# Patient Record
Sex: Female | Born: 1970 | Race: White | Hispanic: No | Smoking: Never smoker
Health system: Southern US, Community
[De-identification: ages and names within clinical notes are randomized; demographics above are authoritative.]

## PROBLEM LIST (undated history)

## (undated) DIAGNOSIS — K219 Gastro-esophageal reflux disease without esophagitis: Secondary | ICD-10-CM

## (undated) DIAGNOSIS — G4733 Obstructive sleep apnea (adult) (pediatric): Secondary | ICD-10-CM

## (undated) DIAGNOSIS — F32A Depression, unspecified: Secondary | ICD-10-CM

## (undated) DIAGNOSIS — F329 Major depressive disorder, single episode, unspecified: Secondary | ICD-10-CM

## (undated) DIAGNOSIS — K7460 Unspecified cirrhosis of liver: Secondary | ICD-10-CM

## (undated) DIAGNOSIS — M1990 Unspecified osteoarthritis, unspecified site: Secondary | ICD-10-CM

## (undated) DIAGNOSIS — R51 Headache: Secondary | ICD-10-CM

## (undated) DIAGNOSIS — I872 Venous insufficiency (chronic) (peripheral): Secondary | ICD-10-CM

## (undated) DIAGNOSIS — D649 Anemia, unspecified: Secondary | ICD-10-CM

## (undated) DIAGNOSIS — Z5320 Procedure and treatment not carried out because of patient's decision for unspecified reasons: Secondary | ICD-10-CM

## (undated) HISTORY — DX: Depression, unspecified: F32.A

## (undated) HISTORY — PX: HERNIA REPAIR: SHX51

## (undated) HISTORY — DX: Major depressive disorder, single episode, unspecified: F32.9

## (undated) HISTORY — DX: Obstructive sleep apnea (adult) (pediatric): G47.33

## (undated) HISTORY — DX: Morbid (severe) obesity due to excess calories: E66.01

## (undated) HISTORY — DX: Venous insufficiency (chronic) (peripheral): I87.2

## (undated) HISTORY — DX: Unspecified cirrhosis of liver: K74.60

## (undated) HISTORY — DX: Unspecified osteoarthritis, unspecified site: M19.90

## (undated) HISTORY — DX: Procedure and treatment not carried out because of patient's decision for unspecified reasons: Z53.20

---
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
## 1991-05-05 HISTORY — PX: UMBILICAL HERNIA REPAIR: SHX196

## 1991-05-05 HISTORY — PX: CHOLECYSTECTOMY: SHX55

## 1996-05-04 HISTORY — PX: TUBAL LIGATION: SHX77

## 1997-05-04 HISTORY — PX: GASTRIC BYPASS: SHX52

## 2012-03-12 ENCOUNTER — Encounter (HOSPITAL_BASED_OUTPATIENT_CLINIC_OR_DEPARTMENT_OTHER): Payer: Self-pay | Admitting: Emergency Medicine

## 2012-03-12 ENCOUNTER — Emergency Department (HOSPITAL_BASED_OUTPATIENT_CLINIC_OR_DEPARTMENT_OTHER)
Admission: EM | Admit: 2012-03-12 | Discharge: 2012-03-12 | Disposition: A | Discharge status: Home / Self Care | Payer: BC Managed Care – PPO | Attending: Emergency Medicine | Admitting: Emergency Medicine

## 2012-03-12 DIAGNOSIS — S025XXA Fracture of tooth (traumatic), initial encounter for closed fracture: Secondary | ICD-10-CM | POA: Insufficient documentation

## 2012-03-12 DIAGNOSIS — Y929 Unspecified place or not applicable: Secondary | ICD-10-CM | POA: Insufficient documentation

## 2012-03-12 DIAGNOSIS — K029 Dental caries, unspecified: Secondary | ICD-10-CM | POA: Insufficient documentation

## 2012-03-12 DIAGNOSIS — Y939 Activity, unspecified: Secondary | ICD-10-CM | POA: Insufficient documentation

## 2012-03-12 DIAGNOSIS — X58XXXA Exposure to other specified factors, initial encounter: Secondary | ICD-10-CM | POA: Insufficient documentation

## 2012-03-12 MED ORDER — HYDROCODONE-ACETAMINOPHEN 5-325 MG PO TABS: 1.0000 | ORAL_TABLET | Freq: Three times a day (TID) | ORAL | Status: DC | PRN

## 2012-03-12 NOTE — ED Notes (Signed)
Pt presents to ED today with left lower dental pain for the last 5 days.  Pt states pain radiates to ear and jaw.  Pt has Primary dentist and did not call or request appt.

## 2012-03-12 NOTE — ED Provider Notes (Signed)
History     CSN: [id number]  Arrival date & time 03/12/12  1028   First MD Initiated Contact with Patient 03/12/12 1121      Chief Complaint  Patient presents with  . Dental Pain    (Consider location/radiation/quality/duration/timing/severity/associated sxs/prior treatment) HPI The patient presents with concerns of ongoing facial pain.  The pain began approximately one week ago.  Since onset there has been pain in her left lower face.  The pain is dull, severe, worse with chewing.  No relief with OTC medication.  Patient denies any concurrent fevers, chills, nausea, vomiting. She notes that she has a fractured tooth in the area, has not been able to obtain dental followup yet. No past medical history on file.  Past Surgical History  Procedure Date  . Cholecystectomy   . Cyst excision   . Cesarean section   . Hernia repair   . Tonsillectomy     No family history on file.  History  Substance Use Topics  . Smoking status: Not on file  . Smokeless tobacco: Not on file  . Alcohol Use:     OB History    Grav Para Term Preterm Abortions TAB SAB Ect Mult Living                  Review of Systems  All other systems reviewed and are negative.    Allergies  Review of patient's allergies indicates no known allergies.  Home Medications  No current outpatient prescriptions on file.  BP 127/75  Pulse 78  Temp 98.5 F (36.9 C) (Oral)  Resp 18  Ht 5\' 5"  (1.651 m)  Wt 239 lb (108.41 kg)  BMI 39.77 kg/m2  SpO2 99%  LMP 03/06/2012  Physical Exam  Nursing note and vitals reviewed. Constitutional: She is oriented to person, place, and time. She appears well-developed and well-nourished. No distress.  HENT:  Head: Normocephalic and atraumatic. No trismus in the jaw.  Mouth/Throat: Uvula is midline. No oral lesions. Dental caries present. No dental abscesses or uvula swelling.    Eyes: Conjunctivae normal and EOM are normal.  Cardiovascular: Normal rate and regular  rhythm.   Pulmonary/Chest: Effort normal and breath sounds normal. No stridor. No respiratory distress.  Abdominal: She exhibits no distension.  Musculoskeletal: She exhibits no edema.  Neurological: She is alert and oriented to person, place, and time. No cranial nerve deficit.  Skin: Skin is warm and dry.  Psychiatric: She has a normal mood and affect.    ED Course  Procedures (including critical care time)  Labs Reviewed - No data to display No results found.   No diagnosis found.    MDM  This generally well-appearing female presents with concerns of ongoing dental pain.  Absent fever, evidence of early infection she was not started on antibiotics, but was provided analgesics, references for dental followup expeditiously definitively treat her lesion.    Gerhard Munch, MD 03/12/12 1143

## 2012-03-12 NOTE — ED Notes (Signed)
Left lower dental pain for several days.  Pain is worsening.

## 2013-06-27 ENCOUNTER — Ambulatory Visit (INDEPENDENT_AMBULATORY_CARE_PROVIDER_SITE_OTHER): Payer: BC Managed Care – PPO | Admitting: General Surgery

## 2013-06-27 ENCOUNTER — Encounter (INDEPENDENT_AMBULATORY_CARE_PROVIDER_SITE_OTHER): Payer: Self-pay | Admitting: General Surgery

## 2013-06-27 VITALS — BP 120/80 | HR 80 | Temp 98.4°F | Resp 14 | Ht 64.0 in | Wt 279.4 lb

## 2013-06-27 DIAGNOSIS — K432 Incisional hernia without obstruction or gangrene: Secondary | ICD-10-CM

## 2013-06-27 NOTE — Patient Instructions (Signed)
Your physical exam confirms that you have an upper abdominal central incisional hernia.  Most likely you should eventually have an operation to repair this with mesh.  Because of your obesity, You are at increased risk, and so we want to make sure we plan this very safely.  You'll be scheduled for a CT scan of the abdomen and pelvis  We are going to obtain your operating room records from [facility] on your gastric bypass and gallbladder surgery  You are strongly advised to establish yourself with a primary care physician to review your medical problems and be sure that they are stabilized  For your constipation I recommended that you drink 6 glasses of water a day and take 4 Metamucil capsules twice a day. If that does not work then [employer] Financialyou'll need to take MiraLAX twice a day.  You will be given an appointment to return to see Dr. Derrell LollingIngram in 3 or 4 weeks after all this is done.

## 2013-06-27 NOTE — Progress Notes (Addendum)
Patient ID: Christina Castro, female   DOB: 1970-12-26, 43 y.o.   MRN: [id number]  Chief Complaint  Patient presents with  . New Evaluation    eval abd hernia    HPI Christina Castro is a 43 y.o. female.  She is referred to me by Dr. Sherian Rein, her gynecologist, for evaluation of an upper abdominal incisional hernia.     The patient has a history of morbid obesity, and is still actually morbidly obese. She states that she used to weigh 400 pounds, now 279 lbs.    She had some type of gastric bypass in [facility] in 1999, details unknown. She had a laparoscopic cholecystectomy and repair of umbilical hernia at [facility] in 1993. She has had 3 cesarean sections. Her weight is stable. She is constipated. She occasionally has some reflux and low-volume vomiting. She notices a progressive bulge in her upper abdomen but it has never been incarcerated. There has been no GI evaluation. There's been no imaging studies.  Comorbidities include morbid obesity, multiple bowel surgeries as listed above. She has never been a smoker. She drinks alcohol rarely. Her daughter is with her today. 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  Family history is notable for breast cancer in mother and breast cancer in maternal grandmother. Uncle had primary malignant neoplasm of prostate and also along neoplasm. Sister may have had ovarian neoplasm but details are unknown. HPI  History reviewed. No pertinent past medical history.  Past Surgical History  Procedure Laterality Date  . Cholecystectomy    . Cyst excision    . Cesarean section    . Hernia repair    . Tonsillectomy    . Gastric bypass      Family History  Problem Relation Age of Onset  . Cancer Mother     breast  . Cancer Father     prostate  . Cancer Sister     ovarian  . Cancer Paternal Uncle     prostate  . Cancer Maternal Grandmother     breast  . Cancer Paternal Grandfather     prostate  . Cancer Sister    ovarian    Social History History  Substance Use Topics  . Smoking status: Never Smoker   . Smokeless tobacco: Never Used  . Alcohol Use: 0.6 oz/week    1 Glasses of wine per week     Comment: monthly    No Known Allergies  No current outpatient prescriptions on file.   No current facility-administered medications for this visit.    Review of Systems Review of Systems  Constitutional: Negative for fever, chills and unexpected weight change.  HENT: Negative for congestion, hearing loss, sore throat, trouble swallowing and voice change.   Eyes: Negative for visual disturbance.  Respiratory: Negative for cough and wheezing.   Cardiovascular: Negative for chest pain, palpitations and leg swelling.  Gastrointestinal: Positive for vomiting and abdominal pain. Negative for nausea, diarrhea, constipation, blood in stool, abdominal distention and anal bleeding.       GERD  Genitourinary: Negative for hematuria, vaginal bleeding and difficulty urinating.  Musculoskeletal: Negative for arthralgias.  Skin: Negative for rash and wound.  Neurological: Negative for seizures, syncope and headaches.  Hematological: Negative for adenopathy. Does not bruise/bleed easily.  Psychiatric/Behavioral: Positive for sleep disturbance. Negative for confusion.    Blood pressure 120/80, pulse 80, temperature 98.4 F (36.9 C), temperature source Oral, resp. rate 14, height 5\' 4"  (1.626 m), weight 279  lb 6.4 oz (126.735 kg).  Physical Exam Physical Exam  Constitutional: She is oriented to person, place, and time. She appears well-developed and well-nourished. No distress.  Present. Cooperative. Daughter with her throughout the encounter. Morbid obese.  HENT:  Head: Normocephalic and atraumatic.  Nose: Nose normal.  Mouth/Throat: No oropharyngeal exudate.  Eyes: Conjunctivae and EOM are normal. Pupils are equal, round, and reactive to light. Left eye exhibits no discharge. No scleral icterus.  Neck:  Neck supple. No JVD present. No tracheal deviation present. No thyromegaly present.  Cardiovascular: Normal rate, regular rhythm, normal heart sounds and intact distal pulses.   No murmur heard. Pulmonary/Chest: Effort normal and breath sounds normal. No respiratory distress. She has no wheezes. She has no rales. She exhibits no tenderness.  Breasts not examined, recently examined by her gynecologist.  Abdominal: Soft. Bowel sounds are normal. She exhibits no distension and no mass. There is no tenderness. There is no rebound and no guarding.  Upper midline incision. Reducible bulge in the upper midline consistent with hernia. Laparoscopic scars, presumably from cholecystectomy. Pfannenstiel incision well healed.  Musculoskeletal: She exhibits no edema and no tenderness.  Lymphadenopathy:    She has no cervical adenopathy.  Neurological: She is alert and oriented to person, place, and time. She exhibits normal muscle tone. Coordination normal.  Skin: Skin is warm. No rash noted. She is not diaphoretic. No erythema. No pallor.  Psychiatric: She has a normal mood and affect. Her behavior is normal. Judgment and thought content normal.    Data Reviewed Dr. Reggie PileStuckert's office notes, which are complete. [facility] and [facility] abdomen.  Assessment    Ventral incisional hernia. I suspect that it would be in her best interest to have this repaired before he gets too much larger. I told her that she was high risk for complications and high risk for recurrence, and she accepts this.  History gastric bypass. Details unknown. This may be contributing to her reflux symptoms  History of cholecystectomy and umbilical hernia repair, details unknown  Morbid obesity  History cesarean section x3  Chronic constipation  Family history of breast cancer     Plan    I told her that we would pursue a preop evaluation and consider a repair of her ventral hernia,  either laparoscopic or open, depending on its size  She'll be scheduled for a CT scan of the abdomen and pelvis with contrast  We will obtain records from [facility] for her cholecystectomy and for a gastric bypass ADDENDUM: Operative [facility] revealed that she had a vertical banded gastroplasty by Dr. Mikey BussingGary Biesecker on 07/10/1997. It also reveals that she had a laparoscopic cholecystectomy with cholangiogram and repair of umbilical hernia with a primary tissue repair on 09/27/1991.  I have strongly urged her to establish a self with her primary care physician to make sure that all her medical problems are stabilized  Return to see me in one month to discuss next steps       [facility] M. Derrell LollingIngram, M.D., [facility] General and Minimally invasive Surgery Breast and Colorectal Surgery Office:   [phone] Pager:   [phone]  06/27/2013, 10:00 AM

## 2013-06-30 ENCOUNTER — Telehealth (INDEPENDENT_AMBULATORY_CARE_PROVIDER_SITE_OTHER): Payer: Self-pay | Admitting: *Deleted

## 2013-06-30 NOTE — Telephone Encounter (Signed)
I spoke with pt and informed her of the appt for her CT scan at [facility] on 07/06/13 with an arrival time of 8:15am.  I instructed her to go by GI to pick up the contrast prior to her appt.  I instructed her to drink the 1st bottle at 6:30am and 2nd bottle at 7:30am.  I informed her to have NO solid foods 4 hours prior to the scan.  She is agreeable with this appt.  Patient is also requesting to transfer her care to another physician due to a personality conflict.  I informed her that I would send this message to Dr. Derrell LollingIngram and his assistant.  She is agreeable at this time.

## 2013-07-06 ENCOUNTER — Ambulatory Visit
Admission: RE | Admit: 2013-07-06 | Discharge: 2013-07-06 | Disposition: A | Discharge status: Home / Self Care | Payer: BC Managed Care – PPO | Source: Ambulatory Visit | Attending: General Surgery | Admitting: General Surgery

## 2013-07-06 DIAGNOSIS — K432 Incisional hernia without obstruction or gangrene: Secondary | ICD-10-CM

## 2013-07-06 MED ORDER — IOHEXOL 300 MG/ML  SOLN
125.0000 mL | Freq: Once | INTRAMUSCULAR | Status: AC | PRN
  Administered 2013-07-06: 125 mL via INTRAVENOUS

## 2013-07-18 ENCOUNTER — Encounter (INDEPENDENT_AMBULATORY_CARE_PROVIDER_SITE_OTHER): Payer: Self-pay | Admitting: General Surgery

## 2013-07-18 ENCOUNTER — Ambulatory Visit (INDEPENDENT_AMBULATORY_CARE_PROVIDER_SITE_OTHER): Payer: BC Managed Care – PPO | Admitting: General Surgery

## 2013-07-18 VITALS — BP 124/88 | HR 72 | Temp 97.6°F | Resp 16 | Ht 64.0 in | Wt 283.4 lb

## 2013-07-18 DIAGNOSIS — K432 Incisional hernia without obstruction or gangrene: Secondary | ICD-10-CM

## 2013-07-18 NOTE — Patient Instructions (Signed)
Your CT scan shows a medium-sized incisional hernia containing only fat. There is also some thickening of the esophagus. The thickening of the esophagus is probably due to reflux, and the Nexium should help.  We spent some time today talking about the indications, techniques, and details of surgery to repair this.  You will be scheduled for a laparoscopic repair of your ventral hernia. This possibly will need to be converted to an open repair for the reasons we discussed.  I recommend that you walk for 1 hour a day to increase your fitness prior to this surgery.     Laparoscopic Ventral Hernia Repair Laparoscopic ventral hernia repairis a surgery to fix a ventral hernia. Aventral hernia, also called an incisional hernia, is a bulge of body tissue or intestines that pushes through the front part of the abdomen. This can happen if the connective tissue covering the muscles over the abdomen has a weak spot or is torn because of a surgical cut (incision) from a previous surgery. Laparoscopic ventral hernia repair is often done soon after diagnosis to stop the hernia from getting bigger, becoming uncomfortable, or becoming an emergency. This surgery usually takes about 2 hours, but the time can vary greatly. LET YOUR CAREGIVER KNOW ABOUT:  Any allergies you have.  All medicines you are taking, including steroids, vitamins, herbs, eyedrops, and over-the-counter medicines and creams.  Previous problems you or members of your family have had with the use of anesthetics.  Any blood disorders or bleeding problems you have had.  Past surgeries you have had.  Other health problems you have. RISKS AND COMPLICATIONS  Generally, laparoscopic ventral hernia repair is a safe procedure. However, as with any surgical procedure, complications can occur. Possible complications include:  Bleeding.  Trouble passing urine or having a bowel movement after the operation.  Infection.  Pneumonia.  Blood  clots.  Pain in the area of the hernia.  A bulge in the area of the hernia that may be caused by a collection of fluid.  Injury to intestines or other structures in the abdomen.  Return of the hernia after surgery. In some cases, the caregiver may need to stop the laparoscopic procedure and do regular, open surgery. This may be necessary for very difficult hernias, when organs are hard to see, or when bleeding problems occur during surgery. BEFORE THE PROCEDURE   You may need to have blood tests, urine tests, a chest X-ray, or electrocardiography done before the day of the surgery.  Ask your caregiver about changing or stopping your regular medicines.  You may need to wash with a special type of germ-killing soap.  Do not eat or drink anything for at least 6 hours before the surgery.  Make plans to have someone drive you home after the procedure. PROCEDURE   Small monitors will be put on your body. They are used to check your heart, blood pressure, and oxygen level.  An intravenous (IV) access tube will be put into a vein in your hand or arm. Fluids and medicine will flow directly into your body through the IV tube.  You will be given medicine to make you sleep through the procedure (general anesthetic).  Your abdomen will be cleaned with a special soap to kill any germs on your skin.  Once you are asleep, several small incisions will be made in your abdomen.  The large space in your abdomen will be filled with air so that it expands. This gives the caregiver more room and  a better view.  A thin, lighted tube with a tiny camera on the end (laparoscope) is put through a small incision in your abdomen. The camera on the laparoscope sends a picture to a TV screen in the operating room. This gives the caregiver a good view inside the abdomen.  Hollow tubes are put through the other small incisions in your abdomen. The tools needed for the procedure are put through these tubes.  The  caregiver puts the tissue or intestines that formed the hernia back in place.  A screen-like patch (mesh) is used to close the hernia. This helps make the area stronger. Stitches, tacks, or staples are used to keep the mesh in place.  Medicine and a bandage (dressing) or skin glue will be put over the incisions. AFTER THE PROCEDURE   You will stay in a recovery area until the anesthetic wears off. Your blood pressure and pulse will be checked often.  You may be able to go home the same day or may need to stay in the hospital for 1 or 2 days after this surgery. Your caregiver will decide when you can go home.  You may feel some pain. You will likely be given medicine for pain.  You will be urged to do breathing exercises that involve taking deep breaths. This helps prevent a lung infection after a surgery.  You may have to wear compression stockings while you are in the hospital. These stockings help keep blood clots from forming in your legs. Document Released: 04/06/2012 Document Reviewed: 04/06/2012 [facility] Patient Information 2014 [place], [place].

## 2013-07-18 NOTE — Progress Notes (Signed)
Patient ID: Christina Castro, female   DOB: 05/15/1970, 43 y.o.   MRN: [id number]  Chief Complaint  Patient presents with  . Routine Post Op    discuss ct/sx? Abd hernia    HPI Christina Castro is a 43 y.o. female.  She returns for further discussion regarding her incisional hernia. She states that she still has pain and would like to have something done.  Her CT scan shows a midline ventral hernia. This contains only fat.The defect is medium-sized and possibly could be closed laparoscopically, but I'm not sure.   She has established herself with Dr. Zollie Beckers 4 for primary care, which is excellent. He has started her on Nexium for her reflux symptoms. He started her on sertraline for depression. Lab work revealed hemoglobin 12.4, creatinine 1.0, albumin 3.5, normal liver function tests. Lipid panel did not look bad.   Glucose 94.  Review of operative summaries from [place] confirmed that she had a laparoscopic cholecystectomy with cholangiogram and repair of umbilical hernia with primary tissue repair on Sep 27, 1991. It also confirmed that she underwent a vertical banded gastroplasty by Dr. Mikey Bussing on 07/10/1997. Discussed this with her.  Initial presentation is as follows. She was referred to me by Dr. Sherian Rein, her gynecologist, for evaluation of an upper abdominal incisional hernia.  The patient has a history of morbid obesity, and is still actually morbidly obese. She states that she used to weigh 400 pounds, now 279 lbs. . She has had 3 cesarean sections. Her weight is stable. She is constipated. Complains of refluxShe occasionally has some reflux and low-volume vomiting. She notices a progressive bulge in her upper abdomen but it has never been incarcerated. There has been no GI evaluation. There's been no imaging studies.  Comorbidities include morbid obesity, multiple bowel surgeries as listed above. She has never been a smoker. She drinks alcohol rarely. Her daughter is with  her today. 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   Family history is notable for breast cancer in mother and breast cancer in maternal grandmother. Uncle had primary malignant neoplasm of prostate and also along neoplasm. Sister may have had ovarian neoplasm but details are unknown.  HPI  Past Medical History  Diagnosis Date  . Depression     Past Surgical History  Procedure Laterality Date  . Cholecystectomy    . Cyst excision    . Cesarean section    . Hernia repair    . Tonsillectomy    . Gastric bypass      Family History  Problem Relation Age of Onset  . Cancer Mother     breast  . Cancer Father     prostate  . Cancer Sister     ovarian  . Cancer Paternal Uncle     prostate  . Cancer Maternal Grandmother     breast  . Cancer Paternal Grandfather     prostate  . Cancer Sister     ovarian    Social History History  Substance Use Topics  . Smoking status: Never Smoker   . Smokeless tobacco: Never Used  . Alcohol Use: 0.6 oz/week    1 Glasses of wine per week     Comment: monthly    No Known Allergies  Current Outpatient Prescriptions  Medication Sig Dispense Refill  . NEXIUM 40 MG capsule       . sertraline (ZOLOFT) 50 MG tablet        No current facility-administered  medications for this visit.    Review of Systems Review of Systems  Constitutional: Negative for fever, chills and unexpected weight change.  HENT: Negative for congestion, hearing loss, sore throat, trouble swallowing and voice change.   Eyes: Negative for visual disturbance.  Respiratory: Negative for cough and wheezing.   Cardiovascular: Negative for chest pain, palpitations and leg swelling.  Gastrointestinal: Positive for abdominal pain and constipation. Negative for nausea, vomiting, diarrhea, blood in stool, abdominal distention and anal bleeding.       GERD  Genitourinary: Negative for hematuria, vaginal bleeding and difficulty urinating.  Musculoskeletal: Negative  for arthralgias.  Skin: Negative for rash and wound.  Neurological: Negative for seizures, syncope and headaches.  Hematological: Negative for adenopathy. Does not bruise/bleed easily.  Psychiatric/Behavioral: Negative for confusion.    Blood pressure 124/88, pulse 72, temperature 97.6 F (36.4 C), temperature source Oral, resp. rate 16, height 5\' 4"  (1.626 m), weight 283 lb 6.4 oz (128.549 kg), last menstrual period 06/16/2013.   Physical Exam   Constitutional: She is oriented to person, place, and time. She appears well-developed and well-nourished. No distress.  Present. Cooperative. Daughter with her throughout the encounter. Morbid obese.  HENT:  Head: Normocephalic and atraumatic.  Nose: Nose normal.  Mouth/Throat: No oropharyngeal exudate.  Eyes: Conjunctivae and EOM are normal. Pupils are equal, round, and reactive to light. Left eye exhibits no discharge. No scleral icterus.  Neck: Neck supple. No JVD present. No tracheal deviation present. No thyromegaly present.  Cardiovascular: Normal rate, regular rhythm, normal heart sounds and intact distal pulses.  No murmur heard.  Pulmonary/Chest: Effort normal and breath sounds normal. No respiratory distress. She has no wheezes. She has no rales. She exhibits no tenderness.  Breasts not examined, recently examined by her gynecologist.  Abdominal: Soft. Bowel sounds are normal. She exhibits no distension and no mass. There is no tenderness. There is no rebound and no guarding.  Upper midline incision. Reducible bulge in the upper midline consistent with hernia. Laparoscopic scars, presumably from cholecystectomy. Pfannenstiel incision well healed.  Musculoskeletal: She exhibits no edema and no tenderness.  Lymphadenopathy:  She has no cervical adenopathy.  Neurological: She is alert and oriented to person, place, and time. She exhibits normal muscle tone. Coordination normal.  Skin: Skin is warm. No rash noted. She is not  diaphoretic. No erythema. No pallor.  Psychiatric: She has a normal mood and affect. Her behavior is normal. Judgment and thought content normal.   Data Reviewed Dr. Carolee RotaPharr's notes and lab work. CT scan. Operative notes from [place].  Assessment    Ventral incisional hernia. I suspect that it would be in her best interest to have this repaired before he gets too much larger.  I told her that she was high risk for complications and high risk for recurrence, and she accepts this.   History vertical banded gastroplasty. . This may be contributing to her reflux symptoms   History of cholecystectomy and umbilical hernia repair  Morbid obesity   History cesarean section x3  Chronic constipation  Family history of breast cancer      Plan    Scheduled for a laparoscopic lyses of adhesions, possible laparoscopic closure of muscular defect and laparoscopic repair with mesh. Possible open  I spent some time talking with her about the decision making that would occur in the operating room and how part of the operation might be done laparoscopically and have part of it might be done open depending on operative  findings. She seems to understand and accept this.  I discussed the indications, details, techniques, and numerous risk of the surgery with the patient and  the patient's daughter. She is aware of the risk of bleeding, infection, recurrence of the hernia(20% or more), injury to the intestine with a major septic complications, cardiac, pulmonary, and thromboembolic problems. She understands these issues well. At this time all her questions were answered. She agrees with this plan.       Angelia Mould. Derrell Lolling, M.D., [facility] General and Minimally invasive Surgery Breast and Colorectal Surgery Office:   [phone] Pager:   [phone]  07/18/2013, 11:01 AM

## 2013-08-01 ENCOUNTER — Telehealth: Payer: Self-pay | Admitting: *Deleted

## 2013-08-01 NOTE — Telephone Encounter (Signed)
Called and left a message for pt to return my call so I can reschedule her genetic appt.

## 2013-08-02 ENCOUNTER — Ambulatory Visit: Payer: BC Managed Care – PPO | Admitting: Dietician

## 2013-08-03 ENCOUNTER — Telehealth: Payer: Self-pay | Admitting: *Deleted

## 2013-08-03 NOTE — Telephone Encounter (Signed)
Pt returned my call & I informed her about Clydie BraunKaren leaving and that we needed to reschedule her genetic appt.  Confirmed 09/20/13 genetic appt w/ pt.

## 2013-08-14 ENCOUNTER — Encounter: Payer: BC Managed Care – PPO | Admitting: Genetic Counselor

## 2013-08-14 ENCOUNTER — Other Ambulatory Visit: Payer: BC Managed Care – PPO

## 2013-08-21 ENCOUNTER — Encounter (INDEPENDENT_AMBULATORY_CARE_PROVIDER_SITE_OTHER): Payer: Self-pay

## 2013-08-29 ENCOUNTER — Encounter (HOSPITAL_COMMUNITY): Payer: Self-pay | Admitting: Pharmacy Technician

## 2013-09-02 NOTE — Pre-Procedure Instructions (Signed)
Christina LefevreMary Castro  09/02/2013   Your procedure is scheduled on:  May 11  [facility] at 05:30 AM.  Call this number if you have problems the morning of surgery: [phone]   Remember:   Do not eat food or drink liquids after midnight.   Take these medicines the morning of surgery with A SIP OF WATER: Nexium   STOP/ Do not take Aspirin, Aleve, Naproxen, Advil, Ibuprofen, Vitamin, Herbs, or Supplements starting today   Do not wear jewelry, make-up or nail polish.  Do not wear lotions, powders, or perfumes. You may wear deodorant.  Do not shave 48 hours prior to surgery. Men may shave face and neck.  Do not bring valuables to the hospital.  [facility] is not responsible for any belongings or valuables.               Contacts, dentures or bridgework may not be worn into surgery.  Leave suitcase in the car. After surgery it may be brought to your room.  For patients admitted to the hospital, discharge time is determined by your treatment team.               Patients discharged the day of surgery will not be allowed to drive home.  Name and phone number of your driver: Family/ Friend  Special Instructions: See [facility] Preparing For Surgery   Please read over the following fact sheets that you were given: Pain Booklet, Coughing and Deep Breathing and Surgical Site Infection Prevention

## 2013-09-02 NOTE — Pre-Procedure Instructions (Signed)
[place] - Preparing for Surgery  Before surgery, you can play an important role.  Because skin is not sterile, your skin needs to be as free of germs as possible.  You can reduce the number of germs on you skin by washing with CHG (chlorahexidine gluconate) soap before surgery.  CHG is an antiseptic cleaner which kills germs and bonds with the skin to continue killing germs even after washing.  Please DO NOT use if you have an allergy to CHG or antibacterial soaps.  If your skin becomes reddened/irritated stop using the CHG and inform your nurse when you arrive at Short Stay.  Do not shave (including legs and underarms) for at least 48 hours prior to the first CHG shower.  You may shave your face.  Please follow these instructions carefully:   1.  Shower with CHG Soap the night before surgery and the morning of Surgery.  2.  If you choose to wash your hair, wash your hair first as usual with your normal shampoo.  3.  After you shampoo, rinse your hair and body thoroughly to remove the shampoo.  4.  Use CHG as you would any other liquid soap.  You can apply CHG directly to the skin and wash gently with scrungie or a clean washcloth.  5.  Apply the CHG Soap to your body ONLY FROM THE NECK DOWN.  Do not use on open wounds or open sores.  Avoid contact with your eyes, ears, mouth and genitals (private parts).  Wash genitals (private parts) with your normal soap.  6.  Wash thoroughly, paying special attention to the area where your surgery will be performed.  7.  Thoroughly rinse your body with warm water from the neck down.  8.  DO NOT shower/wash with your normal soap after using and rinsing off the CHG Soap.  9.  Pat yourself dry with a clean towel.            10.  Wear clean pajamas.            11.  Place clean sheets on your bed the night of your first shower and do not sleep with pets.  Day of Surgery  Do not apply any lotions the morning of surgery.  Please wear clean clothes to the  hospital/surgery center.   

## 2013-09-04 ENCOUNTER — Encounter (HOSPITAL_COMMUNITY): Payer: Self-pay

## 2013-09-04 ENCOUNTER — Other Ambulatory Visit (HOSPITAL_COMMUNITY): Payer: Self-pay | Admitting: *Deleted

## 2013-09-04 ENCOUNTER — Encounter (HOSPITAL_COMMUNITY)
Admission: RE | Admit: 2013-09-04 | Discharge: 2013-09-04 | Disposition: A | Discharge status: Home / Self Care | Payer: BC Managed Care – PPO | Source: Ambulatory Visit | Attending: General Surgery | Admitting: General Surgery

## 2013-09-04 DIAGNOSIS — Z01812 Encounter for preprocedural laboratory examination: Secondary | ICD-10-CM | POA: Insufficient documentation

## 2013-09-04 HISTORY — DX: Anemia, unspecified: D64.9

## 2013-09-04 HISTORY — DX: Headache: R51

## 2013-09-04 HISTORY — DX: Gastro-esophageal reflux disease without esophagitis: K21.9

## 2013-09-04 LAB — CBC
HEMATOCRIT: 36.6 % (ref 36.0–46.0)
Hemoglobin: 12.3 g/dL (ref 12.0–15.0)
MCH: 28 pg (ref 26.0–34.0)
MCHC: 33.6 g/dL (ref 30.0–36.0)
MCV: 83.4 fL (ref 78.0–100.0)
PLATELETS: 388 10*3/uL (ref 150–400)
RBC: 4.39 MIL/uL (ref 3.87–5.11)
RDW: 14.7 % (ref 11.5–15.5)
WBC: 10.7 10*3/uL — ABNORMAL HIGH (ref 4.0–10.5)

## 2013-09-04 LAB — HCG, SERUM, QUALITATIVE: Preg, Serum: NEGATIVE

## 2013-09-04 MED ORDER — CHLORHEXIDINE GLUCONATE 4 % EX LIQD: 1.0000 "application " | Freq: Once | CUTANEOUS | Status: DC

## 2013-09-04 NOTE — Progress Notes (Signed)
Pt's PCP is Dr. Renne CriglerPharr.  Pt denies any history cardiac issues, denies chest pain or sob.

## 2013-09-04 NOTE — Pre-Procedure Instructions (Signed)
Christina LefevreMary Castro  09/04/2013   Your procedure is scheduled on:  Monday, Sep 11, 2013 at 7:30 AM.   [facility] Admitting Office at 5:30 AM.   Call this number if you have problems the morning of surgery: [phone]   Remember:   Do not eat food or drink liquids after midnight Sunday, 09/10/13.   Take these medicines the morning of surgery with A SIP OF WATER: NEXIUM, sertraline (ZOLOFT)    Do not wear jewelry, make-up or nail polish.  Do not wear lotions, powders, or perfumes. You may wear deodorant.  Do not shave 48 hours prior to surgery.  Do not bring valuables to the hospital.  [facility] is not responsible                  for any belongings or valuables.               Contacts, dentures or bridgework may not be worn into surgery.  Leave suitcase in the car. After surgery it may be brought to your room.  For patients admitted to the hospital, discharge time is determined by your                treatment team.               Special Instructions: [place] - Preparing for Surgery  Before surgery, you can play an important role.  Because skin is not sterile, your skin needs to be as free of germs as possible.  You can reduce the number of germs on you skin by washing with CHG (chlorahexidine gluconate) soap before surgery.  CHG is an antiseptic cleaner which kills germs and bonds with the skin to continue killing germs even after washing.  Please DO NOT use if you have an allergy to CHG or antibacterial soaps.  If your skin becomes reddened/irritated stop using the CHG and inform your nurse when you arrive at Short Stay.  Do not shave (including legs and underarms) for at least 48 hours prior to the first CHG shower.  You may shave your face.  Please follow these instructions carefully:   1.  Shower with CHG Soap the night before surgery and the                                morning of Surgery.  2.  If you choose to wash your hair, wash your hair  first as usual with your       normal shampoo.  3.  After you shampoo, rinse your hair and body thoroughly to remove the                      Shampoo.  4.  Use CHG as you would any other liquid soap.  You can apply chg directly       to the skin and wash gently with scrungie or a clean washcloth.  5.  Apply the CHG Soap to your body ONLY FROM THE NECK DOWN.        Do not use on open wounds or open sores.  Avoid contact with your eyes, ears, mouth and genitals (private parts).  Wash genitals (private parts) with your normal soap.  6.  Wash thoroughly, paying special attention to the area where your surgery        will be performed.  7.  Thoroughly  rinse your body with warm water from the neck down.  8.  DO NOT shower/wash with your normal soap after using and rinsing off       the CHG Soap.  9.  Pat yourself dry with a clean towel.            10.  Wear clean pajamas.            11.  Place clean sheets on your bed the night of your first shower and do not        sleep with pets.  Day of Surgery  Do not apply any lotions the morning of surgery.  Please wear clean clothes to the hospital/surgery center.     Please read over the following fact sheets that you were given: Pain Booklet, Coughing and Deep Breathing and Surgical Site Infection Prevention

## 2013-09-08 NOTE — H&P (Signed)
Christina LefevreMary Castro   MRN:  [id number]   Description: 43 year old female  Provider: Ernestene MentionHaywood M Wanda Cellucci, MD  Department: [facility]              Diagnoses      Incisional hernia, without obstruction or gangrene    -  Primary      553.21      Morbid obesity          278.01                 Current Vitals Most recent update: 07/18/2013 10:31 AM by Elliot CousinJennifer Witty, CMA      BP Pulse Temp(Src) Resp Ht Wt      124/88 72 97.6 F (36.4 C) (Oral) 16 5\' 4"  (1.626 m) 283 lb 6.4 oz (128.549 kg)      BMI - 48.62 kg/m2 06/16/2013                Vitals History Recorded             History and Physical    Ernestene MentionHaywood M Ambert Virrueta, M    Status: Signed            Patient ID: Christina LefevreMary Castro, female   DOB: 12/30/1970, 43 y.o.   MRN: [id number]                HPI Christina LefevreMary Haskin is a 43 y.o. female.  She returns for further discussion regarding her incisional hernia. She states that she still has pain and would like to have something done.   Her CT scan shows a midline ventral hernia. This contains only fat.The defect is medium-sized and possibly could be closed laparoscopically, but I'm not sure.    She has established herself with Dr. Merri BrunetteWalter Pharr for primary care, which is excellent. He has started her on Nexium for her reflux symptoms. He started her on sertraline for depression. Lab work revealed hemoglobin 12.4, creatinine 1.0, albumin 3.5, normal liver function tests. Lipid panel did not look bad.   Glucose 94.   Review of operative summaries from [place] confirmed that she had a laparoscopic cholecystectomy with cholangiogram and repair of umbilical hernia with primary tissue repair on Sep 27, 1991. It also confirmed that she underwent a vertical banded gastroplasty by Dr. Mikey BussingGary Biesecker on 07/10/1997. Discussed this with her.   Initial presentation is as follows. She was referred to me by Dr. Sherian ReinJody Bovard Stuckert, her gynecologist, for evaluation of an upper abdominal incisional  hernia.   The patient has a history of morbid obesity, and is still actually morbidly obese. She states that she used to weigh 400 pounds, now 279 lbs. . She has had 3 cesarean sections. Her weight is stable. She is constipated. Complains of reflux. She occasionally has some reflux and low-volume vomiting. She notices a progressive bulge in her upper abdomen but it has never been incarcerated. There has been no GI evaluation. There's been no imaging studies.   Comorbidities include morbid obesity, multiple bowel surgeries as listed above. She has never been a smoker. She drinks alcohol rarely. Her daughter is with her today. She is divorced. 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    Family history is notable for breast cancer in mother and breast cancer in maternal grandmother. Uncle had primary malignant neoplasm of prostate and also along neoplasm. Sister may have had ovarian neoplasm but details are unknown.         Past Medical History  Diagnosis  Date   .  Depression           Past Surgical History   Procedure  Laterality  Date   .  Cholecystectomy       .  Cyst excision       .  Cesarean section       .  Hernia repair       .  Tonsillectomy       .  Gastric bypass             Family History   Problem  Relation  Age of Onset   .  Cancer  Mother         breast   .  Cancer  Father         prostate   .  Cancer  Sister         ovarian   .  Cancer  Paternal Uncle         prostate   .  Cancer  Maternal Grandmother         breast   .  Cancer  Paternal Grandfather         prostate   .  Cancer  Sister         ovarian        Social History History   Substance Use Topics   .  Smoking status:  Never Smoker    .  Smokeless tobacco:  Never Used   .  Alcohol Use:  0.6 oz/week       1 Glasses of wine per week         Comment: monthly        No Known Allergies    Current Outpatient Prescriptions   Medication  Sig  Dispense  Refill   .  NEXIUM 40 MG capsule            .  sertraline (ZOLOFT) 50 MG tablet                       Review of Systems Review of Systems  Constitutional: Negative for fever, chills and unexpected weight change.  HENT: Negative for congestion, hearing loss, sore throat, trouble swallowing and voice change.   Eyes: Negative for visual disturbance.  Respiratory: Negative for cough and wheezing.   Cardiovascular: Negative for chest pain, palpitations and leg swelling.  Gastrointestinal: Positive for abdominal pain and constipation. Negative for nausea, vomiting, diarrhea, blood in stool, abdominal distention and anal bleeding.        GERD  Genitourinary: Negative for hematuria, vaginal bleeding and difficulty urinating.  Musculoskeletal: Negative for arthralgias.  Skin: Negative for rash and wound.  Neurological: Negative for seizures, syncope and headaches.  Hematological: Negative for adenopathy. Does not bruise/bleed easily.  Psychiatric/Behavioral: Negative for confusion.      Blood pressure 124/88, pulse 72, temperature 97.6 F (36.4 C), temperature source Oral, resp. rate 16, height 5\' 4"  (1.626 m), weight 283 lb 6.4 oz (128.549 kg), last menstrual period 06/16/2013.     Physical Exam   Constitutional: She is oriented to person, place, and time. She appears well-developed and well-nourished. No distress.  Present. Cooperative. Daughter with her throughout the encounter. Morbid obese.   HENT:   Head: Normocephalic and atraumatic.   Nose: Nose normal.   Mouth/Throat: No oropharyngeal exudate.   Eyes: Conjunctivae and EOM are normal. Pupils are equal, round, and reactive to light. Left eye exhibits no discharge. No  scleral icterus.   Neck: Neck supple. No JVD present. No tracheal deviation present. No thyromegaly present.   Cardiovascular: Normal rate, regular rhythm, normal heart sounds and intact distal pulses.   No murmur heard.   Pulmonary/Chest: Effort normal and breath sounds normal. No respiratory  distress. She has no wheezes. She has no rales. She exhibits no tenderness.  Breasts not examined, recently examined by her gynecologist.   Abdominal: Soft. Bowel sounds are normal. She exhibits no distension and no mass. There is no tenderness. There is no rebound and no guarding.  Upper midline incision. Reducible bulge in the upper midline consistent with hernia. Laparoscopic scars, presumably from cholecystectomy. Pfannenstiel incision well healed.  Musculoskeletal: She exhibits no edema and no tenderness.  Lymphadenopathy:  She has no cervical adenopathy.  Neurological: She is alert and oriented to person, place, and time. She exhibits normal muscle tone. Coordination normal.   Skin: Skin is warm. No rash noted. She is not diaphoretic. No erythema. No pallor.  Psychiatric: She has a normal mood and affect. Her behavior is normal. Judgment and thought content normal.    Data Reviewed Dr. Carolee Rota notes and lab work. CT scan. Operative notes from [place].   Assessment     Ventral incisional hernia. I suspect that it would be in her best interest to have this repaired before he gets too much larger.   I told her that she was high risk for complications and high risk for recurrence, and she accepts this.    History vertical banded gastroplasty. . This may be contributing to her reflux symptoms    History of cholecystectomy and umbilical hernia repair   Morbid obesity    History cesarean section x3   Chronic constipation   Family history of breast cancer        Plan    Scheduled for a laparoscopic lyses of adhesions, possible laparoscopic closure of muscular defect and laparoscopic repair with mesh. Possible open   I spent some time talking with her about the decision making that would occur in the operating room and how part of the operation might be done laparoscopically and have part of it might be done open depending on operative findings. She seems to understand and  accept this.   I discussed the indications, details, techniques, and numerous risk of the surgery with the patient and  the patient's daughter. She is aware of the risk of bleeding, infection, recurrence of the hernia(20% or more), injury to the intestine with a major septic complications, cardiac, pulmonary, and thromboembolic problems. She understands these issues well. At this time all her questions were answered. She agrees with this plan.          Angelia Mould. Derrell Lolling, M.D., [facility] General and Minimally invasive Surgery Breast and Colorectal Surgery Office:   [phone] Pager:   [phone]

## 2013-09-10 MED ORDER — CEFAZOLIN SODIUM 10 G IJ SOLR
3.0000 g | INTRAMUSCULAR | Status: AC
  Administered 2013-09-11: 3 g via INTRAVENOUS
  Filled 2013-09-10: qty 3000

## 2013-09-11 ENCOUNTER — Encounter (HOSPITAL_COMMUNITY): Payer: BC Managed Care – PPO | Admitting: Anesthesiology

## 2013-09-11 ENCOUNTER — Encounter (HOSPITAL_COMMUNITY): Payer: Self-pay | Admitting: *Deleted

## 2013-09-11 ENCOUNTER — Ambulatory Visit (HOSPITAL_COMMUNITY)
Admission: RE | Admit: 2013-09-11 | Discharge: 2013-09-13 | Disposition: A | Discharge status: Home / Self Care | Payer: BC Managed Care – PPO | Source: Ambulatory Visit | Attending: General Surgery | Admitting: General Surgery

## 2013-09-11 ENCOUNTER — Encounter (HOSPITAL_COMMUNITY)
Admission: RE | Disposition: A | Discharge status: Home / Self Care | Payer: Self-pay | Source: Ambulatory Visit | Attending: General Surgery

## 2013-09-11 ENCOUNTER — Ambulatory Visit (HOSPITAL_COMMUNITY): Payer: BC Managed Care – PPO | Admitting: Anesthesiology

## 2013-09-11 DIAGNOSIS — K660 Peritoneal adhesions (postprocedural) (postinfection): Secondary | ICD-10-CM

## 2013-09-11 DIAGNOSIS — K430 Incisional hernia with obstruction, without gangrene: Secondary | ICD-10-CM | POA: Diagnosis present

## 2013-09-11 DIAGNOSIS — K5900 Constipation, unspecified: Secondary | ICD-10-CM | POA: Insufficient documentation

## 2013-09-11 DIAGNOSIS — K219 Gastro-esophageal reflux disease without esophagitis: Secondary | ICD-10-CM | POA: Insufficient documentation

## 2013-09-11 DIAGNOSIS — Z9884 Bariatric surgery status: Secondary | ICD-10-CM | POA: Insufficient documentation

## 2013-09-11 DIAGNOSIS — Z6841 Body Mass Index (BMI) 40.0 and over, adult: Secondary | ICD-10-CM | POA: Insufficient documentation

## 2013-09-11 HISTORY — PX: LAPAROSCOPIC INCISIONAL / UMBILICAL / VENTRAL HERNIA REPAIR: SUR789

## 2013-09-11 HISTORY — PX: VENTRAL HERNIA REPAIR: SHX424

## 2013-09-11 HISTORY — PX: LYSIS OF ADHESION: SHX5961

## 2013-09-11 HISTORY — PX: INSERTION OF MESH: SHX5868

## 2013-09-11 LAB — CBC
HEMATOCRIT: 35.7 % — AB (ref 36.0–46.0)
Hemoglobin: 11.4 g/dL — ABNORMAL LOW (ref 12.0–15.0)
MCH: 27 pg (ref 26.0–34.0)
MCHC: 31.9 g/dL (ref 30.0–36.0)
MCV: 84.6 fL (ref 78.0–100.0)
Platelets: 336 10*3/uL (ref 150–400)
RBC: 4.22 MIL/uL (ref 3.87–5.11)
RDW: 14.9 % (ref 11.5–15.5)
WBC: 15.6 10*3/uL — AB (ref 4.0–10.5)

## 2013-09-11 LAB — CREATININE, SERUM
Creatinine, Ser: 0.79 mg/dL (ref 0.50–1.10)
GFR calc non Af Amer: >90 mL/min (ref >90)

## 2013-09-11 SURGERY — REPAIR, HERNIA, VENTRAL, LAPAROSCOPIC
Anesthesia: General

## 2013-09-11 MED ORDER — LACTATED RINGERS IV SOLN
INTRAVENOUS | Status: DC | PRN
  Administered 2013-09-11 (×3): via INTRAVENOUS

## 2013-09-11 MED ORDER — EPHEDRINE SULFATE 50 MG/ML IJ SOLN
INTRAMUSCULAR | Status: AC
  Filled 2013-09-11: qty 1

## 2013-09-11 MED ORDER — DEXAMETHASONE SODIUM PHOSPHATE 4 MG/ML IJ SOLN
INTRAMUSCULAR | Status: AC
  Filled 2013-09-11: qty 2

## 2013-09-11 MED ORDER — FENTANYL CITRATE 0.05 MG/ML IJ SOLN
INTRAMUSCULAR | Status: AC
  Filled 2013-09-11: qty 5

## 2013-09-11 MED ORDER — OXYCODONE-ACETAMINOPHEN 5-325 MG PO TABS
ORAL_TABLET | ORAL | Status: AC
  Administered 2013-09-12: 2 via ORAL
  Filled 2013-09-11: qty 2

## 2013-09-11 MED ORDER — CEFAZOLIN SODIUM 10 G IJ SOLR
3.0000 g | Freq: Three times a day (TID) | INTRAMUSCULAR | Status: AC
  Administered 2013-09-11 – 2013-09-12 (×2): 3 g via INTRAVENOUS
  Filled 2013-09-11 (×4): qty 3000

## 2013-09-11 MED ORDER — OXYCODONE HCL 5 MG PO TABS: 5.0000 mg | ORAL_TABLET | Freq: Once | ORAL | Status: DC | PRN

## 2013-09-11 MED ORDER — LIDOCAINE HCL (CARDIAC) 20 MG/ML IV SOLN
INTRAVENOUS | Status: AC
  Filled 2013-09-11: qty 5

## 2013-09-11 MED ORDER — MEPERIDINE HCL 25 MG/ML IJ SOLN: 6.2500 mg | INTRAMUSCULAR | Status: DC | PRN

## 2013-09-11 MED ORDER — SUCCINYLCHOLINE CHLORIDE 20 MG/ML IJ SOLN
INTRAMUSCULAR | Status: AC
  Filled 2013-09-11: qty 1

## 2013-09-11 MED ORDER — HYDROMORPHONE HCL PF 1 MG/ML IJ SOLN
INTRAMUSCULAR | Status: AC
  Administered 2013-09-12: 1 mg via INTRAVENOUS
  Filled 2013-09-11: qty 1

## 2013-09-11 MED ORDER — VECURONIUM BROMIDE 10 MG IV SOLR
INTRAVENOUS | Status: AC
  Filled 2013-09-11: qty 10

## 2013-09-11 MED ORDER — 0.9 % SODIUM CHLORIDE (POUR BTL) OPTIME
TOPICAL | Status: DC | PRN
  Administered 2013-09-11: 1000 mL

## 2013-09-11 MED ORDER — OXYCODONE-ACETAMINOPHEN 5-325 MG PO TABS
1.0000 | ORAL_TABLET | ORAL | Status: DC | PRN
  Administered 2013-09-11 – 2013-09-13 (×8): 2 via ORAL
  Filled 2013-09-11 (×7): qty 2

## 2013-09-11 MED ORDER — ONDANSETRON HCL 4 MG/2ML IJ SOLN
INTRAMUSCULAR | Status: DC | PRN
  Administered 2013-09-11: 4 mg via INTRAVENOUS

## 2013-09-11 MED ORDER — LIDOCAINE HCL (CARDIAC) 20 MG/ML IV SOLN
INTRAVENOUS | Status: DC | PRN
  Administered 2013-09-11: 15 mg via INTRAVENOUS

## 2013-09-11 MED ORDER — MIDAZOLAM HCL 2 MG/2ML IJ SOLN: 0.5000 mg | Freq: Once | INTRAMUSCULAR | Status: DC | PRN

## 2013-09-11 MED ORDER — MIDAZOLAM HCL 5 MG/5ML IJ SOLN
INTRAMUSCULAR | Status: DC | PRN
  Administered 2013-09-11: 2 mg via INTRAVENOUS

## 2013-09-11 MED ORDER — NEOSTIGMINE METHYLSULFATE 10 MG/10ML IV SOLN
INTRAVENOUS | Status: DC | PRN
  Administered 2013-09-11: 3 mg via INTRAVENOUS

## 2013-09-11 MED ORDER — GLYCOPYRROLATE 0.2 MG/ML IJ SOLN
INTRAMUSCULAR | Status: AC
  Filled 2013-09-11: qty 1

## 2013-09-11 MED ORDER — ONDANSETRON HCL 4 MG/2ML IJ SOLN
4.0000 mg | Freq: Four times a day (QID) | INTRAMUSCULAR | Status: DC | PRN
  Administered 2013-09-12: 4 mg via INTRAVENOUS
  Filled 2013-09-11: qty 2

## 2013-09-11 MED ORDER — VECURONIUM BROMIDE 10 MG IV SOLR
INTRAVENOUS | Status: DC | PRN
  Administered 2013-09-11: 2 mg via INTRAVENOUS
  Administered 2013-09-11: 1 mg via INTRAVENOUS

## 2013-09-11 MED ORDER — MIDAZOLAM HCL 2 MG/2ML IJ SOLN
INTRAMUSCULAR | Status: AC
  Filled 2013-09-11: qty 2

## 2013-09-11 MED ORDER — NEOSTIGMINE METHYLSULFATE 10 MG/10ML IV SOLN
INTRAVENOUS | Status: AC
  Filled 2013-09-11: qty 1

## 2013-09-11 MED ORDER — SODIUM CHLORIDE 0.9 % IJ SOLN
INTRAMUSCULAR | Status: AC
  Filled 2013-09-11: qty 10

## 2013-09-11 MED ORDER — STERILE WATER FOR INJECTION IJ SOLN
INTRAMUSCULAR | Status: AC
  Filled 2013-09-11: qty 10

## 2013-09-11 MED ORDER — PHENYLEPHRINE 40 MCG/ML (10ML) SYRINGE FOR IV PUSH (FOR BLOOD PRESSURE SUPPORT)
PREFILLED_SYRINGE | INTRAVENOUS | Status: AC
  Filled 2013-09-11: qty 10

## 2013-09-11 MED ORDER — PROPOFOL 10 MG/ML IV BOLUS
INTRAVENOUS | Status: AC
  Filled 2013-09-11: qty 20

## 2013-09-11 MED ORDER — HYDROMORPHONE HCL PF 1 MG/ML IJ SOLN
0.2500 mg | INTRAMUSCULAR | Status: DC | PRN
  Administered 2013-09-11 (×2): 0.5 mg via INTRAVENOUS

## 2013-09-11 MED ORDER — ROCURONIUM BROMIDE 50 MG/5ML IV SOLN
INTRAVENOUS | Status: AC
  Filled 2013-09-11: qty 1

## 2013-09-11 MED ORDER — DEXAMETHASONE SODIUM PHOSPHATE 4 MG/ML IJ SOLN
INTRAMUSCULAR | Status: DC | PRN
Start: 2013-09-11 — End: 2013-09-11
  Administered 2013-09-11: 8 mg via INTRAVENOUS

## 2013-09-11 MED ORDER — ONDANSETRON HCL 4 MG/2ML IJ SOLN
INTRAMUSCULAR | Status: AC
  Filled 2013-09-11: qty 2

## 2013-09-11 MED ORDER — SERTRALINE HCL 50 MG PO TABS
50.0000 mg | ORAL_TABLET | Freq: Every day | ORAL | Status: DC
  Administered 2013-09-11 – 2013-09-13 (×3): 50 mg via ORAL
  Filled 2013-09-11 (×3): qty 1

## 2013-09-11 MED ORDER — FENTANYL CITRATE 0.05 MG/ML IJ SOLN
INTRAMUSCULAR | Status: DC | PRN
  Administered 2013-09-11 (×5): 50 ug via INTRAVENOUS
  Administered 2013-09-11: 100 ug via INTRAVENOUS
  Administered 2013-09-11 (×3): 50 ug via INTRAVENOUS

## 2013-09-11 MED ORDER — PROPOFOL 10 MG/ML IV BOLUS
INTRAVENOUS | Status: DC | PRN
  Administered 2013-09-11: 120 mg via INTRAVENOUS
  Administered 2013-09-11 (×2): 30 mg via INTRAVENOUS

## 2013-09-11 MED ORDER — PANTOPRAZOLE SODIUM 40 MG PO TBEC
40.0000 mg | DELAYED_RELEASE_TABLET | Freq: Every day | ORAL | Status: DC
  Administered 2013-09-11 – 2013-09-13 (×3): 40 mg via ORAL
  Filled 2013-09-11 (×3): qty 1

## 2013-09-11 MED ORDER — PROMETHAZINE HCL 25 MG/ML IJ SOLN: 6.2500 mg | INTRAMUSCULAR | Status: DC | PRN

## 2013-09-11 MED ORDER — HYDROMORPHONE HCL PF 1 MG/ML IJ SOLN
1.0000 mg | INTRAMUSCULAR | Status: DC | PRN
  Administered 2013-09-11 – 2013-09-12 (×8): 1 mg via INTRAVENOUS
  Filled 2013-09-11 (×10): qty 1

## 2013-09-11 MED ORDER — OXYCODONE HCL 5 MG/5ML PO SOLN: 5.0000 mg | Freq: Once | ORAL | Status: DC | PRN

## 2013-09-11 MED ORDER — ONDANSETRON HCL 4 MG PO TABS: 4.0000 mg | ORAL_TABLET | Freq: Four times a day (QID) | ORAL | Status: DC | PRN

## 2013-09-11 MED ORDER — BUPIVACAINE-EPINEPHRINE (PF) 0.25% -1:200000 IJ SOLN
INTRAMUSCULAR | Status: AC
  Filled 2013-09-11: qty 30

## 2013-09-11 MED ORDER — SUCCINYLCHOLINE CHLORIDE 20 MG/ML IJ SOLN
INTRAMUSCULAR | Status: DC | PRN
  Administered 2013-09-11: 140 mg via INTRAVENOUS

## 2013-09-11 MED ORDER — ENOXAPARIN SODIUM 40 MG/0.4ML ~~LOC~~ SOLN
40.0000 mg | SUBCUTANEOUS | Status: DC
  Administered 2013-09-11 – 2013-09-12 (×2): 40 mg via SUBCUTANEOUS
  Filled 2013-09-11 (×4): qty 0.4

## 2013-09-11 MED ORDER — GLYCOPYRROLATE 0.2 MG/ML IJ SOLN
INTRAMUSCULAR | Status: AC
  Filled 2013-09-11: qty 2

## 2013-09-11 MED ORDER — BUPIVACAINE-EPINEPHRINE 0.25% -1:200000 IJ SOLN
INTRAMUSCULAR | Status: DC | PRN
  Administered 2013-09-11: 22 mL

## 2013-09-11 MED ORDER — ROCURONIUM BROMIDE 100 MG/10ML IV SOLN
INTRAVENOUS | Status: DC | PRN
  Administered 2013-09-11: 30 mg via INTRAVENOUS
  Administered 2013-09-11: 20 mg via INTRAVENOUS

## 2013-09-11 MED ORDER — GLYCOPYRROLATE 0.2 MG/ML IJ SOLN
INTRAMUSCULAR | Status: DC | PRN
  Administered 2013-09-11: 0.4 mg via INTRAVENOUS

## 2013-09-11 SURGICAL SUPPLY — 41 items
APPLICATOR COTTON TIP 6IN STRL (MISCELLANEOUS) ×6 IMPLANT
APPLIER CLIP LOGIC TI 5 (MISCELLANEOUS) IMPLANT
BINDER ABD UNIV 10 28-50 (GAUZE/BANDAGES/DRESSINGS) ×1 IMPLANT
BINDER ABDOM UNIV 10 (GAUZE/BANDAGES/DRESSINGS) ×3
BLADE SURG ROTATE 9660 (MISCELLANEOUS) ×3 IMPLANT
CANISTER SUCTION 2500CC (MISCELLANEOUS) IMPLANT
CHLORAPREP W/TINT 26ML (MISCELLANEOUS) ×3 IMPLANT
COVER SURGICAL LIGHT HANDLE (MISCELLANEOUS) ×3 IMPLANT
DECANTER SPIKE VIAL GLASS SM (MISCELLANEOUS) ×3 IMPLANT
DERMABOND ADVANCED (GAUZE/BANDAGES/DRESSINGS) ×2
DERMABOND ADVANCED .7 DNX12 (GAUZE/BANDAGES/DRESSINGS) ×1 IMPLANT
DEVICE SECURE STRAP 25 ABSORB (INSTRUMENTS) ×3 IMPLANT
DEVICE TROCAR PUNCTURE CLOSURE (ENDOMECHANICALS) ×3 IMPLANT
DRAPE UTILITY 15X26 W/TAPE STR (DRAPE) ×6 IMPLANT
ELECT REM PT RETURN 9FT ADLT (ELECTROSURGICAL) ×3
ELECTRODE REM PT RTRN 9FT ADLT (ELECTROSURGICAL) ×1 IMPLANT
GLOVE EUDERMIC 7 POWDERFREE (GLOVE) ×3 IMPLANT
GOWN STRL REUS W/ TWL LRG LVL3 (GOWN DISPOSABLE) ×2 IMPLANT
GOWN STRL REUS W/ TWL XL LVL3 (GOWN DISPOSABLE) ×3 IMPLANT
GOWN STRL REUS W/TWL LRG LVL3 (GOWN DISPOSABLE) ×4
GOWN STRL REUS W/TWL XL LVL3 (GOWN DISPOSABLE) ×6
KIT BASIN OR (CUSTOM PROCEDURE TRAY) ×3 IMPLANT
KIT ROOM TURNOVER OR (KITS) ×3 IMPLANT
MARKER SKIN DUAL TIP RULER LAB (MISCELLANEOUS) ×3 IMPLANT
MESH VENTRALIGHT ST 8X10 (Mesh General) ×3 IMPLANT
NEEDLE SPNL 22GX3.5 QUINCKE BK (NEEDLE) ×3 IMPLANT
NS IRRIG 1000ML POUR BTL (IV SOLUTION) ×3 IMPLANT
PAD ARMBOARD 7.5X6 YLW CONV (MISCELLANEOUS) ×6 IMPLANT
SCALPEL HARMONIC ACE (MISCELLANEOUS) ×3 IMPLANT
SCISSORS LAP 5X35 DISP (ENDOMECHANICALS) ×3 IMPLANT
SET IRRIG TUBING LAPAROSCOPIC (IRRIGATION / IRRIGATOR) IMPLANT
SLEEVE ENDOPATH XCEL 5M (ENDOMECHANICALS) ×6 IMPLANT
SUT MNCRL AB 4-0 PS2 18 (SUTURE) ×6 IMPLANT
SUT NOVA NAB DX-16 0-1 5-0 T12 (SUTURE) ×3 IMPLANT
TOWEL OR 17X24 6PK STRL BLUE (TOWEL DISPOSABLE) ×3 IMPLANT
TOWEL OR 17X26 10 PK STRL BLUE (TOWEL DISPOSABLE) ×3 IMPLANT
TRAY FOLEY CATH 14FRSI W/METER (CATHETERS) ×3 IMPLANT
TRAY LAPAROSCOPIC (CUSTOM PROCEDURE TRAY) ×3 IMPLANT
TROCAR XCEL NON-BLD 11X100MML (ENDOMECHANICALS) IMPLANT
TROCAR XCEL NON-BLD 5MMX100MML (ENDOMECHANICALS) ×3 IMPLANT
WATER STERILE IRR 1000ML POUR (IV SOLUTION) IMPLANT

## 2013-09-11 NOTE — Anesthesia Procedure Notes (Signed)
Procedure Name: Intubation Date/Time: 09/11/2013 7:39 AM Performed by: Lovie CholOCK, Shylynn Bruning K Pre-anesthesia Checklist: Patient identified, Emergency Drugs available, Suction available, Patient being monitored and Timeout performed Patient Re-evaluated:Patient Re-evaluated prior to inductionOxygen Delivery Method: Circle system utilized Preoxygenation: Pre-oxygenation with 100% oxygen Intubation Type: IV induction, Cricoid Pressure applied and Rapid sequence Laryngoscope Size: Miller and 2 Grade View: Grade I Tube type: Oral Tube size: 7.5 mm Number of attempts: 1 Airway Equipment and Method: Stylet Placement Confirmation: ETT inserted through vocal cords under direct vision,  positive ETCO2,  CO2 detector and breath sounds checked- equal and bilateral Secured at: 22 cm Tube secured with: Tape Dental Injury: Teeth and Oropharynx as per pre-operative assessment

## 2013-09-11 NOTE — Anesthesia Preprocedure Evaluation (Addendum)
Anesthesia Evaluation  Patient identified by MRN, date of birth, ID band Patient awake    Reviewed: Allergy & Precautions, H&P , NPO status , Patient's Chart, lab work & pertinent test results  History of Anesthesia Complications Negative for: history of anesthetic complications  Airway Mallampati: II TM Distance: >3 FB Neck ROM: Full    Dental  (+) Teeth Intact, Dental Advisory Given   Pulmonary neg pulmonary ROS,  breath sounds clear to auscultation  Pulmonary exam normal       Cardiovascular negative cardio ROS  Rhythm:Regular Rate:Normal     Neuro/Psych negative neurological ROS     GI/Hepatic Neg liver ROS, GERD-  Medicated and Poorly Controlled,  Endo/Other  Morbid obesity (s/p lap band)  Renal/GU negative Renal ROS     Musculoskeletal   Abdominal (+) + obese,   Peds  Hematology negative hematology ROS (+)   Anesthesia Other Findings   Reproductive/Obstetrics 09/04/13 Preg test NEG                          Anesthesia Physical Anesthesia Plan  ASA: II  Anesthesia Plan: General   Post-op Pain Management:    Induction: Intravenous and Rapid sequence  Airway Management Planned: Oral ETT  Additional Equipment:   Intra-op Plan:   Post-operative Plan: Extubation in OR  Informed Consent: I have reviewed the patients History and Physical, chart, labs and discussed the procedure including the risks, benefits and alternatives for the proposed anesthesia with the patient or authorized representative who has indicated his/her understanding and acceptance.   Dental advisory given  Plan Discussed with: CRNA and Surgeon  Anesthesia Plan Comments: (Plan routine monitors, GETA)        Anesthesia Quick Evaluation

## 2013-09-11 NOTE — Transfer of Care (Signed)
Immediate Anesthesia Transfer of Care Note  Patient: Jacalyn LefevreMary Gildner  Procedure(s) Performed: Procedure(s): LAPAROSCOPIC VENTRAL HERNIA REPAIR  (N/A) INSERTION OF MESH (N/A) LAPAROSCOPIC LYSIS OF ADHESION (N/A)  Patient Location: PACU  Anesthesia Type:General  Level of Consciousness: awake, alert , oriented and patient cooperative  Airway & Oxygen Therapy: Patient Spontanous Breathing and Patient connected to face mask oxygen  Post-op Assessment: Report given to PACU RN and Post -op Vital signs reviewed and stable  Post vital signs: Reviewed  Complications: No apparent anesthesia complications

## 2013-09-11 NOTE — Anesthesia Postprocedure Evaluation (Signed)
  Anesthesia Post-op Note  Patient: Christina Castro  Procedure(s) Performed: Procedure(s): LAPAROSCOPIC VENTRAL HERNIA REPAIR  (N/A) INSERTION OF MESH (N/A) LAPAROSCOPIC LYSIS OF ADHESION (N/A)  Patient Location: PACU  Anesthesia Type:General  Level of Consciousness: awake, alert , oriented and patient cooperative  Airway and Oxygen Therapy: Patient Spontanous Breathing and Patient connected to nasal cannula oxygen  Post-op Pain: mild  Post-op Assessment: Post-op Vital signs reviewed, Patient's Cardiovascular Status Stable, Respiratory Function Stable, Patent Airway, No signs of Nausea or vomiting and Pain level controlled  Post-op Vital Signs: Reviewed and stable  Last Vitals:  Filed Vitals:   09/11/13 1117  BP: 150/87  Pulse: 73  Temp: 36.6 C  Resp: 16    Complications: No apparent anesthesia complications

## 2013-09-11 NOTE — Interval H&P Note (Signed)
History and Physical Interval Note:  09/11/2013 7:15 AM  Christina LefevreMary Armas  has presented today for surgery, with the diagnosis of ventral incisional hernia  The goals and the various methods of treatment have been discussed with the patient and family. After consideration of risks, benefits and other options for treatment, the patient has consented to  Procedure(s): LAPAROSCOPIC VENTRAL HERNIA REPAIR WITH MESH POSSIBLE OPEN (N/A) INSERTION OF MESH (N/A) as a surgical intervention .  The patient's history has been reviewed, patient examined today, no change in status, stable for surgery.  I have reviewed the patient's chart and labs.  Questions were answered to the patient's satisfaction.     Ernestene MentionHaywood M Jelisa [place]

## 2013-09-11 NOTE — Op Note (Signed)
Patient Name:           Christina Castro   Date of Surgery:        09/11/2013   Note: This dictation was prepared with Dragon/digital dictation along with [facility] technology. Any transcriptional errors that result from this process are unintentional.   Pre op Diagnosis:      Incarcerated ventral incisional hernia  Post op Diagnosis:    Same  Procedure:                 Laparoscopic lysis of adhesions (45 minutes), laparoscopic repair of incarcerated ventral incisional hernia with 25 cm x 20 cm ventralite ST mesh  Surgeon:                     Angelia MouldHaywood M. Derrell LollingIngram, M.D., FACS  Assistant:                      Axel FillerArmando Ramirez, M.D.  Operative Indications:   Christina Castro is a 43 y.o. female. Her PCP is Dr. Merri BrunetteWalter Pharr. She was referred to me by Dr. Sherian ReinJody Bovard Stuckert, her gynecologist, for evaluation of an upper abdominal incisional hernia.  The patient has a history of morbid obesity, and is still actually morbidly obese. She states that she used to weigh 400 pounds, now 279 lbs. . She has had 3 cesarean sections. Her weight is stable. She is constipated. Complains of reflux. She occasionally has some reflux and low-volume vomiting. She notices a progressive bulge in her upper abdomen but it has never been incarcerated.  Her CT scan shows a midline ventral hernia. This contains only fat..  Review of operative summaries from [place] confirmed that she had a laparoscopic cholecystectomy with cholangiogram and repair of umbilical hernia with primary tissue repair on Sep 27, 1991. It also confirmed that she underwent a vertical banded gastroplasty by Dr. Mikey BussingGary Biesecker on 07/10/1997.  Comorbidities include morbid obesity, multiple bowel surgeries as listed above. She has never been a smoker She is brought to the operating room electively  Operative Findings:       She had 3 hernias in the midline between the xiphoid and the upper rim of the umbilicus. The largest hernia was probably 3 or 4 cm  diameter and was very shallow and was in the midline above the umbilicus. The umbilicus looked and felt normal. There was incarcerated omental fat in all the hernias which had to be debrided but there was no involvement of the bowel. No other gross abnormalities were noted. After measuring out all the hernias we found that we could get a 5 cm overlap in all directions if we used a 25 cm vertically by 20 cm transversely mesh.  Procedure in Detail:          Following the induction of general endotracheal anesthesia a Foley catheter was placed. Intravenous antibiotics were given. The abdomen was prepped and draped in a sterile fashion. Surgical time out was performed. 0.5% Marcaine with epinephrine was used as local infiltration anesthetic.     A 5 mm optical trocar was placed in the left subcostal region. Entry was uneventful. Pneumoperitoneum was created. It was inserted. We can see extensive adhesions up and down the midline but ultimately we could see the falciform ligament merging with the adhesions eventually we could see that there was no adhesions in the lower midline although they did extend below the umbilicus somewhat. Careful evaluation was performed and it did not appear  to be any evidence of bleeding or intestinal injury. 12 mm trocar was placed in the left flank. 5 mm trocar in the left lower quadrant. Two 5 mm trocars placed in the right flank. Using cold scissors, cautery scissors, Harmonic Scalpel, traction and countertraction we took down all the adhesions. We had minimal bleeding. No evidence of intestinal involvement. Using a spinal needle and a marking pen we marked the extent of the hernias. We measured this on the abdominal wall and found that a 25 cm x 20 cm mesh would give us 5 cm or greater overlap in all directions. We brought a 25 x 20 cm elliptical Ventralite ST mesh to the operative field.     After marking a template on the abdominal wall I placed 6 suture fixations of #1 Novofil and  the mesh. Very careful to place a sutures so that the rough side of the mesh would be up toward the abdominal wall and  the smooth side down toward the viscera. After placing all the sutures the mesh was moistened, rolled up and inserted. The mesh was opened up and positioned. At each of the suture fixations I made a small puncture wound and using the Endo Close device passed the  sutures up through  the abdominal wall, being careful to take a 1 cm bites of tissue. After all the sutures were placed we reduced the pneumoperitoneum somewhat and lifted up the mesh and it appeared to be positioned very well with almost no redundancy. We tied all the sutures. We then secured the mesh with a Secure Strap device in a double crown technique. On the periphery of the mesh we were very careful to place numerous secure strap firings no more than 1 cm apart. I then made  an inner crown for fixation.      The hernia repair was inspected. It looked well positioned and  secure without any defects. There was no active bleeding. The intestine and omentum were inspected and they looked fine. We removed the trocars and released the pneumoperitoneum. Skin incisions were closed with subcuticular sutures of 4-0 Monocryl and Dermabond. Abdominal binder was placed. The patient was taken to the recovery room in stable condition. EBL 20 cc. Counts correct. Complications none.     Angelia MouldHaywood M. Derrell LollingIngram, M.D., FACS General and Minimally Invasive Surgery Breast and Colorectal Surgery  09/11/2013 9:51 AM

## 2013-09-12 MED ORDER — DOCUSATE SODIUM 100 MG PO CAPS
100.0000 mg | ORAL_CAPSULE | Freq: Two times a day (BID) | ORAL | Status: DC
  Administered 2013-09-12 – 2013-09-13 (×3): 100 mg via ORAL
  Filled 2013-09-12 (×3): qty 1

## 2013-09-12 NOTE — Progress Notes (Signed)
1 Day Post-Op  Subjective: Stable and alert. Quite sore, requiring IV pain medication. Ambulating to the bathroom, voiding without difficulty. No nausea or vomiting. Operative findings and procedure discussed with patient.  Objective: Vital signs in last 24 hours: Temp:  [97.5 F (36.4 C)-98.3 F (36.8 C)] 97.5 F (36.4 C) (05/12 40980608) Pulse Rate:  [73-102] 89 (05/12 0608) Resp:  [15-18] 16 (05/12 0608) BP: (110-150)/(55-87) 114/55 mmHg (05/12 0608) SpO2:  [92 %-96 %] 96 % (05/12 11910608) Weight:  [278 lb (126.1 kg)] 278 lb (126.1 kg) (05/11 1117) Last BM Date: 09/10/13  Intake/Output from previous day: 05/11 0701 - 05/12 0700 In: 2320 [P.O.:220; I.V.:2100] Out: 800 [Urine:800] Intake/Output this shift: Total I/O In: 220 [P.O.:220] Out: 575 [Urine:575]   EXAM: General appearance: alert. Cooperative. Mental status normal.  In no distress. Resp: clear to auscultation bilaterally GI: abdomen obese. Wounds looked good. Not distended. Soft. Mild, appropriate incisional tenderness.  Lab Results:  Results for orders placed during the hospital encounter of 09/11/13 (from the past 24 hour(s))  CBC     Status: Abnormal   Collection Time    09/11/13 11:42 AM      Result Value Ref Range   WBC 15.6 (*) 4.0 - 10.5 K/uL   RBC 4.22  3.87 - 5.11 MIL/uL   Hemoglobin 11.4 (*) 12.0 - 15.0 g/dL   HCT 47.835.7 (*) 29.536.0 - 62.146.0 %   MCV 84.6  78.0 - 100.0 fL   MCH 27.0  26.0 - 34.0 pg   MCHC 31.9  30.0 - 36.0 g/dL   RDW 30.814.9  65.711.5 - 84.615.5 %   Platelets 336  150 - 400 K/uL  CREATININE, SERUM     Status: None   Collection Time    09/11/13 11:42 AM      Result Value Ref Range   Creatinine, Ser 0.79  0.50 - 1.10 mg/dL   GFR calc non Af Amer >90  >90 mL/min   GFR calc Af Amer >90  >90 mL/min     Studies/Results: No results found.  Marland Kitchen.  ceFAZolin (ANCEF) IV  3 g Intravenous 3 times per day  . enoxaparin (LOVENOX) injection  40 mg Subcutaneous Q24H  . pantoprazole  40 mg Oral Daily  . sertraline   50 mg Oral Daily     Assessment/Plan: s/p Procedure(s): LAPAROSCOPIC VENTRAL HERNIA REPAIR  INSERTION OF MESH LAPAROSCOPIC LYSIS OF ADHESION   POD #1. Laparoscopic lysis of adhesions, laparoscopic repair incarcerated ventral hernia with mesh. Stable. Advanced diet and activities. Probable discharge tomorrow  DVT prophylaxis. On Lovenox.  Morbid obesity Status post vertical banded gastroplasty GERD History cholecystectomy and umbilical hernia repair   @PROBHOSP @  LOS: 1 day    Christropher Gintz M. Derrell LollingIngram, M.D., [facility] General and Minimally invasive Surgery Breast and Colorectal Surgery Office:   [phone]   09/12/2013  . .prob

## 2013-09-13 MED ORDER — HYDROCODONE-ACETAMINOPHEN 7.5-325 MG PO TABS: 1.0000 | ORAL_TABLET | Freq: Four times a day (QID) | ORAL | Status: DC | PRN

## 2013-09-13 NOTE — Discharge Planning (Signed)
Patient discharged home in stable condition. Verbalizes understanding of all discharge instructions, including home medications and follow up appointments. 

## 2013-09-13 NOTE — Progress Notes (Signed)
2 Days Post-Op  Subjective: Stable and alert. Afebrile. Vital signs stable.Using Percocet for pain. Says that she vomited yesterday evening approximately 6 clock p.m. Nausea has resolved but she's only been drinking liquids since that time. Says she took Percocet this morning and it did not bother her.Passing flatus but no stool. Ambulatory. Voiding without difficulty.  The emesis was not recorded at only intake and output record. The night nursing staff was unaware that she had vomited. IV fluids appear to have been discontinued.  Objective: Vital signs in last 24 hours: Temp:  [97.5 F (36.4 C)-98.2 F (36.8 C)] 98 F (36.7 C) (05/12 2158) Pulse Rate:  [89-94] 94 (05/12 2158) Resp:  [16-19] 16 (05/12 2158) BP: (108-123)/(55-70) 108/55 mmHg (05/12 2158) SpO2:  [91 %-96 %] 91 % (05/12 2158) Last BM Date: 09/10/13  Intake/Output from previous day: 05/12 0701 - 05/13 0700 In: 237.5  Out: 1700 [Urine:1700] Intake/Output this shift: Total I/O In: -  Out: 250 [Urine:250]  General appearance: alert. Cooperative. Slightly depressed affect. No distress. Resp: clear to auscultation bilaterally GI: obese. Nondistended. Soft. Minimally tender. Wounds looked good. Benign postop exam.  Lab Results:  No results found for this or any previous visit (from the past 24 hour(s)).   Studies/Results: No results found.  . docusate sodium  100 mg Oral BID  . enoxaparin (LOVENOX) injection  40 mg Subcutaneous Q24H  . pantoprazole  40 mg Oral Daily  . sertraline  50 mg Oral Daily     Assessment/Plan: s/p Procedure(s): LAPAROSCOPIC VENTRAL HERNIA REPAIR  INSERTION OF MESH LAPAROSCOPIC LYSIS OF ADHESION  POD #2. Laparoscopic lysis of adhesions, laparoscopic repair incarcerated ventral hernia with mesh.  Stable.  Episode of nausea and emesis last night. Subjectively nausea has resolved. Try hydrocodone. Will try to resume diet and see how she does. Possible discharge today or  tomorrow.  DVT prophylaxis. On Lovenox.   Morbid obesity  Status post vertical banded gastroplasty  GERD  History cholecystectomy and umbilical hernia repair   Angelia MouldHaywood M. Derrell LollingIngram, M.D., [facility] General and Minimally invasive Surgery Breast and Colorectal Surgery Office:   [phone]   @PROBHOSP @  LOS: 2 days    Ernestene MentionHaywood M Bessy Reaney 09/13/2013  . .prob

## 2013-09-13 NOTE — Discharge Instructions (Signed)
See above

## 2013-09-14 ENCOUNTER — Encounter (HOSPITAL_COMMUNITY): Payer: Self-pay | Admitting: General Surgery

## 2013-09-15 NOTE — Discharge Summary (Signed)
Patient ID: Christina Castro [id number] 43 y.o. 07/27/1970  Admit date: 09/11/2013  Discharge date and time: 09/13/2013  3:06 PM  Admitting Physician: Ernestene MentionHaywood M Eleora Sutherland  Discharge Physician: Ernestene MentionHaywood M Tara Rud  Admission Diagnoses: ventral incisional hernia  Discharge Diagnoses: Incarcerated ventral incisional hernia Morbid obesity History of vertical banded gastroplasty GERD History cholecystectomy on the local hernia repair  Operations: Procedure(s): LAPAROSCOPIC VENTRAL HERNIA REPAIR  INSERTION OF MESH LAPAROSCOPIC LYSIS OF ADHESION  Admission Condition: good  Discharged Condition: good  Indication for Admission: Christina LefevreMary Trimarco is a 43 y.o. female. Her PCP is Dr. Merri BrunetteWalter Pharr.  She was referred to me by Dr. Sherian ReinJody Bovard Stuckert, her gynecologist, for evaluation of an upper abdominal incisional hernia.  The patient has a history of morbid obesity, and is still actually morbidly obese. She states that she used to weigh 400 pounds, now 279 lbs. . She has had 3 cesarean sections. Her weight is stable. She is constipated. Complains of reflux. She occasionally has some reflux and low-volume vomiting. She notices a progressive bulge in her upper abdomen but it has never been incarcerated.  Her CT scan shows a midline ventral hernia. This contains only fat..  Review of operative summaries from [place] confirmed that she had a laparoscopic cholecystectomy with cholangiogram and repair of umbilical hernia with primary tissue repair on Sep 27, 1991. It also confirmed that she underwent a vertical banded gastroplasty by Dr. Mikey BussingGary Biesecker on 07/10/1997.  Comorbidities include morbid obesity, multiple bowel surgeries as listed above. She has never been a smoker She is admitted to the hospital electively for surgical repair of her hernia.   Hospital Course: On the day of admission the patient was taken to the operating room and underwent a laparoscopic lysis of extensive adhesions, moderately  extensive, and laparoscopic repair of incarcerated ventral incisional hernia with 25 cm x 20 cm ventralite ST mesh. The surgery was uneventful.     On the first postoperative day she was stable but still requiring IV pain medication. We elected to continue her hospitalization until her pain and came under better control and she became more active. That evening she had one episode of emesis but then her nausea and vomiting resolved and the following morning she resumed diet, Pain was under much better controland ambulation and was ready to go home by midday.On the day of discharge the patient was afebrile, did not appear to be any any significant distress. Her abdomen, although obese, was not distended it was soft and essentially benign and the wounds looked good.     Diet and activities and followup was discussed. Prescription for Percocet was given to patient to take home. She was asked to return to see me in the office in 2-3 weeks.            Consults: None  Significant Diagnostic Studies: None  Treatments: surgery: Laparoscopic lysis of adhesions, laparoscopic repair of ventral incisional hernia with mesh  Disposition: Home  Patient Instructions:    Medication List         NEXIUM 40 MG capsule  Generic drug:  esomeprazole  Take 40 mg by mouth daily.     sertraline 50 MG tablet  Commonly known as:  ZOLOFT  Take 50 mg by mouth daily.        Activity: No sports or heavy lifting for 6 weeks. Okay to drive in 2 weeks or less. Encouraged to walk daily. Diet: low fat, low cholesterol diet Wound Care: as directed  Follow-up:  With Dr. Derrell LollingIngram in 3 weeks.  Signed: Angelia MouldHaywood M. Derrell LollingIngram, M.D., FACS General and minimally invasive surgery Breast and Colorectal Surgery  09/15/2013, 6:51 AM

## 2013-09-18 ENCOUNTER — Other Ambulatory Visit (INDEPENDENT_AMBULATORY_CARE_PROVIDER_SITE_OTHER): Payer: Self-pay

## 2013-09-18 ENCOUNTER — Telehealth (INDEPENDENT_AMBULATORY_CARE_PROVIDER_SITE_OTHER): Payer: Self-pay

## 2013-09-18 DIAGNOSIS — G8918 Other acute postprocedural pain: Secondary | ICD-10-CM

## 2013-09-18 MED ORDER — OXYCODONE-ACETAMINOPHEN 7.5-325 MG PO TABS: 1.0000 | ORAL_TABLET | ORAL | Status: DC | PRN

## 2013-09-18 NOTE — Telephone Encounter (Signed)
Requesting for refill of percocet , rates pain 9 . Advised patient if authorized she will need to pick up before 5pm therefore it maybe tomorrow before it is available.Patient verbalized understanding

## 2013-09-18 NOTE — Telephone Encounter (Signed)
RX for percocet 750/325 #30 signed by Dr Derrell LollingIngram and given to [facility] pt to [facility] up.

## 2013-09-20 ENCOUNTER — Other Ambulatory Visit: Payer: BC Managed Care – PPO

## 2013-09-28 ENCOUNTER — Encounter (INDEPENDENT_AMBULATORY_CARE_PROVIDER_SITE_OTHER): Payer: Self-pay

## 2013-09-28 ENCOUNTER — Ambulatory Visit (INDEPENDENT_AMBULATORY_CARE_PROVIDER_SITE_OTHER): Payer: BC Managed Care – PPO | Admitting: General Surgery

## 2013-09-28 ENCOUNTER — Encounter (INDEPENDENT_AMBULATORY_CARE_PROVIDER_SITE_OTHER): Payer: Self-pay | Admitting: General Surgery

## 2013-09-28 VITALS — BP 112/80 | HR 80 | Temp 97.9°F | Resp 20 | Ht 64.0 in | Wt 273.8 lb

## 2013-09-28 DIAGNOSIS — G8918 Other acute postprocedural pain: Secondary | ICD-10-CM

## 2013-09-28 DIAGNOSIS — K430 Incisional hernia with obstruction, without gangrene: Secondary | ICD-10-CM

## 2013-09-28 MED ORDER — HYDROCODONE-ACETAMINOPHEN 5-325 MG PO TABS: 1.0000 | ORAL_TABLET | Freq: Four times a day (QID) | ORAL | Status: DC | PRN

## 2013-09-28 NOTE — Patient Instructions (Signed)
You are recovering from your laparoscopic incisional hernia repair with mesh without any obvious surgical complications.  You will be given a refill on your Norco prescription at your request.  No sports or lifting more than 20 pounds  Lots of  walking. You may usual elliptical but keep it at a very low level.  Return to see Dr. Derrell Lolling in 6 weeks.

## 2013-09-28 NOTE — Progress Notes (Signed)
Patient ID: Christina Castro, female   DOB: 18-Aug-1970, 43 y.o.   MRN: [id number] History: This patient underwent laparoscopic lysis of adhesions and laparoscopic repair of incarcerated ventral incisional hernia with 25 cm x 20 cm inlay mesh. Date of surgery 09/11/2013. Comorbidities include morbid obesity, history cholecystectomy, umbilical hernia repair, vertical banded gastroplasty.She is doing well. Pain is going away. Ambulatory. Tolerating diet. Normal bowel function.  Exam: Patient is alert and cooperative. No distress Abdomen obese. Soft. Minimal, appropriate tenderness. No fluid collections. Hernia repair intact.  Assessment: Incarcerated ventral incisional hernia, recovering uneventfully in the early postop period following laparoscopic lysis of adhesions and laparoscopic repair with mesh Morbid obesity History cholecystectomy History umbilical hernia repair History of vertical banded gastroplasty  Plan: Diet & activities discussed. No sports or lifting Prescription for Norco, 30 tablets, given at her request Return to see me in 6 weeks.     Angelia Mould. Derrell Lolling, M.D., [facility] General and Minimally invasive Surgery Breast and Colorectal Surgery Office:   [phone] Pager:   [phone]

## 2013-11-09 ENCOUNTER — Ambulatory Visit (INDEPENDENT_AMBULATORY_CARE_PROVIDER_SITE_OTHER): Payer: BC Managed Care – PPO | Admitting: General Surgery

## 2013-11-09 ENCOUNTER — Encounter (INDEPENDENT_AMBULATORY_CARE_PROVIDER_SITE_OTHER): Payer: Self-pay | Admitting: General Surgery

## 2013-11-09 VITALS — BP 128/82 | HR 68 | Temp 98.1°F | Resp 16 | Ht 65.0 in | Wt 275.6 lb

## 2013-11-09 DIAGNOSIS — K432 Incisional hernia without obstruction or gangrene: Secondary | ICD-10-CM

## 2013-11-09 NOTE — Patient Instructions (Signed)
You appear to have recovered from your complex ventral incisional hernia repair without any surgical complications. The repair is intact.  Because of the complexity of the of the surgery, you needed to be completely out of work until June 22. After 10/23/2013, resuming normal activities is appropriate. You should still restrict heavy lifting indefinitely  The right shoulder pain that occurred at home may be due to a rotator cuff injury or some other muscle injury. I advised her to see her orthopedic surgeon to have this better evaluated.  Return to see Dr. Derrell LollingIngram if necessary
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
## 2014-03-06 ENCOUNTER — Other Ambulatory Visit: Payer: Self-pay | Admitting: Rheumatology

## 2014-03-06 DIAGNOSIS — M25511 Pain in right shoulder: Secondary | ICD-10-CM

## 2014-03-21 ENCOUNTER — Other Ambulatory Visit: Payer: BC Managed Care – PPO

## 2014-04-13 ENCOUNTER — Inpatient Hospital Stay: Admission: RE | Admit: 2014-04-13 | Payer: BC Managed Care – PPO | Source: Ambulatory Visit

## 2015-02-25 ENCOUNTER — Other Ambulatory Visit: Payer: Self-pay | Admitting: Gastroenterology

## 2015-02-25 DIAGNOSIS — K219 Gastro-esophageal reflux disease without esophagitis: Secondary | ICD-10-CM

## 2015-02-27 ENCOUNTER — Other Ambulatory Visit: Payer: Self-pay

## 2015-02-28 ENCOUNTER — Ambulatory Visit
Admission: RE | Admit: 2015-02-28 | Discharge: 2015-02-28 | Disposition: A | Discharge status: Home / Self Care | Payer: Managed Care, Other (non HMO) | Source: Ambulatory Visit | Attending: Gastroenterology | Admitting: Gastroenterology

## 2015-02-28 DIAGNOSIS — K219 Gastro-esophageal reflux disease without esophagitis: Secondary | ICD-10-CM

## 2015-06-28 ENCOUNTER — Other Ambulatory Visit: Payer: Self-pay | Admitting: Obstetrics and Gynecology

## 2015-06-28 DIAGNOSIS — R928 Other abnormal and inconclusive findings on diagnostic imaging of breast: Secondary | ICD-10-CM

## 2015-07-03 ENCOUNTER — Other Ambulatory Visit: Payer: Self-pay | Admitting: Obstetrics and Gynecology

## 2015-07-03 ENCOUNTER — Other Ambulatory Visit: Payer: Managed Care, Other (non HMO)

## 2015-07-03 ENCOUNTER — Ambulatory Visit
Admission: RE | Admit: 2015-07-03 | Discharge: 2015-07-03 | Disposition: A | Discharge status: Home / Self Care | Payer: Managed Care, Other (non HMO) | Source: Ambulatory Visit | Attending: Obstetrics and Gynecology | Admitting: Obstetrics and Gynecology

## 2015-07-03 DIAGNOSIS — R928 Other abnormal and inconclusive findings on diagnostic imaging of breast: Secondary | ICD-10-CM

## 2015-07-11 ENCOUNTER — Ambulatory Visit
Admission: RE | Admit: 2015-07-11 | Discharge: 2015-07-11 | Disposition: A | Discharge status: Home / Self Care | Payer: Managed Care, Other (non HMO) | Source: Ambulatory Visit | Attending: Obstetrics and Gynecology | Admitting: Obstetrics and Gynecology

## 2015-07-11 ENCOUNTER — Other Ambulatory Visit: Payer: Self-pay | Admitting: Obstetrics and Gynecology

## 2015-07-11 DIAGNOSIS — R928 Other abnormal and inconclusive findings on diagnostic imaging of breast: Secondary | ICD-10-CM

## 2015-07-25 ENCOUNTER — Ambulatory Visit: Payer: Managed Care, Other (non HMO) | Admitting: Dietician

## 2015-08-14 ENCOUNTER — Encounter: Payer: Managed Care, Other (non HMO) | Attending: Obstetrics and Gynecology | Admitting: Dietician

## 2015-08-14 ENCOUNTER — Encounter: Payer: Self-pay | Admitting: Dietician

## 2015-08-14 DIAGNOSIS — Z6841 Body Mass Index (BMI) 40.0 and over, adult: Secondary | ICD-10-CM | POA: Insufficient documentation

## 2015-08-14 NOTE — Patient Instructions (Signed)
Have a protein shake for dinner if you are not able to eat a full meal. Any sugar free drink is fine. Have a larger breakfast - eggs with grits, fruit, or whole wheat toast (sandwich thins or English muffins).  Have protein with carbs for snacks (see list). When possible, aim to fill half of your plate with non starchy vegetables.  Have splurge or treat foods one or two times per week.  Eat when your stomach is hungry and don't eat when you are not hungry. Eat meals and snacks when possible without distractions (computer, phone, TV). Continue increasing when you can.

## 2015-08-14 NOTE — Progress Notes (Signed)
Medical Nutrition Therapy:  Appt start time: 0745 end time:  0830.   Assessment:  Primary concerns today: Christina Castro is here today since she is trying to lose weight. Lost 100 lbs in 2010 and regained the weight in 2013 when her daughter passed away. Had the gastric bypass in 1999 and weighed over 400 lbs at that time. Weight was around 235 in 2010 lbs which was her lowest adult weight. Was doing Herbalife to lose weight then. Recently has been counting calories, stopped drinking sodas, walking and doing zumba to lose weight. Has lost about 17 lbs since mid February. Was surprised that weight was 308 lbs in February. Was eating things like grits, crackers, gravy, chocolate, icees, 4 sodas per day, and other "junk food".  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 Has trouble getting off work for appointments. Lives with her fiance and stepson.She does the food shopping and meal preparation at home. Does not eat after 7:00 PM due to reflux so she does not eat dinner if she gets home late (most days). Has been trying to eat more frequently. Eats out for lunch sometimes. Avoiding fried foods. Sometimes skips lunch on weekends.   Had an endoscopy last year and it showed that gastric bypass has "opened up again". Food goes down better when she stands up. Cannot eat steak, raw apple/carrots/broccoli, a lot of peanuts, or not crispy bacon. Not taking any vitamins or calcium. Drinking a lot of water. Feels hungry regularly but not sure if it is "real" hunger or not.     Feeling frustrated that weight loss is not faster. Looking into joining a gym with the girls at work.   Preferred Learning Style:   No preference indicated   Learning Readiness:   Ready  MEDICATIONS: see list   DIETARY INTAKE:  Usual eating pattern includes 1-2 meals and 2 snacks per day.  Avoided foods include: steak, raw fruits and vegetables, doesn't eat after 7    24-hr recall:  B ( AM): Herbalife shake and Herbalife tea  (unsweet) or thin bagel half with cream cheese Snk ( AM): grapes/fruit or [employer] Protein bars or granola bars or pea pods or yogurt  L ( PM): [employer] cafeteria baked tilapia with green beans and potato cake or skips lunch on weekend Snk ( PM): grapes/fruit or [employer] Protein bars or granola bars or pea pods or yogurt  D ( PM): none usually Snk ( PM): none Beverages: water or Herbalife aloe tea  Usual physical activity: zumba 2 x week and walking around 7500 steps per day  Estimated energy needs: 1200-1400 calories 135-158 g carbohydrates 90-105 g protein 33-39 g fat  Progress Towards Goal(s):  In progress.   Nutritional Diagnosis:  [place]-3.3 Overweight/obesity As related to large portion sizes, excess carbohydrates, and emotional eating .  As evidenced by BMI of 48.5.    Intervention:  Nutrition counseling provided. Plan: Have a protein shake for dinner if you are not able to eat a full meal. Any sugar free drink is fine. Have a larger breakfast - eggs with grits, fruit, or whole wheat toast (sandwich thins or English muffins).  Have protein with carbs for snacks (see list). When possible, aim to fill half of your plate with non starchy vegetables.  Have splurge or treat foods one or two times per week.  Eat when your stomach is hungry and don't eat when you are not hungry. Eat meals and snacks when possible without distractions (computer, phone,  TV). Continue increasing when you can.   Teaching Method Utilized:  Visual Auditory Hands on  Handouts given during visit include:  Meal card  15 g CHO Snacks  High protein snack foods  Barriers to learning/adherence to lifestyle change: stressful job  Demonstrated degree of understanding via:  Teach Back   Monitoring/Evaluation:  Dietary intake, exercise, and body weight in 1 month(s).

## 2015-09-12 ENCOUNTER — Ambulatory Visit: Payer: Managed Care, Other (non HMO) | Admitting: Dietician

## 2016-06-02 DIAGNOSIS — N920 Excessive and frequent menstruation with regular cycle: Secondary | ICD-10-CM | POA: Insufficient documentation

## 2016-08-18 DIAGNOSIS — Z8742 Personal history of other diseases of the female genital tract: Secondary | ICD-10-CM | POA: Insufficient documentation

## 2018-06-20 ENCOUNTER — Encounter (HOSPITAL_COMMUNITY): Payer: Self-pay | Admitting: Emergency Medicine

## 2018-06-20 ENCOUNTER — Emergency Department (HOSPITAL_COMMUNITY)
Admission: EM | Admit: 2018-06-20 | Discharge: 2018-06-20 | Disposition: A | Discharge status: Home / Self Care | Payer: 59 | Attending: Emergency Medicine | Admitting: Emergency Medicine

## 2018-06-20 DIAGNOSIS — L03211 Cellulitis of face: Secondary | ICD-10-CM | POA: Diagnosis not present

## 2018-06-20 DIAGNOSIS — L0201 Cutaneous abscess of face: Secondary | ICD-10-CM | POA: Diagnosis not present

## 2018-06-20 DIAGNOSIS — L0291 Cutaneous abscess, unspecified: Secondary | ICD-10-CM

## 2018-06-20 MED ORDER — DOXYCYCLINE HYCLATE 100 MG PO CAPS: 100.0000 mg | ORAL_CAPSULE | Freq: Two times a day (BID) | ORAL | 0 refills | Status: AC

## 2018-06-20 MED ORDER — MUPIROCIN CALCIUM 2 % NA OINT: TOPICAL_OINTMENT | NASAL | 0 refills | Status: DC

## 2018-06-20 NOTE — ED Notes (Signed)
Patient verbalizes understanding of discharge instructions. Opportunity for questioning and answers were provided. Armband removed by staff, pt discharged from ED ambulatory w/ husband  

## 2018-06-20 NOTE — Discharge Instructions (Addendum)
You were given a prescription for antibiotics. Please take the antibiotic prescription fully.   Follow-up either at urgent care in the ED in 48 hours for reevaluation of your symptoms.  Please return to the emergency room immediately if you experience any new or worsening symptoms or any symptoms that indicate worsening infection such as fevers, increased redness/swelling/pain, warmth, or drainage from the affected area.

## 2018-06-20 NOTE — ED Provider Notes (Addendum)
[facility] EMERGENCY DEPARTMENT Provider Note   CSN: [id number] Arrival date & time: 06/20/18  1929    History   Chief Complaint Chief Complaint  Patient presents with  . Abscess    HPI Christina Castro is a 48 y.o. female.     HPI  Pt is a 48 y/o female with a h/o asthma, depression, gerd, who presents to the ED today for evaluation of an abscess just inferior to the nose that began 2 days ago. Initially started with some lip swelling and swelling beneath the nose. She has had some redness to the skin, pus drainage and pain. Pain is constant and feels like a burning sensation. Rates pain 10/10. Denies fevers or chills.   She was seen at urgent care 2 days ago and started on amoxicillin but sxs have persisted, they have improved mildly.   Past Medical History:  Diagnosis Date  . 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 Depression    "S/P losing my daughter d/t rejection post lung transplant"  . GERD (gastroesophageal reflux disease)   . Headache(784.0)    "related to starting my periods" (09/11/2013)    Patient Active Problem List   Diagnosis Date Noted  . Incisional hernia with obstruction 09/11/2013  . Incisional hernia, without obstruction or gangrene 06/27/2013  . Morbid obesity (HCC) 06/27/2013    Past Surgical History:  Procedure Laterality Date  . CESAREAN SECTION  1993; 1997; 1998  . CHOLECYSTECTOMY  1993   w/UHR  . CYST EXCISION  1985   "end of my spine"  . GASTRIC BYPASS  1999  . HERNIA REPAIR    . INSERTION OF MESH N/A 09/11/2013   Procedure: INSERTION OF MESH;  Surgeon: Ernestene Mention, MD;  Location: [facility] OR;  Service: General;  Laterality: N/A;  . LAPAROSCOPIC INCISIONAL / UMBILICAL / VENTRAL HERNIA REPAIR  09/11/2013   VHR w/mesh and LOA   . LYSIS OF ADHESION N/A 09/11/2013   Procedure: LAPAROSCOPIC LYSIS OF ADHESION;  Surgeon: Ernestene Mention, MD;  Location: [facility] OR;  Service: General;  Laterality: N/A;  . TONSILLECTOMY  1972  . TUBAL  LIGATION  1998  . UMBILICAL HERNIA REPAIR  1993   w/chole  . VENTRAL HERNIA REPAIR N/A 09/11/2013   Procedure: LAPAROSCOPIC VENTRAL HERNIA REPAIR ;  Surgeon: Ernestene Mention, MD;  Location: [facility] OR;  Service: General;  Laterality: N/A;     OB History   No obstetric history on file.      Home Medications    Prior to Admission medications   Medication Sig Start Date End Date Taking? Authorizing Provider  doxycycline (VIBRAMYCIN) 100 MG capsule Take 1 capsule (100 mg total) by mouth 2 (two) times daily for 7 days. 06/20/18 06/27/18  Anay Walter S, PA-C  esomeprazole (NEXIUM) 40 MG capsule Take 40 mg by mouth daily at 12 noon.    [provider]  mupirocin nasal ointment (BACTROBAN) 2 % Apply below each nostril daily 06/20/18   Jaryd Drew S, PA-C  pantoprazole (PROTONIX) 40 MG tablet daily. 10/27/13   [provider]  sertraline (ZOLOFT) 50 MG tablet Take 50 mg by mouth daily.  07/04/13   [provider]    Family History Family History  Problem Relation Age of Onset  . Cancer Mother        breast  . Cancer Father        prostate  . Cancer Sister  ovarian  . Cancer Paternal Uncle        prostate  . Cancer Maternal Grandmother        breast  . Cancer Paternal Grandfather        prostate  . Cancer Sister        ovarian    Social History Social History   Tobacco Use  . Smoking status: Never Smoker  . Smokeless tobacco: Never Used  Substance Use Topics  . Alcohol use: Yes    Comment: 09/11/2013 "glass of wine/month maybe"  . Drug use: No     Allergies   Patient has no known allergies.   Review of Systems Review of Systems  Constitutional: Negative for chills and fever.  Skin: Positive for color change and wound.       Abscess just beneath the nose     Physical Exam Updated Vital Signs BP 106/83 (BP Location: Right Arm)   Pulse 80   Temp 98.3 F (36.8 C) (Oral)   Resp 18   SpO2 97%   Physical Exam Vitals signs and  nursing note reviewed.  Constitutional:      General: She is not in acute distress.    Appearance: She is well-developed.  HENT:     Head: Normocephalic and atraumatic.  Eyes:     Conjunctiva/sclera: Conjunctivae normal.  Neck:     Musculoskeletal: Neck supple.  Cardiovascular:     Rate and Rhythm: Normal rate.  Pulmonary:     Effort: Pulmonary effort is normal.  Musculoskeletal: Normal range of motion.  Skin:    General: Skin is warm and dry.     Comments: Patient has 1 cm x 2 cm area of erythema and induration just below the left nostril.  There are several punctate areas with active pus drainage.  Area is tender to palpation. Lip is mildly swollen but not significantly tender.  No large area of fluctuance. (pictured below)  Neurological:     Mental Status: She is alert.        ED Treatments / Results  Labs (all labs ordered are listed, but only abnormal results are displayed) Labs Reviewed - No data to display  EKG None  Radiology No results found.  Procedures Procedures (including critical care time)  Medications Ordered in ED Medications - No data to display   Initial Impression / Assessment and Plan / ED Course  I have reviewed the triage vital signs and the nursing notes.  Pertinent labs & imaging results that were available during my care of the patient were reviewed by me and considered in my medical decision making (see chart for details).        Final Clinical Impressions(s) / ED Diagnoses   Final diagnoses:  Abscess   .Patient with an area of cellulitis just below the left nostril that is actively draining a small amount of pus.  There is no large area of fluctuance that would be appropriate for drainage at this time.  Discussed possibility of incision and drainage however because this infection is on the face and is already actively draining, patient and his agreement to defer I&D at this time and instead continue antibiotic therapy and warm  compresses at home.  She is currently on amoxicillin, I changed her antibiotic to doxycycline and also added mupirocin cream.  Wound recheck in 2 days. Encouraged home warm soaks and flushing.   Will d/c to home.    ED Discharge Orders  Ordered    doxycycline (VIBRAMYCIN) 100 MG capsule  2 times daily     06/20/18 2243    mupirocin nasal ointment (BACTROBAN) 2 %     06/20/18 2243           Nicloe Frontera S, PA-C 06/20/18 2249    Racquel Arkin S, PA-C 06/20/18 2306    Derwood KaplanNanavati, Ankit, MD 06/21/18 [phone]

## 2018-06-20 NOTE — ED Triage Notes (Signed)
Pt presents with an abscess to her left upper lip that started Thursday. She was seen at [facility] and they prescribed amoxicillin, pt reports that today she had increased swelling and that it "pop" today and had yellow drainage. She states that the swelling is less that it was this morning but she's worried the abx aren't effective.

## 2018-06-23 ENCOUNTER — Encounter (HOSPITAL_COMMUNITY): Payer: Self-pay | Admitting: Emergency Medicine

## 2018-06-23 ENCOUNTER — Ambulatory Visit (HOSPITAL_COMMUNITY)
Admission: EM | Admit: 2018-06-23 | Discharge: 2018-06-23 | Disposition: A | Discharge status: Home / Self Care | Payer: 59 | Attending: Internal Medicine | Admitting: Internal Medicine

## 2018-06-23 ENCOUNTER — Other Ambulatory Visit: Payer: Self-pay

## 2018-06-23 DIAGNOSIS — L0201 Cutaneous abscess of face: Secondary | ICD-10-CM | POA: Diagnosis not present

## 2018-06-23 DIAGNOSIS — Z5189 Encounter for other specified aftercare: Secondary | ICD-10-CM | POA: Diagnosis not present

## 2018-06-23 NOTE — ED Triage Notes (Signed)
PT has a wound on left upper lip that started as a small pimple. PT was put on amoxicillin Friday and went to the ED Monday due to worsening. PT was placed on a new antibiotic and reports it has improved a lot. PT here for wound check / FU

## 2018-06-23 NOTE — ED Provider Notes (Signed)
[facility]    CSN: [id number] Arrival date & time: 06/23/18  8115     History   Chief Complaint Chief Complaint  Patient presents with  . Wound Check    HPI Christina Castro is a 48 y.o. female is here for follow-up on left upper lip abscess.  Patient was seen in this urgent care on 2/17 for upper lip abscess.  She was prescribed doxycycline and sent home.  Patient comes back for follow-up visit.  Upper lip swelling and drainage has improved.  Patient says the pain to palpation has improved significantly.  Discharge from the upper lip is decreased significantly as well.  No fever or chills.  No nausea vomiting.  HPI  Past Medical History:  Diagnosis Date  . 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 Depression    "S/P losing my daughter d/t rejection post lung transplant"  . GERD (gastroesophageal reflux disease)   . Headache(784.0)    "related to starting my periods" (09/11/2013)    Patient Active Problem List   Diagnosis Date Noted  . Incisional hernia with obstruction 09/11/2013  . Incisional hernia, without obstruction or gangrene 06/27/2013  . Morbid obesity (HCC) 06/27/2013    Past Surgical History:  Procedure Laterality Date  . CESAREAN SECTION  1993; 1997; 1998  . CHOLECYSTECTOMY  1993   w/UHR  . CYST EXCISION  1985   "end of my spine"  . GASTRIC BYPASS  1999  . HERNIA REPAIR    . INSERTION OF MESH N/A 09/11/2013   Procedure: INSERTION OF MESH;  Surgeon: Ernestene Mention, MD;  Location: [facility] OR;  Service: General;  Laterality: N/A;  . LAPAROSCOPIC INCISIONAL / UMBILICAL / VENTRAL HERNIA REPAIR  09/11/2013   VHR w/mesh and LOA   . LYSIS OF ADHESION N/A 09/11/2013   Procedure: LAPAROSCOPIC LYSIS OF ADHESION;  Surgeon: Ernestene Mention, MD;  Location: [facility] OR;  Service: General;  Laterality: N/A;  . TONSILLECTOMY  1972  . TUBAL LIGATION  1998  . UMBILICAL HERNIA REPAIR  1993   w/chole  . VENTRAL HERNIA REPAIR N/A 09/11/2013   Procedure: LAPAROSCOPIC VENTRAL  HERNIA REPAIR ;  Surgeon: Ernestene Mention, MD;  Location: [facility] OR;  Service: General;  Laterality: N/A;    OB History   No obstetric history on file.      Home Medications    Prior to Admission medications   Medication Sig Start Date End Date Taking? Authorizing Provider  doxycycline (VIBRAMYCIN) 100 MG capsule Take 1 capsule (100 mg total) by mouth 2 (two) times daily for 7 days. 06/20/18 06/27/18 Yes Couture, Cortni S, PA-C  mupirocin nasal ointment (BACTROBAN) 2 % Apply below each nostril daily 06/20/18  Yes Couture, Cortni S, PA-C  esomeprazole (NEXIUM) 40 MG capsule Take 40 mg by mouth daily at 12 noon.    [provider]  pantoprazole (PROTONIX) 40 MG tablet daily. 10/27/13   [provider]  sertraline (ZOLOFT) 50 MG tablet Take 50 mg by mouth daily.  07/04/13   [provider]    Family History Family History  Problem Relation Age of Onset  . Cancer Mother        breast  . Cancer Father        prostate  . Cancer Sister        ovarian  . Cancer Paternal Uncle        prostate  . Cancer Maternal Grandmother  breast  . Cancer Paternal Grandfather        prostate  . Cancer Sister        ovarian    Social History Social History   Tobacco Use  . Smoking status: Never Smoker  . Smokeless tobacco: Never Used  Substance Use Topics  . Alcohol use: Yes    Comment: 09/11/2013 "glass of wine/month maybe"  . Drug use: No     Allergies   Patient has no known allergies.   Review of Systems Review of Systems  Gastrointestinal: Negative for abdominal distention, abdominal pain, nausea and vomiting.  Skin: Positive for rash and wound. Negative for pallor.  Neurological: Negative for dizziness, weakness and headaches.     Physical Exam Triage Vital Signs ED Triage Vitals  Enc Vitals Group     BP 06/23/18 1027 134/78     Pulse Rate 06/23/18 1027 79     Resp 06/23/18 1027 18     Temp 06/23/18 1027 98.4 F (36.9 C)     Temp Source  06/23/18 1027 Oral     SpO2 06/23/18 1027 99 %     Weight --      Height --      Head Circumference --      Peak Flow --      Pain Score 06/23/18 1043 0     Pain Loc --      Pain Edu? --      Excl. in GC? --    No data found.  Updated Vital Signs BP 134/78 (BP Location: Left Arm)   Pulse 79   Temp 98.4 F (36.9 C) (Oral)   Resp 18   SpO2 99%   Visual Acuity Right Eye Distance:   Left Eye Distance:   Bilateral Distance:    Right Eye Near:   Left Eye Near:    Bilateral Near:     Physical Exam Vitals signs and nursing note reviewed.  Constitutional:      Appearance: Normal appearance.  HENT:     Right Ear: Tympanic membrane normal.     Left Ear: Tympanic membrane normal.     Nose: Nose normal.     Comments: Upper lip swelling with a scab over the left upper lip    Mouth/Throat:     Mouth: Mucous membranes are moist.     Pharynx: No oropharyngeal exudate or posterior oropharyngeal erythema.  Pulmonary:     Effort: Pulmonary effort is normal.     Breath sounds: Normal breath sounds.  Abdominal:     General: Bowel sounds are normal.     Palpations: Abdomen is soft.  Skin:    General: Skin is warm.     Capillary Refill: Capillary refill takes less than 2 seconds.     Findings: No erythema or rash.  Neurological:     Mental Status: She is alert.        UC Treatments / Results  Labs (all labs ordered are listed, but only abnormal results are displayed) Labs Reviewed - No data to display  EKG None  Radiology No results found.  Procedures Procedures (including critical care time)  Medications Ordered in UC Medications - No data to display  Initial Impression / Assessment and Plan / UC Course  I have reviewed the triage vital signs and the nursing notes.  Pertinent labs & imaging results that were available during my care of the patient were reviewed by me and considered in my medical decision making (see  chart for details).     1.  Abscess of  the upper lip: Continue doxycycline Abscess seems to have drained No indication for residual abscess  Final Clinical Impressions(s) / UC Diagnoses   Final diagnoses:  Visit for wound check   Discharge Instructions   None    ED Prescriptions    None     Controlled Substance Prescriptions [place] Controlled Substance Registry consulted? No   Merrilee JanskyLamptey,  O, MD 06/23/18 1536

## 2018-07-11 ENCOUNTER — Encounter (HOSPITAL_COMMUNITY): Payer: Self-pay | Admitting: Emergency Medicine

## 2018-07-11 ENCOUNTER — Ambulatory Visit (HOSPITAL_COMMUNITY)
Admission: EM | Admit: 2018-07-11 | Discharge: 2018-07-11 | Disposition: A | Discharge status: Home / Self Care | Payer: 59 | Attending: Family Medicine | Admitting: Family Medicine

## 2018-07-11 DIAGNOSIS — B354 Tinea corporis: Secondary | ICD-10-CM

## 2018-07-11 DIAGNOSIS — J069 Acute upper respiratory infection, unspecified: Secondary | ICD-10-CM

## 2018-07-11 DIAGNOSIS — B9789 Other viral agents as the cause of diseases classified elsewhere: Secondary | ICD-10-CM

## 2018-07-11 MED ORDER — BENZONATATE 100 MG PO CAPS: 100.0000 mg | ORAL_CAPSULE | Freq: Three times a day (TID) | ORAL | 0 refills | Status: DC

## 2018-07-11 MED ORDER — CETIRIZINE-PSEUDOEPHEDRINE ER 5-120 MG PO TB12
1.0000 | ORAL_TABLET | Freq: Every day | ORAL | 0 refills | Status: DC
Start: 2018-07-11 — End: 2020-05-24

## 2018-07-11 MED ORDER — CLOTRIMAZOLE 1 % EX CREA: 1.0000 "application " | TOPICAL_CREAM | Freq: Two times a day (BID) | CUTANEOUS | 0 refills | Status: DC

## 2018-07-11 NOTE — Discharge Instructions (Signed)
I believe you have a viral upper respiratory infection Tessalon Perles for cough and Zyrtec-D for nasal congestion and drainage We will try the clotrimazole cream twice a day for the spot on your eyelid Follow up as needed for continued or worsening symptoms

## 2018-07-11 NOTE — ED Triage Notes (Signed)
Pt here for URI sx x 3 days  

## 2018-07-11 NOTE — ED Provider Notes (Signed)
[facility]    CSN: [id number] Arrival date & time: 07/11/18  0900     History   Chief Complaint Chief Complaint  Patient presents with  . URI    HPI Christina Castro is a 48 y.o. female.   Patient is a 48 year old female with past medical history of anemia, depression, GERD.  She presents with approximate 3 days of cough, chest congestion, sinus congestion, rhinorrhea.  Symptoms have been constant.  She is reporting yellow mucus from nasal passages.  She has been using over-the-counter cough and cold medications with no relief of her symptoms.  Reports hacking cough at night.  No fevers, chills, myalgias.  No recent sick contacts or recent traveling.  She also has a rash to the right upper eyelid that is been there for a few months.  The rash is somewhat itchy at times.  She has been using over-the-counter hydrocortisone cream without much relief.  No troubles with her vision.  Denies any fever, joint pain. Denies any recent changes in lotions, detergents, foods or other possible irritants. No recent travel. Nobody else at home has the rash. Patient has been outside but denies any contact with plants or insects. No new foods or medications.   ROS per HPI      Past Medical History:  Diagnosis Date  . 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 Depression    "S/P losing my daughter d/t rejection post lung transplant"  . GERD (gastroesophageal reflux disease)   . Headache(784.0)    "related to starting my periods" (09/11/2013)    Patient Active Problem List   Diagnosis Date Noted  . Incisional hernia with obstruction 09/11/2013  . Incisional hernia, without obstruction or gangrene 06/27/2013  . Morbid obesity (HCC) 06/27/2013    Past Surgical History:  Procedure Laterality Date  . CESAREAN SECTION  1993; 1997; 1998  . CHOLECYSTECTOMY  1993   w/UHR  . CYST EXCISION  1985   "end of my spine"  . GASTRIC BYPASS  1999  . HERNIA REPAIR    . INSERTION OF MESH N/A  09/11/2013   Procedure: INSERTION OF MESH;  Surgeon: Ernestene Mention, MD;  Location: [facility] OR;  Service: General;  Laterality: N/A;  . LAPAROSCOPIC INCISIONAL / UMBILICAL / VENTRAL HERNIA REPAIR  09/11/2013   VHR w/mesh and LOA   . LYSIS OF ADHESION N/A 09/11/2013   Procedure: LAPAROSCOPIC LYSIS OF ADHESION;  Surgeon: Ernestene Mention, MD;  Location: [facility] OR;  Service: General;  Laterality: N/A;  . TONSILLECTOMY  1972  . TUBAL LIGATION  1998  . UMBILICAL HERNIA REPAIR  1993   w/chole  . VENTRAL HERNIA REPAIR N/A 09/11/2013   Procedure: LAPAROSCOPIC VENTRAL HERNIA REPAIR ;  Surgeon: Ernestene Mention, MD;  Location: [facility] OR;  Service: General;  Laterality: N/A;    OB History   No obstetric history on file.      Home Medications    Prior to Admission medications   Medication Sig Start Date End Date Taking? Authorizing Provider  benzonatate (TESSALON) 100 MG capsule Take 1 capsule (100 mg total) by mouth every 8 (eight) hours. 07/11/18   Jaydah Stahle, Gloris Manchester A, NP  cetirizine-pseudoephedrine (ZYRTEC-D) 5-120 MG tablet Take 1 tablet by mouth daily. 07/11/18   Dahlia Byes A, NP  clotrimazole (LOTRIMIN) 1 % cream Apply 1 application topically 2 (two) times daily. 07/11/18   Dahlia Byes A, NP  esomeprazole (NEXIUM) 40 MG capsule Take 40 mg by mouth daily  at 12 noon.    [provider]  mupirocin nasal ointment (BACTROBAN) 2 % Apply below each nostril daily 06/20/18   Couture, Cortni S, PA-C  pantoprazole (PROTONIX) 40 MG tablet daily. 10/27/13   [provider]  sertraline (ZOLOFT) 50 MG tablet Take 50 mg by mouth daily.  07/04/13   [provider]    Family History Family History  Problem Relation Age of Onset  . Cancer Mother        breast  . Cancer Father        prostate  . Cancer Sister        ovarian  . Cancer Paternal Uncle        prostate  . Cancer Maternal Grandmother        breast  . Cancer Paternal Grandfather        prostate  . Cancer Sister        ovarian     Social History Social History   Tobacco Use  . Smoking status: Never Smoker  . Smokeless tobacco: Never Used  Substance Use Topics  . Alcohol use: Yes    Comment: 09/11/2013 "glass of wine/month maybe"  . Drug use: No     Allergies   Patient has no known allergies.   Review of Systems Review of Systems   Physical Exam Triage Vital Signs ED Triage Vitals  Enc Vitals Group     BP 07/11/18 1015 115/79     Pulse Rate 07/11/18 1015 88     Resp 07/11/18 1015 18     Temp 07/11/18 1015 97.9 F (36.6 C)     Temp Source 07/11/18 1015 Oral     SpO2 07/11/18 1015 98 %     Weight --      Height --      Head Circumference --      Peak Flow --      Pain Score 07/11/18 1017 0     Pain Loc --      Pain Edu? --      Excl. in GC? --    No data found.  Updated Vital Signs BP 115/79 (BP Location: Left Arm)   Pulse 88   Temp 97.9 F (36.6 C) (Oral)   Resp 18   SpO2 98%   Visual Acuity Right Eye Distance:   Left Eye Distance:   Bilateral Distance:    Right Eye Near:   Left Eye Near:    Bilateral Near:     Physical Exam Vitals signs and nursing note reviewed.  Constitutional:      General: She is not in acute distress.    Appearance: Normal appearance. She is normal weight. She is not ill-appearing, toxic-appearing or diaphoretic.  HENT:     Head: Normocephalic and atraumatic.     Right Ear: Tympanic membrane and ear canal normal.     Left Ear: Tympanic membrane and ear canal normal.     Nose: Congestion and rhinorrhea present.     Mouth/Throat:     Pharynx: Oropharynx is clear.  Eyes:     Conjunctiva/sclera: Conjunctivae normal.     Pupils: Pupils are equal, round, and reactive to light.     Comments: Raised, circular rash with mild erythema noted to right upper lid Mild scaling.   Neck:     Musculoskeletal: Normal range of motion and neck supple.  Cardiovascular:     Rate and Rhythm: Normal rate and regular rhythm.     Pulses: Normal  pulses.     Heart  sounds: Normal heart sounds.  Pulmonary:     Effort: Pulmonary effort is normal.     Breath sounds: Normal breath sounds.  Musculoskeletal: Normal range of motion.  Skin:    General: Skin is warm and dry.     Findings: Rash present.  Neurological:     Mental Status: She is alert.  Psychiatric:        Mood and Affect: Mood normal.      UC Treatments / Results  Labs (all labs ordered are listed, but only abnormal results are displayed) Labs Reviewed - No data to display  EKG None  Radiology No results found.  Procedures Procedures (including critical care time)  Medications Ordered in UC Medications - No data to display  Initial Impression / Assessment and Plan / UC Course  I have reviewed the triage vital signs and the nursing notes.  Pertinent labs & imaging results that were available during my care of the patient were reviewed by me and considered in my medical decision making (see chart for details).     Viral URI-we will give Tessalon Perles for worsening cough Zyrtec-D for nasal congestion, drainage and postnasal drip  Tinea corporis-clotrimazole cream twice a day Follow up as needed for continued or worsening symptoms  Final Clinical Impressions(s) / UC Diagnoses   Final diagnoses:  Tinea corporis  Viral URI with cough     Discharge Instructions     I believe you have a viral upper respiratory infection Tessalon Perles for cough and Zyrtec-D for nasal congestion and drainage We will try the clotrimazole cream twice a day for the spot on your eyelid Follow up as needed for continued or worsening symptoms     ED Prescriptions    Medication Sig Dispense Auth. Provider   clotrimazole (LOTRIMIN) 1 % cream Apply 1 application topically 2 (two) times daily. 30 g Jeananne Bedwell A, NP   benzonatate (TESSALON) 100 MG capsule Take 1 capsule (100 mg total) by mouth every 8 (eight) hours. 21 capsule Azam Gervasi A, NP   cetirizine-pseudoephedrine (ZYRTEC-D)  5-120 MG tablet Take 1 tablet by mouth daily. 30 tablet Dahlia ByesBast, Tyshea Imel A, NP     Controlled Substance Prescriptions [place] Controlled Substance Registry consulted? Not Applicable   Janace ArisBast, Dakoda Bassette A, NP 07/11/18 1049

## 2019-08-14 ENCOUNTER — Encounter (HOSPITAL_COMMUNITY): Payer: Self-pay

## 2019-08-14 ENCOUNTER — Other Ambulatory Visit: Payer: Self-pay

## 2019-08-14 ENCOUNTER — Ambulatory Visit (HOSPITAL_COMMUNITY)
Admission: EM | Admit: 2019-08-14 | Discharge: 2019-08-14 | Disposition: A | Discharge status: Home / Self Care | Payer: 59 | Attending: Family Medicine | Admitting: Family Medicine

## 2019-08-14 DIAGNOSIS — H60391 Other infective otitis externa, right ear: Secondary | ICD-10-CM | POA: Diagnosis not present

## 2019-08-14 DIAGNOSIS — H9201 Otalgia, right ear: Secondary | ICD-10-CM

## 2019-08-14 MED ORDER — NEOMYCIN-POLYMYXIN-HC 3.5-10000-1 OT SUSP: 4.0000 [drp] | Freq: Three times a day (TID) | OTIC | 0 refills | Status: AC

## 2019-08-14 NOTE — Discharge Instructions (Addendum)
You have an outer ear infection. I have sent in some antibiotic ear drops for you to use for at least the next 7 days.   Use the other ear for work with your headset if possible.   If you are not feeling better within the next 2 days, follow up with this office.

## 2019-08-14 NOTE — ED Triage Notes (Signed)
Pt reports right ear pain x 3 days. Pt states she put peroxide and got worse. Pt states she feels underwater.

## 2020-04-10 ENCOUNTER — Other Ambulatory Visit: Payer: Self-pay

## 2020-04-10 ENCOUNTER — Encounter (HOSPITAL_COMMUNITY): Payer: Self-pay | Admitting: Emergency Medicine

## 2020-04-10 ENCOUNTER — Ambulatory Visit (HOSPITAL_COMMUNITY)
Admission: EM | Admit: 2020-04-10 | Discharge: 2020-04-10 | Disposition: A | Discharge status: Home / Self Care | Payer: 59

## 2020-04-10 DIAGNOSIS — H00014 Hordeolum externum left upper eyelid: Secondary | ICD-10-CM

## 2020-04-10 MED ORDER — KETOROLAC TROMETHAMINE 0.5 % OP SOLN: 1.0000 [drp] | Freq: Four times a day (QID) | OPHTHALMIC | 0 refills | Status: AC

## 2020-04-10 MED ORDER — TETRACAINE HCL 0.5 % OP SOLN
OPHTHALMIC | Status: AC
  Filled 2020-04-10: qty 4

## 2020-04-10 MED ORDER — POLYMYXIN B-TRIMETHOPRIM 10000-0.1 UNIT/ML-% OP SOLN: 1.0000 [drp] | OPHTHALMIC | 0 refills | Status: AC

## 2020-04-10 MED ORDER — FLUORESCEIN SODIUM 1 MG OP STRP
ORAL_STRIP | OPHTHALMIC | Status: AC
  Filled 2020-04-10: qty 1

## 2020-04-10 NOTE — ED Provider Notes (Signed)
[facility]    CSN: [id number] Arrival date & time: 04/10/20  1148      History   Chief Complaint Chief Complaint  Patient presents with  . Eye Pain    HPI Christina Castro is a 49 y.o. female presenting for left eye pain and eyelid swelling for 4 days. She states that she was cooking, and lemon juice squirted into her eyes. She didn't immediately rinse out her eyes, but she has thoroughly cleaned them in the shower since then. Her right eye feels fine, but her left eye has been increasingly irritated. States she has 9/10 pain in the upper eyelid at rest, and it's painful to move the eyelid. Endorses yellow crusting that is worse in the morning. The eye also feels somewhat sensitive to light, and it has been tearing. Denies other symptoms such as ear pain, cough, etc. Denies changes in vision.  HPI  Past Medical History:  Diagnosis Date  . 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 Depression    "S/P losing my daughter d/t rejection post lung transplant"  . GERD (gastroesophageal reflux disease)   . Headache(784.0)    "related to starting my periods" (09/11/2013)    Patient Active Problem List   Diagnosis Date Noted  . Incisional hernia with obstruction 09/11/2013  . Incisional hernia, without obstruction or gangrene 06/27/2013  . Morbid obesity (HCC) 06/27/2013    Past Surgical History:  Procedure Laterality Date  . CESAREAN SECTION  1993; 1997; 1998  . CHOLECYSTECTOMY  1993   w/UHR  . CYST EXCISION  1985   "end of my spine"  . GASTRIC BYPASS  1999  . HERNIA REPAIR    . INSERTION OF MESH N/A 09/11/2013   Procedure: INSERTION OF MESH;  Surgeon: Ernestene Mention, MD;  Location: [facility] OR;  Service: General;  Laterality: N/A;  . LAPAROSCOPIC INCISIONAL / UMBILICAL / VENTRAL HERNIA REPAIR  09/11/2013   VHR w/mesh and LOA   . LYSIS OF ADHESION N/A 09/11/2013   Procedure: LAPAROSCOPIC LYSIS OF ADHESION;  Surgeon: Ernestene Mention, MD;  Location: [facility] OR;  Service: General;   Laterality: N/A;  . TONSILLECTOMY  1972  . TUBAL LIGATION  1998  . UMBILICAL HERNIA REPAIR  1993   w/chole  . VENTRAL HERNIA REPAIR N/A 09/11/2013   Procedure: LAPAROSCOPIC VENTRAL HERNIA REPAIR ;  Surgeon: Ernestene Mention, MD;  Location: [facility] OR;  Service: General;  Laterality: N/A;    OB History   No obstetric history on file.      Home Medications    Prior to Admission medications   Medication Sig Start Date End Date Taking? Authorizing Provider  ergocalciferol (VITAMIN D2) 1.25 MG (50000 UT) capsule Take 50,000 Units by mouth once a week.   Yes [provider]  metFORMIN (GLUCOPHAGE) 500 MG tablet Take 500 mg by mouth daily with breakfast.   Yes [provider]  phentermine 15 MG capsule Take 15 mg by mouth every morning.   Yes [provider]  cetirizine-pseudoephedrine (ZYRTEC-D) 5-120 MG tablet Take 1 tablet by mouth daily. 07/11/18   Dahlia Byes A, NP  clotrimazole (LOTRIMIN) 1 % cream Apply 1 application topically 2 (two) times daily. 07/11/18   Dahlia Byes A, NP  ketorolac (ACULAR) 0.5 % ophthalmic solution Place 1 drop into both eyes every 6 (six) hours for 3 days. 04/10/20 04/13/20  Rhys Martini, PA-C  mupirocin nasal ointment (BACTROBAN) 2 % Apply below each nostril daily 06/20/18  Couture, Cortni S, PA-C  trimethoprim-polymyxin b (POLYTRIM) ophthalmic solution Place 1 drop into the left eye every 4 (four) hours for 7 days. 04/10/20 04/17/20  Rhys Martini, PA-C  pantoprazole (PROTONIX) 40 MG tablet daily. 10/27/13 04/10/20  [provider]  sertraline (ZOLOFT) 50 MG tablet Take 50 mg by mouth daily.  07/04/13 04/10/20  [provider]    Family History Family History  Problem Relation Age of Onset  . Cancer Mother        breast  . Cancer Father        prostate  . Cancer Sister        ovarian  . Cancer Paternal Uncle        prostate  . Cancer Maternal Grandmother        breast  . Cancer Paternal Grandfather        prostate   . Cancer Sister        ovarian    Social History Social History   Tobacco Use  . Smoking status: Never Smoker  . Smokeless tobacco: Never Used  Substance Use Topics  . Alcohol use: Yes    Comment: 09/11/2013 "glass of wine/month maybe"  . Drug use: No     Allergies   Patient has no known allergies.   Review of Systems Review of Systems  Constitutional: Negative for chills and fever.  HENT: Negative for congestion, ear discharge, ear pain, hearing loss, sinus pain and sore throat.   Eyes: Positive for photophobia, pain, discharge and redness. Negative for visual disturbance.  Respiratory: Negative for shortness of breath.   Cardiovascular: Negative for chest pain.  All other systems reviewed and are negative.    Physical Exam Triage Vital Signs ED Triage Vitals  Enc Vitals Group     BP 04/10/20 1250 (!) 145/82     Pulse Rate 04/10/20 1250 88     Resp 04/10/20 1250 18     Temp 04/10/20 1250 98.2 F (36.8 C)     Temp Source 04/10/20 1250 Oral     SpO2 04/10/20 1250 97 %     Weight --      Height --      Head Circumference --      Peak Flow --      Pain Score 04/10/20 1249 10     Pain Loc --      Pain Edu? --      Excl. in GC? --    No data found.  Updated Vital Signs BP (!) 145/82 (BP Location: Right Wrist)   Pulse 88   Temp 98.2 F (36.8 C) (Oral)   Resp 18   SpO2 97%   Visual Acuity Right Eye Distance: 20/25 Left Eye Distance: 20/40 Bilateral Distance: 20/25  Right Eye Near:   Left Eye Near:    Bilateral Near:     Physical Exam Vitals reviewed.  Constitutional:      General: She is not in acute distress.    Appearance: She is obese.  HENT:     Head: Normocephalic and atraumatic.     Right Ear: Tympanic membrane, ear canal and external ear normal. There is no impacted cerumen.     Left Ear: Tympanic membrane, ear canal and external ear normal. There is no impacted cerumen.     Nose: Nose normal.  Eyes:     General: Vision grossly  intact. Gaze aligned appropriately.        Right eye: No foreign body, discharge or  hordeolum.        Left eye: Discharge and hordeolum present.No foreign body.     Extraocular Movements: Extraocular movements intact.     Conjunctiva/sclera:     Right eye: Right conjunctiva is not injected.     Left eye: Left conjunctiva is injected.     Pupils: Pupils are equal, round, and reactive to light.   Cardiovascular:     Rate and Rhythm: Normal rate and regular rhythm.     Heart sounds: Normal heart sounds.  Pulmonary:     Breath sounds: Normal breath sounds. No wheezing, rhonchi or rales.  Lymphadenopathy:     Cervical: No cervical adenopathy.  Neurological:     Mental Status: She is alert.      UC Treatments / Results  Labs (all labs ordered are listed, but only abnormal results are displayed) Labs Reviewed - No data to display  EKG   Radiology No results found.  Procedures Procedures (including critical care time)  Medications Ordered in UC Medications - No data to display  Initial Impression / Assessment and Plan / UC Course  I have reviewed the triage vital signs and the nursing notes.  Pertinent labs & imaging results that were available during my care of the patient were reviewed by me and considered in my medical decision making (see chart for details).     Fluorescein stain showing no corneal abrasion or laceration. Polytrim and ketorolac ophthalmic drops sent as below. Also rec warm compresses.   Final Clinical Impressions(s) / UC Diagnoses   Final diagnoses:  Hordeolum externum of left upper eyelid     Discharge Instructions     Use polytrim drops 3-4x daily for 7 days, and ketorolac drops 3 times daily for 3 days   ED Prescriptions    Medication Sig Dispense Auth. Provider   ketorolac (ACULAR) 0.5 % ophthalmic solution Place 1 drop into both eyes every 6 (six) hours for 3 days. 5 mL Rhys Martini, PA-C   trimethoprim-polymyxin b (POLYTRIM)  ophthalmic solution Place 1 drop into the left eye every 4 (four) hours for 7 days. 10 mL Rhys Martini, PA-C     PDMP not reviewed this encounter.   Rhys Martini, PA-C 04/10/20 1400

## 2020-04-10 NOTE — Discharge Instructions (Addendum)
Use polytrim drops 3-4x daily for 7 days, and ketorolac drops 3 times daily for 3 days

## 2020-04-10 NOTE — ED Triage Notes (Signed)
Pt presents with left eye pain and swelling. States 4 days ago was cooking with lemons and had lemon juice squirt into eye but denies any injury to eye.

## 2020-05-16 ENCOUNTER — Ambulatory Visit (HOSPITAL_COMMUNITY)
Admission: EM | Admit: 2020-05-16 | Discharge: 2020-05-16 | Disposition: A | Discharge status: Home / Self Care | Payer: 59 | Attending: Family Medicine | Admitting: Family Medicine

## 2020-05-16 ENCOUNTER — Encounter (HOSPITAL_COMMUNITY): Payer: Self-pay

## 2020-05-16 ENCOUNTER — Other Ambulatory Visit: Payer: Self-pay

## 2020-05-16 DIAGNOSIS — S46912A Strain of unspecified muscle, fascia and tendon at shoulder and upper arm level, left arm, initial encounter: Secondary | ICD-10-CM | POA: Insufficient documentation

## 2020-05-16 DIAGNOSIS — R509 Fever, unspecified: Secondary | ICD-10-CM

## 2020-05-16 DIAGNOSIS — X509XXA Other and unspecified overexertion or strenuous movements or postures, initial encounter: Secondary | ICD-10-CM | POA: Diagnosis not present

## 2020-05-16 DIAGNOSIS — U071 COVID-19: Secondary | ICD-10-CM | POA: Insufficient documentation

## 2020-05-16 LAB — SARS CORONAVIRUS 2 (TAT 6-24 HRS): SARS Coronavirus 2: POSITIVE — AB

## 2020-05-16 MED ORDER — PREDNISONE 20 MG PO TABS: 40.0000 mg | ORAL_TABLET | Freq: Every day | ORAL | 0 refills | Status: DC

## 2020-05-16 MED ORDER — ACETAMINOPHEN 325 MG PO TABS
ORAL_TABLET | ORAL | Status: AC
  Filled 2020-05-16: qty 2

## 2020-05-16 MED ORDER — ACETAMINOPHEN 325 MG PO TABS
650.0000 mg | ORAL_TABLET | Freq: Once | ORAL | Status: AC
  Administered 2020-05-16: 650 mg via ORAL

## 2020-05-16 MED ORDER — CYCLOBENZAPRINE HCL 10 MG PO TABS: 10.0000 mg | ORAL_TABLET | Freq: Two times a day (BID) | ORAL | 0 refills | Status: DC | PRN

## 2020-05-16 NOTE — ED Triage Notes (Signed)
Pt c/o left shoulder pain that radiates to her wrist. Pt states she has tried ointments and lidocaine patches with no relief. Pt states she does not know if she injured it. Pt states her husband noticed swelling around her shoulder and she states it is painful to the touch. She states she feels a sharp pain.

## 2020-05-16 NOTE — ED Provider Notes (Signed)
[facility]    CSN: [id number] Arrival date & time: 05/16/20  0948      History   Chief Complaint Chief Complaint  Patient presents with  . Shoulder Pain  . Wrist Pain    HPI Christina Castro is a 50 y.o. female.   Here today with about 4 days of left shoulder pain radiating down arm toward wrist. No known injury, no swelling, rashes, discoloration. Pain starts on left upper back around scapula and runs down. Pain is off and on, no known trigger, not worse with movement. Also started with fever, drainage, hoarse voice yesterday. Trying OTC pain patches, OTC pain relievers without relief for arm pain. No known sick contacts.      Past Medical History:  Diagnosis Date  . 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 Depression    "S/P losing my daughter d/t rejection post lung transplant"  . GERD (gastroesophageal reflux disease)   . Headache(784.0)    "related to starting my periods" (09/11/2013)    Patient Active Problem List   Diagnosis Date Noted  . Incisional hernia with obstruction 09/11/2013  . Incisional hernia, without obstruction or gangrene 06/27/2013  . Morbid obesity (HCC) 06/27/2013    Past Surgical History:  Procedure Laterality Date  . CESAREAN SECTION  1993; 1997; 1998  . CHOLECYSTECTOMY  1993   w/UHR  . CYST EXCISION  1985   "end of my spine"  . GASTRIC BYPASS  1999  . HERNIA REPAIR    . INSERTION OF MESH N/A 09/11/2013   Procedure: INSERTION OF MESH;  Surgeon: Ernestene Mention, MD;  [facility] OR;  Service: General;  Laterality: N/A;  . LAPAROSCOPIC INCISIONAL / UMBILICAL / VENTRAL HERNIA REPAIR  09/11/2013   VHR w/mesh and LOA   . LYSIS OF ADHESION N/A 09/11/2013   Procedure: LAPAROSCOPIC LYSIS OF ADHESION;  Surgeon: Ernestene Mention, MD;  Location: [facility] OR;  Service: General;  Laterality: N/A;  . TONSILLECTOMY  1972  . TUBAL LIGATION  1998  . UMBILICAL HERNIA REPAIR  1993   w/chole  . VENTRAL HERNIA REPAIR N/A 09/11/2013   Procedure:  LAPAROSCOPIC VENTRAL HERNIA REPAIR ;  Surgeon: Ernestene Mention, MD;  Location: [facility] OR;  Service: General;  Laterality: N/A;    OB History   No obstetric history on file.      Home Medications    Prior to Admission medications   Medication Sig Start Date End Date Taking? Authorizing Provider  cyclobenzaprine (FLEXERIL) 10 MG tablet Take 1 tablet (10 mg total) by mouth 2 (two) times daily as needed for muscle spasms. DO NOT DRINK ALCOHOL OR DRIVE WHILE TAKING THIS MEDICATION. MAY CAUSE DROWSINESS 05/16/20  Yes Particia Nearing, PA-C  predniSONE (DELTASONE) 20 MG tablet Take 2 tablets (40 mg total) by mouth daily with breakfast. 05/16/20  Yes Particia Nearing, PA-C  cetirizine-pseudoephedrine (ZYRTEC-D) 5-120 MG tablet Take 1 tablet by mouth daily. 07/11/18   Dahlia Byes A, NP  clotrimazole (LOTRIMIN) 1 % cream Apply 1 application topically 2 (two) times daily. 07/11/18   Dahlia Byes A, NP  ergocalciferol (VITAMIN D2) 1.25 MG (50000 UT) capsule Take 50,000 Units by mouth once a week.    [provider]  metFORMIN (GLUCOPHAGE) 500 MG tablet Take 500 mg by mouth daily with breakfast.    [provider]  mupirocin nasal ointment (BACTROBAN) 2 % Apply below each nostril daily 06/20/18   Couture, Cortni S, PA-C  phentermine 15  MG capsule Take 15 mg by mouth every morning.    [provider]  pantoprazole (PROTONIX) 40 MG tablet daily. 10/27/13 04/10/20  [provider]  sertraline (ZOLOFT) 50 MG tablet Take 50 mg by mouth daily.  07/04/13 04/10/20  [provider]    Family History Family History  Problem Relation Age of Onset  . Cancer Mother        breast  . Cancer Father        prostate  . Cancer Sister        ovarian  . Cancer Paternal Uncle        prostate  . Cancer Maternal Grandmother        breast  . Cancer Paternal Grandfather        prostate  . Cancer Sister        ovarian    Social History Social History   Tobacco Use  .  Smoking status: Never Smoker  . Smokeless tobacco: Never Used  Substance Use Topics  . Alcohol use: Yes    Comment: 09/11/2013 "glass of wine/month maybe"  . Drug use: No     Allergies   Patient has no known allergies.   Review of Systems Review of Systems PER HPI   Physical Exam Triage Vital Signs ED Triage Vitals  Enc Vitals Group     BP 05/16/20 1035 140/68     Pulse Rate 05/16/20 1035 97     Resp 05/16/20 1035 18     Temp 05/16/20 1033 (!) 100.9 F (38.3 C)     Temp Source 05/16/20 1033 Oral     SpO2 05/16/20 1035 96 %     Weight --      Height --      Head Circumference --      Peak Flow --      Pain Score 05/16/20 1029 10     Pain Loc --      Pain Edu? --      Excl. in GC? --    No data found.  Updated Vital Signs BP 140/68 (BP Location: Right Arm)   Pulse 97   Temp (!) 100.9 F (38.3 C) (Oral)   Resp 18   SpO2 96%   Visual Acuity Right Eye Distance:   Left Eye Distance:   Bilateral Distance:    Right Eye Near:   Left Eye Near:    Bilateral Near:     Physical Exam Vitals and nursing note reviewed.  Constitutional:      Appearance: Normal appearance. She is not ill-appearing.  HENT:     Head: Atraumatic.     Right Ear: Tympanic membrane normal.     Left Ear: Tympanic membrane normal.     Nose: Nose normal.     Mouth/Throat:     Mouth: Mucous membranes are moist.     Pharynx: Posterior oropharyngeal erythema present. No oropharyngeal exudate.  Eyes:     Extraocular Movements: Extraocular movements intact.     Conjunctiva/sclera: Conjunctivae normal.  Cardiovascular:     Rate and Rhythm: Normal rate and regular rhythm.     Heart sounds: Normal heart sounds.  Pulmonary:     Effort: Pulmonary effort is normal. No respiratory distress.     Breath sounds: Normal breath sounds. No wheezing or rales.  Abdominal:     General: Bowel sounds are normal. There is no distension.     Palpations: Abdomen is soft.     Tenderness: There is no  abdominal tenderness. There is no guarding.  Musculoskeletal:        General: Tenderness (significant ttp lateral scapular border musculature extending to left deltoid and down posterior arm) present. No swelling, deformity or signs of injury. Normal range of motion.     Cervical back: Normal range of motion and neck supple.  Skin:    General: Skin is warm and dry.     Findings: No bruising, erythema or rash.  Neurological:     Mental Status: She is alert and oriented to person, place, and time.     Motor: No weakness.     Gait: Gait normal.     Comments: B/l UEs neurovascularly intact  Psychiatric:        Mood and Affect: Mood normal.        Thought Content: Thought content normal.        Judgment: Judgment normal.      UC Treatments / Results  Labs (all labs ordered are listed, but only abnormal results are displayed) Labs Reviewed  SARS CORONAVIRUS 2 (TAT 6-24 HRS)    EKG   Radiology No results found.  Procedures Procedures (including critical care time)  Medications Ordered in UC Medications  acetaminophen (TYLENOL) tablet 650 mg (650 mg Oral Given 05/16/20 1100)    Initial Impression / Assessment and Plan / UC Course  I have reviewed the triage vital signs and the nursing notes.  Pertinent labs & imaging results that were available during my care of the patient were reviewed by me and considered in my medical decision making (see chart for details).     No evidence of bony abnormality on exam, suspect muscular, radicular from shoulder impingement. Will treat with prednisone, flexeril, OTC pain relievers. COVID pcr pending, OTC fever reducers and supportive care. Tylenol given in triage for fever.   Final Clinical Impressions(s) / UC Diagnoses   Final diagnoses:  Strain of left shoulder, initial encounter  Fever, unspecified   Discharge Instructions   None    ED Prescriptions    Medication Sig Dispense Auth. Provider   predniSONE (DELTASONE) 20 MG  tablet Take 2 tablets (40 mg total) by mouth daily with breakfast. 10 tablet Particia Nearing, PA-C   cyclobenzaprine (FLEXERIL) 10 MG tablet Take 1 tablet (10 mg total) by mouth 2 (two) times daily as needed for muscle spasms. DO NOT DRINK ALCOHOL OR DRIVE WHILE TAKING THIS MEDICATION. MAY CAUSE DROWSINESS 10 tablet Particia Nearing, [place]     PDMP not reviewed this encounter.   Particia Nearing, [place] 05/16/20 1623

## 2020-05-21 ENCOUNTER — Ambulatory Visit (HOSPITAL_COMMUNITY)
Admission: EM | Admit: 2020-05-21 | Discharge: 2020-05-21 | Disposition: A | Discharge status: Nursing Home (Includes ICF) | Payer: 59

## 2020-05-21 NOTE — ED Triage Notes (Signed)
Pt initially refused to go to the ED and just wanted Z-pack for sx.

## 2020-05-21 NOTE — ED Triage Notes (Signed)
Pt here for SOB ... reports she was seen here on 1/13 and tested positive for COVID   O2 here is 83% sitting down w/RR at 26   [place], [place] spoke w/pt and adv her to go straight to the hosp  Pt declined ambulance service and sts her husband can drive her.  Pt instructed not to go any where else and go straight to the ED... Pt verb understanding.

## 2020-05-24 ENCOUNTER — Emergency Department (HOSPITAL_COMMUNITY): Payer: 59

## 2020-05-24 ENCOUNTER — Other Ambulatory Visit: Payer: Self-pay

## 2020-05-24 ENCOUNTER — Inpatient Hospital Stay (HOSPITAL_COMMUNITY)
Admission: EM | Admit: 2020-05-24 | Discharge: 2020-05-29 | DRG: 177 | Disposition: A | Discharge status: Home / Self Care | Payer: 59 | Attending: Internal Medicine | Admitting: Internal Medicine

## 2020-05-24 DIAGNOSIS — E872 Acidosis: Secondary | ICD-10-CM | POA: Diagnosis present

## 2020-05-24 DIAGNOSIS — Z6841 Body Mass Index (BMI) 40.0 and over, adult: Secondary | ICD-10-CM

## 2020-05-24 DIAGNOSIS — I82441 Acute embolism and thrombosis of right tibial vein: Secondary | ICD-10-CM | POA: Diagnosis present

## 2020-05-24 DIAGNOSIS — R197 Diarrhea, unspecified: Secondary | ICD-10-CM

## 2020-05-24 DIAGNOSIS — Z7984 Long term (current) use of oral hypoglycemic drugs: Secondary | ICD-10-CM | POA: Diagnosis not present

## 2020-05-24 DIAGNOSIS — Z79899 Other long term (current) drug therapy: Secondary | ICD-10-CM | POA: Diagnosis not present

## 2020-05-24 DIAGNOSIS — K219 Gastro-esophageal reflux disease without esophagitis: Secondary | ICD-10-CM | POA: Diagnosis present

## 2020-05-24 DIAGNOSIS — R0902 Hypoxemia: Secondary | ICD-10-CM

## 2020-05-24 DIAGNOSIS — A419 Sepsis, unspecified organism: Secondary | ICD-10-CM | POA: Insufficient documentation

## 2020-05-24 DIAGNOSIS — R7989 Other specified abnormal findings of blood chemistry: Secondary | ICD-10-CM | POA: Diagnosis not present

## 2020-05-24 DIAGNOSIS — J9601 Acute respiratory failure with hypoxia: Secondary | ICD-10-CM | POA: Diagnosis not present

## 2020-05-24 DIAGNOSIS — Z9884 Bariatric surgery status: Secondary | ICD-10-CM

## 2020-05-24 DIAGNOSIS — F32A Depression, unspecified: Secondary | ICD-10-CM | POA: Diagnosis present

## 2020-05-24 DIAGNOSIS — A0839 Other viral enteritis: Secondary | ICD-10-CM | POA: Diagnosis present

## 2020-05-24 DIAGNOSIS — U071 COVID-19: Secondary | ICD-10-CM | POA: Diagnosis not present

## 2020-05-24 DIAGNOSIS — R6520 Severe sepsis without septic shock: Secondary | ICD-10-CM | POA: Insufficient documentation

## 2020-05-24 DIAGNOSIS — J1282 Pneumonia due to coronavirus disease 2019: Secondary | ICD-10-CM | POA: Diagnosis present

## 2020-05-24 LAB — CBC WITH DIFFERENTIAL/PLATELET
Abs Immature Granulocytes: 0.33 10*3/uL — ABNORMAL HIGH (ref 0.00–0.07)
Basophils Absolute: 0.1 10*3/uL (ref 0.0–0.1)
Basophils Relative: 0 %
Eosinophils Absolute: 0.2 10*3/uL (ref 0.0–0.5)
Eosinophils Relative: 1 %
HCT: 42.4 % (ref 36.0–46.0)
Hemoglobin: 13.9 g/dL (ref 12.0–15.0)
Immature Granulocytes: 2 %
Lymphocytes Relative: 8 %
Lymphs Abs: 1.3 10*3/uL (ref 0.7–4.0)
MCH: 29.5 pg (ref 26.0–34.0)
MCHC: 32.8 g/dL (ref 30.0–36.0)
MCV: 90 fL (ref 80.0–100.0)
Monocytes Absolute: 0.8 10*3/uL (ref 0.1–1.0)
Monocytes Relative: 5 %
Neutro Abs: 14.8 10*3/uL — ABNORMAL HIGH (ref 1.7–7.7)
Neutrophils Relative %: 84 %
Platelets: 322 10*3/uL (ref 150–400)
RBC: 4.71 MIL/uL (ref 3.87–5.11)
RDW: 13.9 % (ref 11.5–15.5)
WBC: 17.4 10*3/uL — ABNORMAL HIGH (ref 4.0–10.5)
nRBC: 0.1 % (ref 0.0–0.2)

## 2020-05-24 LAB — COMPREHENSIVE METABOLIC PANEL
ALT: 21 U/L (ref 0–44)
AST: 42 U/L — ABNORMAL HIGH (ref 15–41)
Albumin: 2.4 g/dL — ABNORMAL LOW (ref 3.5–5.0)
Alkaline Phosphatase: 67 U/L (ref 38–126)
Anion gap: 12 (ref 5–15)
BUN: 6 mg/dL (ref 6–20)
CO2: 18 mmol/L — ABNORMAL LOW (ref 22–32)
Calcium: 8.2 mg/dL — ABNORMAL LOW (ref 8.9–10.3)
Chloride: 104 mmol/L (ref 98–111)
Creatinine, Ser: 0.74 mg/dL (ref 0.44–1.00)
GFR, Estimated: >60 mL/min (ref >60)
Glucose, Bld: 148 mg/dL — ABNORMAL HIGH (ref 70–99)
Potassium: 4.2 mmol/L (ref 3.5–5.1)
Sodium: 134 mmol/L — ABNORMAL LOW (ref 135–145)
Total Bilirubin: 0.6 mg/dL (ref 0.3–1.2)
Total Protein: 6.6 g/dL (ref 6.5–8.1)

## 2020-05-24 LAB — PROCALCITONIN: Procalcitonin: 0.85 ng/mL

## 2020-05-24 LAB — LACTIC ACID, PLASMA
Lactic Acid, Venous: 2.4 mmol/L (ref 0.5–1.9)
Lactic Acid, Venous: 2.7 mmol/L (ref 0.5–1.9)

## 2020-05-24 LAB — C-REACTIVE PROTEIN: CRP: 14.4 mg/dL — ABNORMAL HIGH (ref <1.0)

## 2020-05-24 LAB — FIBRINOGEN: Fibrinogen: 614 mg/dL — ABNORMAL HIGH (ref 210–475)

## 2020-05-24 LAB — FERRITIN: Ferritin: 147 ng/mL (ref 11–307)

## 2020-05-24 LAB — LACTATE DEHYDROGENASE: LDH: 541 U/L — ABNORMAL HIGH (ref 98–192)

## 2020-05-24 LAB — D-DIMER, QUANTITATIVE: D-Dimer, Quant: 6.05 ug/mL-FEU — ABNORMAL HIGH (ref 0.00–0.50)

## 2020-05-24 LAB — TRIGLYCERIDES: Triglycerides: 104 mg/dL (ref <150)

## 2020-05-24 MED ORDER — ASCORBIC ACID 500 MG PO TABS
500.0000 mg | ORAL_TABLET | Freq: Every day | ORAL | Status: DC
  Administered 2020-05-25 – 2020-05-29 (×5): 500 mg via ORAL
  Filled 2020-05-24 (×6): qty 1

## 2020-05-24 MED ORDER — ZINC SULFATE 220 (50 ZN) MG PO CAPS
220.0000 mg | ORAL_CAPSULE | Freq: Every day | ORAL | Status: DC
  Administered 2020-05-25 – 2020-05-29 (×5): 220 mg via ORAL
  Filled 2020-05-24 (×5): qty 1

## 2020-05-24 MED ORDER — ACETAMINOPHEN 325 MG PO TABS: 650.0000 mg | ORAL_TABLET | Freq: Four times a day (QID) | ORAL | Status: DC | PRN

## 2020-05-24 MED ORDER — HYDROCOD POLST-CPM POLST ER 10-8 MG/5ML PO SUER: 5.0000 mL | Freq: Two times a day (BID) | ORAL | Status: DC | PRN

## 2020-05-24 MED ORDER — IOHEXOL 350 MG/ML SOLN
75.0000 mL | Freq: Once | INTRAVENOUS | Status: AC | PRN
  Administered 2020-05-24: 75 mL via INTRAVENOUS

## 2020-05-24 MED ORDER — SODIUM CHLORIDE 0.9 % IV SOLN
100.0000 mg | Freq: Every day | INTRAVENOUS | Status: AC
  Administered 2020-05-25 – 2020-05-28 (×4): 100 mg via INTRAVENOUS
  Filled 2020-05-24 (×4): qty 20

## 2020-05-24 MED ORDER — DEXAMETHASONE SODIUM PHOSPHATE 10 MG/ML IJ SOLN
10.0000 mg | Freq: Once | INTRAMUSCULAR | Status: AC
  Administered 2020-05-24: 10 mg via INTRAVENOUS
  Filled 2020-05-24: qty 1

## 2020-05-24 MED ORDER — PREDNISONE 5 MG PO TABS: 50.0000 mg | ORAL_TABLET | Freq: Every day | ORAL | Status: DC

## 2020-05-24 MED ORDER — ENOXAPARIN SODIUM 150 MG/ML ~~LOC~~ SOLN
1.0000 mg/kg | Freq: Two times a day (BID) | SUBCUTANEOUS | Status: DC
  Administered 2020-05-25 (×3): 126 mg via SUBCUTANEOUS
  Filled 2020-05-24 (×4): qty 0.84

## 2020-05-24 MED ORDER — ALBUTEROL SULFATE HFA 108 (90 BASE) MCG/ACT IN AERS
2.0000 | INHALATION_SPRAY | RESPIRATORY_TRACT | Status: DC | PRN
  Filled 2020-05-24: qty 6.7

## 2020-05-24 MED ORDER — SODIUM CHLORIDE 0.9 % IV SOLN
200.0000 mg | Freq: Once | INTRAVENOUS | Status: AC
  Administered 2020-05-25: 200 mg via INTRAVENOUS
  Filled 2020-05-24: qty 40

## 2020-05-24 MED ORDER — SODIUM CHLORIDE 0.9 % IV BOLUS
1000.0000 mL | Freq: Once | INTRAVENOUS | Status: AC
  Administered 2020-05-25: 1000 mL via INTRAVENOUS

## 2020-05-24 MED ORDER — BARICITINIB 2 MG PO TABS
4.0000 mg | ORAL_TABLET | Freq: Every day | ORAL | Status: DC
  Administered 2020-05-25 – 2020-05-29 (×6): 4 mg via ORAL
  Filled 2020-05-24 (×9): qty 2

## 2020-05-24 MED ORDER — GUAIFENESIN-DM 100-10 MG/5ML PO SYRP: 10.0000 mL | ORAL_SOLUTION | ORAL | Status: DC | PRN

## 2020-05-24 MED ORDER — METHYLPREDNISOLONE SODIUM SUCC 125 MG IJ SOLR
0.5000 mg/kg | Freq: Two times a day (BID) | INTRAMUSCULAR | Status: DC
  Administered 2020-05-25: 63.75 mg via INTRAVENOUS
  Filled 2020-05-24: qty 2

## 2020-05-24 MED ORDER — SODIUM CHLORIDE 0.9 % IV SOLN
100.0000 mg | Freq: Two times a day (BID) | INTRAVENOUS | Status: DC
  Administered 2020-05-25 (×3): 100 mg via INTRAVENOUS
  Filled 2020-05-24 (×9): qty 100

## 2020-05-24 MED ORDER — SODIUM CHLORIDE 0.9 % IV SOLN
1.0000 g | INTRAVENOUS | Status: DC
  Administered 2020-05-25: 1 g via INTRAVENOUS
  Filled 2020-05-24: qty 10

## 2020-05-24 MED ORDER — VITAMIN D 25 MCG (1000 UNIT) PO TABS
1000.0000 [IU] | ORAL_TABLET | Freq: Every day | ORAL | Status: DC
  Administered 2020-05-25 – 2020-05-29 (×5): 1000 [IU] via ORAL
  Filled 2020-05-24 (×5): qty 1

## 2020-05-24 NOTE — Progress Notes (Signed)
ANTICOAGULATION CONSULT NOTE - Initial Consult  Pharmacy Consult for lovenox Indication: COVID +, elevated d-dimer  No Known Allergies  Patient Measurements: Height: 5\' 5"  (165.1 cm) Weight: 127 kg (280 lb) IBW/kg (Calculated) : 57  Vital Signs: Temp: 98.4 F (36.9 C) (01/21 2030) Temp Source: Oral (01/21 2030) BP: 144/90 (01/21 2145) Pulse Rate: 99 (01/21 2145)  Labs: Recent Labs    05/24/20 1714  HGB 13.9  HCT 42.4  PLT 322  CREATININE 0.74    Estimated Creatinine Clearance: 111.6 mL/min (by C-G formula based on SCr of 0.74 mg/dL).   Medical History: Past Medical History:  Diagnosis Date  . 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 Depression    "S/P losing my daughter d/t rejection post lung transplant"  . GERD (gastroesophageal reflux disease)   . Headache(784.0)    "related to starting my periods" (09/11/2013)   Assessment: 49 YOF presenting with SOB, COVID +, D-dimer 6.05, she is not on anticoagulation PTA.  CT chest angiogram negative for PE, doppler ordered, CBC wnl.    Goal of Therapy:  Anti-Xa level 0.6-1 units/ml 4hrs after LMWH dose given Monitor platelets by anticoagulation protocol: Yes   Plan:  Lovenox 1mg /kg (126mg  ) SQ every 12 hours Monitor renal function, s/s bleeding, VTE workup and D-dimer trend  11/11/2013, PharmD Clinical Pharmacist ED Pharmacist Phone # [phone] 05/24/2020 10:26 PM

## 2020-05-24 NOTE — ED Notes (Signed)
Pt in CT.

## 2020-05-24 NOTE — ED Triage Notes (Signed)
BIB [facility] w/ complaints of shortness of breath that started to increase in severity starting yesterday. Pt was diagnosed with COVID19 on Friday 05/18/19 along with the flu at [facility] medical [facility]. Pt was sent home with antibiotics and told to treat symptoms with supportive measures. Pt has at home pulse oximeter and noticed yesterday with ambulation that O2 sat would drop in the 80s and become really labored when walking to the bathroom. Today this worsened and per EMS O2 sat dropped to 65 on RA. Pt was placed on 12 L NRB w/ EMS and there was improvement to 91%. Pt has no complaints other then feeling generally weak, having SOB, and having intermittent fevers throughout the week. Pt using tylenol to tx and last took @ 1000 today. Pt does endorse diarrhea today but denies recent n/v.

## 2020-05-24 NOTE — ED Provider Notes (Signed)
[facility] Provider Note   CSN: [id number] Arrival date & time: 05/24/20  1625     History Chief Complaint  Patient presents with   Shortness of Breath    Christina Castro is a 50 y.o. female.  The history is provided by the patient and medical records.  Shortness of Breath  Christina Castro is a 50 y.o. female who presents to the Emergency Department complaining of shortness of breath. She presents to the emergency department by EMS for shortness of breath. She states that she got sick just over a week ago with cough, fatigue. She went to urgent care one week ago and was diagnosed with COVID-19. About five days ago she went to [facility] where they did a second COVID test, which was positive. She states that she also tested positive for influenza. She was started on azithromycin, prednisone. She presents today due to progressive shortness of breath. She states that she has cough that is nonproductive with low-grade fevers. She reports significant shortness of breath and dyspnea on exertion. She checked pulse ox at home and went down to 65% on attempting to ambulate today. She has no known medical problems and takes no medications. She does not smoke, drink alcohol or use drugs. She has not been vaccinated for COVID-19. She does report mild increased left lower extremity edema today.    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    Depression    "S/P losing my daughter d/t rejection post lung transplant"   GERD (gastroesophageal reflux disease)    Headache(784.0)    "related to starting my periods" (09/11/2013)    Patient Active Problem List   Diagnosis Date Noted   Incisional hernia with obstruction 09/11/2013   Incisional hernia, without obstruction or gangrene 06/27/2013   Morbid obesity (HCC) 06/27/2013    Past Surgical History:  Procedure Laterality Date   CESAREAN SECTION  1993; 1997;  1998   CHOLECYSTECTOMY  1993   w/UHR   CYST EXCISION  1985   "end of my spine"   GASTRIC BYPASS  1999   HERNIA REPAIR     INSERTION OF MESH N/A 09/11/2013   Procedure: INSERTION OF MESH;  Surgeon: Ernestene Mention, MD;  Location: [facility] OR;  Service: General;  Laterality: N/A;   LAPAROSCOPIC INCISIONAL / UMBILICAL / VENTRAL HERNIA REPAIR  09/11/2013   VHR w/mesh and LOA    LYSIS OF ADHESION N/A 09/11/2013   Procedure: LAPAROSCOPIC LYSIS OF ADHESION;  Surgeon: Ernestene Mention, MD;  Location: [facility] OR;  Service: General;  Laterality: N/A;   TONSILLECTOMY  1972   TUBAL LIGATION  1998   UMBILICAL HERNIA REPAIR  1993   w/chole   VENTRAL HERNIA REPAIR N/A 09/11/2013   Procedure: LAPAROSCOPIC VENTRAL HERNIA REPAIR ;  Surgeon: Ernestene Mention, MD;  Location: [facility] OR;  Service: General;  Laterality: N/A;     OB History   No obstetric history on file.     Family History  Problem Relation Age of Onset   Cancer Mother        breast   Cancer Father        prostate   Cancer Sister        ovarian   Cancer Paternal Uncle        prostate   Cancer Maternal Grandmother        breast   Cancer Paternal Grandfather  prostate   Cancer Sister        ovarian    Social History   Tobacco Use   Smoking status: Never Smoker   Smokeless tobacco: Never Used  Substance Use Topics   Alcohol use: Yes    Comment: 09/11/2013 "glass of wine/month maybe"   Drug use: No    Home Medications Prior to Admission medications   Medication Sig Start Date End Date Taking? Authorizing Provider  cetirizine-pseudoephedrine (ZYRTEC-D) 5-120 MG tablet Take 1 tablet by mouth daily. 07/11/18   Dahlia ByesBast, Traci A, NP  clotrimazole (LOTRIMIN) 1 % cream Apply 1 application topically 2 (two) times daily. 07/11/18   Dahlia ByesBast, Traci A, NP  cyclobenzaprine (FLEXERIL) 10 MG tablet Take 1 tablet (10 mg total) by mouth 2 (two) times daily as needed for muscle spasms. DO NOT DRINK ALCOHOL OR DRIVE WHILE TAKING THIS  MEDICATION. MAY CAUSE DROWSINESS 05/16/20   Particia NearingLane, Rachel Tatyana Biber, PA-C  ergocalciferol (VITAMIN D2) 1.25 MG (50000 UT) capsule Take 50,000 Units by mouth once a week.    [provider]  metFORMIN (GLUCOPHAGE) 500 MG tablet Take 500 mg by mouth daily with breakfast.    [provider]  mupirocin nasal ointment (BACTROBAN) 2 % Apply below each nostril daily 06/20/18   Couture, Cortni S, PA-C  phentermine 15 MG capsule Take 15 mg by mouth every morning.    [provider]  predniSONE (DELTASONE) 20 MG tablet Take 2 tablets (40 mg total) by mouth daily with breakfast. 05/16/20   Particia NearingLane, Rachel Rifka Ramey, PA-C  pantoprazole (PROTONIX) 40 MG tablet daily. 10/27/13 04/10/20  [provider]  sertraline (ZOLOFT) 50 MG tablet Take 50 mg by mouth daily.  07/04/13 04/10/20  [provider]    Allergies    Patient has no known allergies.  Review of Systems   Review of Systems  Respiratory: Positive for shortness of breath.   All other systems reviewed and are negative.   Physical Exam Updated Vital Signs BP (!) 154/89    Pulse 94    Temp 98.5 F (36.9 C) (Oral)    Resp 16    Ht 5\' 5"  (1.651 m)    Wt 127 kg    SpO2 93%    BMI 46.59 kg/m   Physical Exam Vitals and nursing note reviewed.  Constitutional:      General: She is in acute distress.     Appearance: She is well-developed and well-nourished. She is ill-appearing.  HENT:     Head: Normocephalic and atraumatic.  Cardiovascular:     Rate and Rhythm: Regular rhythm. Tachycardia present.     Heart sounds: No murmur heard.   Pulmonary:     Effort: Pulmonary effort is normal. No respiratory distress.     Comments: Find crackles diffusely bilaterally. Abdominal:     Palpations: Abdomen is soft.     Tenderness: There is no abdominal tenderness. There is no guarding or rebound.  Musculoskeletal:        General: No tenderness or edema.     Comments: Trace edema to bilateral lower extremities  Skin:     General: Skin is warm and dry.  Neurological:     Mental Status: She is alert and oriented to person, place, and time.  Psychiatric:        Mood and Affect: Mood and affect normal.        Behavior: Behavior normal.     ED Results / Procedures / Treatments   Labs (all labs  ordered are listed, but only abnormal results are displayed) Labs Reviewed  LACTIC ACID, PLASMA - Abnormal; Notable for the following components:      Result Value   Lactic Acid, Venous 2.4 (*)    All other components within normal limits  CBC WITH DIFFERENTIAL/PLATELET - Abnormal; Notable for the following components:   WBC 17.4 (*)    Neutro Abs 14.8 (*)    Abs Immature Granulocytes 0.33 (*)    All other components within normal limits  COMPREHENSIVE METABOLIC PANEL - Abnormal; Notable for the following components:   Sodium 134 (*)    CO2 18 (*)    Glucose, Bld 148 (*)    Calcium 8.2 (*)    Albumin 2.4 (*)    AST 42 (*)    All other components within normal limits  D-DIMER, QUANTITATIVE (NOT AT [facility]) - Abnormal; Notable for the following components:   D-Dimer, Quant 6.05 (*)    All other components within normal limits  LACTATE DEHYDROGENASE - Abnormal; Notable for the following components:   LDH 541 (*)    All other components within normal limits  FIBRINOGEN - Abnormal; Notable for the following components:   Fibrinogen 614 (*)    All other components within normal limits  C-REACTIVE PROTEIN - Abnormal; Notable for the following components:   CRP 14.4 (*)    All other components within normal limits  CULTURE, BLOOD (ROUTINE X 2)  CULTURE, BLOOD (ROUTINE X 2)  PROCALCITONIN  FERRITIN  TRIGLYCERIDES  LACTIC ACID, PLASMA    EKG EKG Interpretation  Date/Time:  Friday May 24 2020 16:32:52 EST Ventricular Rate:  109 PR Interval:    QRS Duration: 91 QT Interval:  333 QTC Calculation: 449 R Axis:   -6 Text Interpretation: Sinus tachycardia No previous tracing Confirmed by [facility]  [id number]) on 05/24/2020 5:04:31 PM   Radiology CT Angio Chest PE W/Cm &/Or Wo Cm  Result Date: 05/24/2020 CLINICAL DATA:  Shortness of breath, COVID-19 positivity EXAM: CT ANGIOGRAPHY CHEST WITH CONTRAST TECHNIQUE: Multidetector CT imaging of the chest was performed using the standard protocol during bolus administration of intravenous contrast. Multiplanar CT image reconstructions and MIPs were obtained to evaluate the vascular anatomy. CONTRAST:  60mL OMNIPAQUE IOHEXOL 350 MG/ML SOLN COMPARISON:  Chest x-ray from earlier in the same day. FINDINGS: Cardiovascular: Atherosclerotic calcifications of the thoracic aorta are noted. No aneurysmal dilatation or dissection is seen. No cardiac enlargement is noted. No coronary calcifications are seen. The pulmonary artery is well visualized within normal branching pattern. No filling defect to suggest pulmonary embolism is noted. Mediastinum/Nodes: Thoracic inlet is within normal limits. Lymphadenopathy is noted in the right paratracheal region measuring 2.2 cm in short axis. Scattered smaller mediastinal nodes are seen. Small hilar nodes are noted as well. These are likely reactive in nature given the changes in the lungs. Lungs/Pleura: Lungs are well aerated bilaterally. Bilateral ground-glass airspace opacity is noted worst in the lung bases consistent with the given clinical history of COVID-19 positivity. This is similar to that seen on the prior chest x-ray. Upper Abdomen: Changes of prior gastric bypass. No other upper abdominal abnormality is noted. Musculoskeletal: Degenerative changes of the thoracic spine are noted. Review of the MIP images confirms the above findings. IMPRESSION: No evidence of pulmonary emboli. Diffuse ground-glass opacities bilaterally consistent with the given clinical history of COVID-19 positivity. Mediastinal lymphadenopathy likely reactive in nature to the changes within the lungs. Electronically Signed   By: [facility].D.  On:  05/24/2020 18:54   DG Chest Port 1 View  Result Date: 05/24/2020 CLINICAL DATA:  Shortness of breath, COVID EXAM: PORTABLE CHEST 1 VIEW COMPARISON:  None. FINDINGS: Fairly widespread bilateral ground-glass opacities and patchy consolidations in the mid to lower lungs. No pleural effusion. Normal cardiomediastinal silhouette. No pneumothorax. IMPRESSION: Fairly widespread bilateral ground-glass opacities and patchy consolidations in the mid to lower lungs, consistent with multifocal pneumonia. Electronically Signed   By: Jasmine Pang M.D.   On: 05/24/2020 17:34    Procedures Procedures (including critical care time) CRITICAL CARE Performed by: [facility]   Total critical care time: 35 minutes  Critical care time was exclusive of separately billable procedures and treating other patients.  Critical care was necessary to treat or prevent imminent or life-threatening deterioration.  Critical care was time spent personally by me on the following activities: development of treatment plan with patient and/or surrogate as well as nursing, discussions with consultants, evaluation of patient's response to treatment, examination of patient, obtaining history from patient or surrogate, ordering and performing treatments and interventions, ordering and review of laboratory studies, ordering and review of radiographic studies, pulse oximetry and re-evaluation of patient's condition.  Medications Ordered in ED Medications  dexamethasone (DECADRON) injection 10 mg (10 mg Intravenous Given 05/24/20 1738)  iohexol (OMNIPAQUE) 350 MG/ML injection 75 mL (75 mLs Intravenous Contrast Given 05/24/20 1847)    ED Course  I have reviewed the triage vital signs and the nursing notes.  Pertinent labs & imaging results that were available during my care of the patient were reviewed by me and considered in my medical decision making (see chart for details).    MDM Rules/Calculators/A&P                          patient here for evaluation of shortness of breath, tested positive for COVID-19 one week ago. On evaluation she has tachypnea, tachycardia, new oxygen requirement. Chest x-ray with multifocal infiltrates consistent with COVID-19 infection. CBC with leukocytosis. She has recently been treated with steroids and has a history of leukocytosis is in the past., Doubt acute bacterial infection. She does report asymmetric lower extremity swelling, D dimer with elevated. A CTA was obtained, which is negative for PE. She was treated with supplemental oxygen, steroids for hypoxic respiratory failure secondary to COVID-19 infection. Hospitalist consulted for admission for ongoing treatment.  Jahaira Earnhart was evaluated in Emergency Department on 05/24/2020 for the symptoms described in the history of present illness. She was evaluated in the context of the global COVID-19 pandemic, which necessitated consideration that the patient might be at risk for infection with the SARS-CoV-2 virus that causes COVID-19. Institutional protocols and algorithms that pertain to the evaluation of patients at risk for COVID-19 are in a state of rapid change based on information released by regulatory bodies including the CDC and federal and state organizations. These policies and algorithms were followed during the patient's care in the ED.   Final Clinical Impression(s) / ED Diagnoses Final diagnoses:  COVID-19 virus infection  Hypoxia    Rx / DC Orders ED Discharge Orders    None       Christina Fossa, MD 05/24/20 1910

## 2020-05-24 NOTE — ED Notes (Signed)
Dr. Madilyn Hook notified of lactic of 2.4.

## 2020-05-24 NOTE — ED Notes (Signed)
RT at bedside applying salters O2 tx to patient.

## 2020-05-24 NOTE — Progress Notes (Addendum)
Christina Castro [id number] Admission Data: 05/24/2020 11:51 PM Attending Provider: John Giovanni, MD  YKD:[id number], No Pcp Per Consults/ Treatment Team:   Christina Castro is a 50 y.o. female patient admitted from ED awake, alert  & orientated  X 3,  Full Code, VSS - Blood pressure (!) 142/89, pulse 98, temperature 98 F (36.7 C), temperature source Oral, resp. rate 20, height 5\' 5"  (1.651 m), weight (!) 138.9 kg, SpO2 100 %., O2    15 L High Flow Nasal cannula, c/o shortness of breath, no c/o chest pain, no distress noted. Placed on bedside monitor. placed and pt is currently running:normal sinus rhythm.   IV site WDL:  hand left, condition patent and no redness and forearm left, condition patent and no redness with a transparent dsg that's clean dry and intact.  Allergies:  No Known Allergies   Past Medical History:  Diagnosis Date  . 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 Depression    "S/P losing my daughter d/t rejection post lung transplant"  . GERD (gastroesophageal reflux disease)   . Headache(784.0)    "related to starting my periods" (09/11/2013)    History:  obtained from the patient. Tobacco/alcohol:  social drinker  Pt orientated to unit, room and routine. Information packet given to patient.  Admission INP armband ID verified with patient, and in place. SR up x 2, fall risk assessment complete with Patient and verbalizing understanding of risks associated with falls. Pt verbalizes an understanding of how to use the call bell and to call for help before getting out of bed.  Skin, clean-dry- intact without evidence of bruising, or skin tears.   No evidence of skin break down noted on exam. no rashes, no ecchymoses, no petechiae, no nodules, no jaundice, no purpura, no wounds, no acanthosis nigricans, no striae    Will cont to monitor and assist as needed.  11/11/2013 G Lynx Goodrich, RN 05/24/2020 11:51 PM

## 2020-05-24 NOTE — H&P (Signed)
History and Physical    Christina LefevreMary Rothman ZOX:[id number] DOB: 07/08/1970 DOA: 05/24/2020  PCP: Patient, No Pcp Per Patient coming from: Home  Chief Complaint: Shortness of breath, COVID-positive  HPI: Christina Castro is a 50 y.o. female with medical history significant of GERD, depression, morbid obesity (BMI 46.59) presenting the ED via EMS for evaluation of shortness of breath. Per EMS, her sats dropped to 65% on room air. She was placed on 12 L NRB and sats improved to 91%.  Patient states she did not get vaccinated for COVID as she stays at home mostly.  She did go to a funeral earlier this month and started feeling ill around January 6.  Her symptoms include fevers, fatigue, cough productive of clear sputum, and shortness of breath.  Denies chest pain.  States she tested positive for COVID at urgent care.  [facility] or 3 days ago as she was not feeling well and tested positive for COVID again.  She was also told that she was positive for flu.  She was prescribed prednisone, azithromycin, vitamin C, zinc, and vitamin D which she has been taking but has not improved.  She has a pulse ox at home and noticed that her oxygen saturation dropped to 79% on room and she could hardly catch her breath.  States by the time EMS arrived her oxygen saturation dropped to 60s.  She did have 2 episodes of diarrhea today but denies abdominal pain.  She had some nausea earlier which has resolved.  She has not vomited.  States she does not have diabetes and was prescribed metformin by her doctor to help her lose weight.  ED Course: Afebrile. Tachycardic and tachypneic. Not hypotensive. Desatted to mid 80s on 6 L supplemental oxygen in the ED and sats improved to the 90s after she was placed on 15 L HFNC. WBC 17.4 with neutrophilic predominance, hemoglobin 13.9, platelet count 322K. Sodium 134, potassium 4.2, chloride 104, bicarb 18, anion gap 12, BUN 6, creatinine 0.7, glucose 148.  No significant elevation of LFTs. Blood culture x2 pending. Lactic acid 2.4 >2.7. Procalcitonin 0.85. D-dimer 6.05. LDH 541. Fibrinogen 614. CRP 14.4. Chest x-ray showing bilateral groundglass opacities and patchy consolidations in the mid to lower lungs consistent with multifocal pneumonia. CT angiogram chest negative for PE but showing diffuse groundglass opacities bilaterally consistent with COVID-19 viral pneumonia. Patient was given IV Decadron 10 mg and remdesivir.  Review of Systems:  All systems reviewed and apart from history of presenting illness, are negative.  Past Medical History:  Diagnosis Date  . 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 Depression    "S/P losing my daughter d/t rejection post lung transplant"  . GERD (gastroesophageal reflux disease)   . Headache(784.0)    "related to starting my periods" (09/11/2013)    Past Surgical History:  Procedure Laterality Date  . CESAREAN SECTION  1993; 1997; 1998  . CHOLECYSTECTOMY  1993   w/UHR  . CYST EXCISION  1985   "end of my spine"  . GASTRIC BYPASS  1999  . HERNIA REPAIR    . INSERTION OF MESH N/A 09/11/2013   Procedure: INSERTION OF MESH;  Surgeon: Ernestene MentionHaywood M Ingram, MD;  [facility];  Service: General;  Laterality: N/A;  . LAPAROSCOPIC INCISIONAL / UMBILICAL / VENTRAL HERNIA REPAIR  09/11/2013   VHR w/mesh and LOA   . LYSIS OF ADHESION N/A 09/11/2013   Procedure: LAPAROSCOPIC LYSIS OF ADHESION;  Surgeon: Ernestene MentionHaywood M Ingram, MD;  [facility];  Service: General;  Laterality: N/A;  . TONSILLECTOMY  1972  . TUBAL LIGATION  1998  . UMBILICAL HERNIA REPAIR  1993   w/chole  . VENTRAL HERNIA REPAIR N/A 09/11/2013   Procedure: LAPAROSCOPIC VENTRAL HERNIA REPAIR ;  Surgeon: Ernestene MentionHaywood M Ingram, MD;  [facility];  Service: General;  Laterality: N/A;     reports that she has never smoked. She has never used smokeless tobacco. She reports current alcohol use. She reports that she does not use drugs.  No Known  Allergies  Family History  Problem Relation Age of Onset  . Cancer Mother        breast  . Cancer Father        prostate  . Cancer Sister        ovarian  . Cancer Paternal Uncle        prostate  . Cancer Maternal Grandmother        breast  . Cancer Paternal Grandfather        prostate  . Cancer Sister        ovarian    Prior to Admission medications   Medication Sig Start Date End Date Taking? Authorizing Provider  cetirizine-pseudoephedrine (ZYRTEC-D) 5-120 MG tablet Take 1 tablet by mouth daily. 07/11/18   Dahlia ByesBast, Traci A, NP  clotrimazole (LOTRIMIN) 1 % cream Apply 1 application topically 2 (two) times daily. 07/11/18   Dahlia ByesBast, Traci A, NP  cyclobenzaprine (FLEXERIL) 10 MG tablet Take 1 tablet (10 mg total) by mouth 2 (two) times daily as needed for muscle spasms. DO NOT DRINK ALCOHOL OR DRIVE WHILE TAKING THIS MEDICATION. MAY CAUSE DROWSINESS 05/16/20   Particia NearingLane, Rachel Elizabeth, PA-C  ergocalciferol (VITAMIN D2) 1.25 MG (50000 UT) capsule Take 50,000 Units by mouth once a week.    [provider]  metFORMIN (GLUCOPHAGE) 500 MG tablet Take 500 mg by mouth daily with breakfast.    [provider]  mupirocin nasal ointment (BACTROBAN) 2 % Apply below each nostril daily 06/20/18   Couture, Cortni S, PA-C  phentermine 15 MG capsule Take 15 mg by mouth every morning.    [provider]  predniSONE (DELTASONE) 20 MG tablet Take 2 tablets (40 mg total) by mouth daily with breakfast. 05/16/20   Particia NearingLane, Rachel Elizabeth, PA-C  pantoprazole (PROTONIX) 40 MG tablet daily. 10/27/13 04/10/20  [provider]  sertraline (ZOLOFT) 50 MG tablet Take 50 mg by mouth daily.  07/04/13 04/10/20  [provider]    Physical Exam: Vitals:   05/24/20 2045 05/24/20 2100 05/24/20 2130 05/24/20 2145  BP: 135/87 (!) 140/92 (!) 141/93 (!) 144/90  Pulse: 88 92 98 99  Resp: (!) 25 (!) 26 (!) 28 (!) 29  Temp:      TempSrc:      SpO2: 92% 91% 94% 93%  Weight:      Height:         Physical Exam Constitutional:      Appearance: She is ill-appearing and diaphoretic.  HENT:     Head: Normocephalic and atraumatic.  Eyes:     Extraocular Movements: Extraocular movements intact.     Conjunctiva/sclera: Conjunctivae normal.  Cardiovascular:     Rate and Rhythm: Regular rhythm.     Pulses: Normal pulses.     Comments: Slightly tachycardic Pulmonary:     Breath sounds: No wheezing.     Comments: Tachypneic with respiratory rate 25-30 Coarse breath sounds appreciated at  the bases bilaterally Satting above 90% on 15 L high flow nasal cannula Abdominal:     General: Bowel sounds are normal. There is no distension.     Palpations: Abdomen is soft.     Tenderness: There is no abdominal tenderness.  Musculoskeletal:        General: No tenderness.     Cervical back: Normal range of motion and neck supple.     Right lower leg: Edema present.     Left lower leg: Edema present.     Comments: Trace pedal edema bilaterally  Skin:    General: Skin is warm.  Neurological:     General: No focal deficit present.     Mental Status: She is alert and oriented to person, place, and time.     Labs on Admission: I have personally reviewed following labs and imaging studies  CBC: Recent Labs  Lab 05/24/20 1714  WBC 17.4*  NEUTROABS 14.8*  HGB 13.9  HCT 42.4  MCV 90.0  PLT 322   Basic Metabolic Panel: Recent Labs  Lab 05/24/20 1714  NA 134*  K 4.2  CL 104  CO2 18*  GLUCOSE 148*  BUN 6  CREATININE 0.74  CALCIUM 8.2*   GFR: Estimated Creatinine Clearance: 111.6 mL/min (by C-G formula based on SCr of 0.74 mg/dL). Liver Function Tests: Recent Labs  Lab 05/24/20 1714  AST 42*  ALT 21  ALKPHOS 67  BILITOT 0.6  PROT 6.6  ALBUMIN 2.4*   No results for input(s): LIPASE, AMYLASE in the last 168 hours. No results for input(s): AMMONIA in the last 168 hours. Coagulation Profile: No results for input(s): INR, PROTIME in the last 168 hours. Cardiac  Enzymes: No results for input(s): CKTOTAL, CKMB, CKMBINDEX, TROPONINI in the last 168 hours. BNP (last 3 results) No results for input(s): PROBNP in the last 8760 hours. HbA1C: No results for input(s): HGBA1C in the last 72 hours. CBG: No results for input(s): GLUCAP in the last 168 hours. Lipid Profile: Recent Labs    05/24/20 1714  TRIG 104   Thyroid Function Tests: No results for input(s): TSH, T4TOTAL, FREET4, T3FREE, THYROIDAB in the last 72 hours. Anemia Panel: Recent Labs    05/24/20 1714  FERRITIN 147   Urine analysis: No results found for: COLORURINE, APPEARANCEUR, LABSPEC, PHURINE, GLUCOSEU, HGBUR, BILIRUBINUR, KETONESUR, PROTEINUR, UROBILINOGEN, NITRITE, LEUKOCYTESUR  Radiological Exams on Admission: CT Angio Chest PE W/Cm &/Or Wo Cm  Result Date: 05/24/2020 CLINICAL DATA:  Shortness of breath, COVID-19 positivity EXAM: CT ANGIOGRAPHY CHEST WITH CONTRAST TECHNIQUE: Multidetector CT imaging of the chest was performed using the standard protocol during bolus administration of intravenous contrast. Multiplanar CT image reconstructions and MIPs were obtained to evaluate the vascular anatomy. CONTRAST:  34mL OMNIPAQUE IOHEXOL 350 MG/ML SOLN COMPARISON:  Chest x-ray from earlier in the same day. FINDINGS: Cardiovascular: Atherosclerotic calcifications of the thoracic aorta are noted. No aneurysmal dilatation or dissection is seen. No cardiac enlargement is noted. No coronary calcifications are seen. The pulmonary artery is well visualized within normal branching pattern. No filling defect to suggest pulmonary embolism is noted. Mediastinum/Nodes: Thoracic inlet is within normal limits. Lymphadenopathy is noted in the right paratracheal region measuring 2.2 cm in short axis. Scattered smaller mediastinal nodes are seen. Small hilar nodes are noted as well. These are likely reactive in nature given the changes in the lungs. Lungs/Pleura: Lungs are well aerated bilaterally. Bilateral  ground-glass airspace opacity is noted worst in the lung bases consistent with the given  clinical history of COVID-19 positivity. This is similar to that seen on the prior chest x-ray. Upper Abdomen: Changes of prior gastric bypass. No other upper abdominal abnormality is noted. Musculoskeletal: Degenerative changes of the thoracic spine are noted. Review of the MIP images confirms the above findings. IMPRESSION: No evidence of pulmonary emboli. Diffuse ground-glass opacities bilaterally consistent with the given clinical history of COVID-19 positivity. Mediastinal lymphadenopathy likely reactive in nature to the changes within the lungs. Electronically Signed   By: Alcide Clever M.D.   On: 05/24/2020 18:54   DG Chest Port 1 View  Result Date: 05/24/2020 CLINICAL DATA:  Shortness of breath, COVID EXAM: PORTABLE CHEST 1 VIEW COMPARISON:  None. FINDINGS: Fairly widespread bilateral ground-glass opacities and patchy consolidations in the mid to lower lungs. No pleural effusion. Normal cardiomediastinal silhouette. No pneumothorax. IMPRESSION: Fairly widespread bilateral ground-glass opacities and patchy consolidations in the mid to lower lungs, consistent with multifocal pneumonia. Electronically Signed   By: Jasmine Pang M.D.   On: 05/24/2020 17:34    EKG: Independently reviewed. Sinus tachycardia, no prior tracing for comparison.  Assessment/Plan Principal Problem:   Pneumonia due to COVID-19 virus Active Problems:   Morbid obesity (HCC)   Severe sepsis (HCC)   Acute hypoxemic respiratory failure (HCC)   Diarrhea   Severe sepsis and acute hypoxemic respiratory failure secondary to COVID-19 viral pneumonia: Meets criteria for severe sepsis -3 SIRS (tachycardia, tachypnea, leukocytosis), SARS-CoV-2 PCR test positive on 05/16/2020, and lactic acidosis on labs.  Inflammatory markers elevated: D-dimer 6.05, CRP 14.4.  CT angiogram chest negative for PE but showing diffuse groundglass opacities bilaterally  consistent with COVID-19 viral pneumonia.  Patient is not vaccinated against COVID.  Desatted to mid 60s on room air with EMS, currently requiring 15 L HFNC to maintain sats above 90%. -Remdesivir -IV Solu-Medrol 0.5 mg/kg every 12 hours -Discussed FDA's emergency use authorization of baricitinib for the treatment of Covid infection in hospitalized patients requiring supplemental oxygen.  Patient denies history of hepatitis, HIV, malignancy, or active TB.  Risks versus benefits discussed with the patient and she wishes to proceed with using baricitinib to treat her illness.  Start baricitinib 4 mg daily. -Although suspicion for bacterial pneumonia is low, procalcitonin elevated at 0.85. WBC 17.4 with neutrophilic predominance.  Will start antibiotics for coverage of possible CAP and continue to trend WBC count and procalcitonin level. -Given worsening lactic acidosis, will order 1 L fluid bolus and repeat lactic acid level.  Goal is to maintain euvolemic to net negative fluid balance given COVID-19 viral infection. -Vitamin C, zinc, vitamin D -Antitussives as needed -Tylenol as needed -Bronchodilator as needed -Daily CBC with differential, CMP, CRP, D-dimer -Airborne and contact precautions -Incentive spirometry, flutter valve -Encourage prone positioning -Continuous pulse ox -Supplemental oxygen as needed to keep oxygen saturation above 90% -Blood culture x2 pending -Bilateral lower extremity Dopplers ordered to rule out DVT given elevated D-dimer level.  Will start full dose anticoagulation and continue to trend D-dimer level.  ?Influenza infection: Per patient, she recently tested positive for influenza at [facility] about 2 or 3 days ago.  It does not seem she was prescribed Tamiflu. -Stat influenza panel ordered to confirm.  If positive, start treatment.  Diarrhea: Likely due to COVID-19 viral infection, however, she has been taking azithromycin at home and has leukocytosis on  labs.  Abdominal exam benign. -C. difficile PCR, enteric precautions  Normal anion gap metabolic acidosis: Likely due to diarrhea.  Bicarb 18, anion gap 12. -  IV fluid, continue to monitor  Morbid obesity -Encourage lifestyle modifications -healthy eating and exercise to achieve weight loss after patient recovers from her viral illness.  DVT prophylaxis: Lovenox (full dose anticoagulation) Code Status: Full code Family Communication: No family available this time. Disposition Plan: Status is: Inpatient  Remains inpatient appropriate because:IV treatments appropriate due to intensity of illness or inability to take PO, Inpatient level of care appropriate due to severity of illness and Severe sepsis and acute hypoxemic respiratory failure secondary to COVID-19 viral pneumonia   Dispo: The patient is from: Home              Anticipated d/c is to: Home              Anticipated d/c date is: > 3 days              Patient currently is not medically stable to d/c.   Difficult to place patient No  The medical decision making on this patient was of high complexity and the patient is at high risk for clinical deterioration, therefore this is a level 3 visit.  John Giovanni MD [facility]  If 7PM-7AM, please contact night-coverage [email or web address]  05/24/2020, 10:25 PM

## 2020-05-25 ENCOUNTER — Inpatient Hospital Stay (HOSPITAL_COMMUNITY): Payer: 59

## 2020-05-25 ENCOUNTER — Encounter (HOSPITAL_COMMUNITY): Payer: Self-pay | Admitting: Internal Medicine

## 2020-05-25 DIAGNOSIS — R7989 Other specified abnormal findings of blood chemistry: Secondary | ICD-10-CM

## 2020-05-25 DIAGNOSIS — U071 COVID-19: Secondary | ICD-10-CM

## 2020-05-25 LAB — CBC WITH DIFFERENTIAL/PLATELET
Abs Immature Granulocytes: 0.17 10*3/uL — ABNORMAL HIGH (ref 0.00–0.07)
Basophils Absolute: 0 10*3/uL (ref 0.0–0.1)
Basophils Relative: 0 %
Eosinophils Absolute: 0 10*3/uL (ref 0.0–0.5)
Eosinophils Relative: 0 %
HCT: 38 % (ref 36.0–46.0)
Hemoglobin: 13.2 g/dL (ref 12.0–15.0)
Immature Granulocytes: 1 %
Lymphocytes Relative: 11 %
Lymphs Abs: 1.5 10*3/uL (ref 0.7–4.0)
MCH: 30.6 pg (ref 26.0–34.0)
MCHC: 34.7 g/dL (ref 30.0–36.0)
MCV: 88 fL (ref 80.0–100.0)
Monocytes Absolute: 0.4 10*3/uL (ref 0.1–1.0)
Monocytes Relative: 3 %
Neutro Abs: 11.8 10*3/uL — ABNORMAL HIGH (ref 1.7–7.7)
Neutrophils Relative %: 85 %
Platelets: 280 10*3/uL (ref 150–400)
RBC: 4.32 MIL/uL (ref 3.87–5.11)
RDW: 14.2 % (ref 11.5–15.5)
WBC: 13.9 10*3/uL — ABNORMAL HIGH (ref 4.0–10.5)
nRBC: 0.1 % (ref 0.0–0.2)

## 2020-05-25 LAB — COMPREHENSIVE METABOLIC PANEL
ALT: 17 U/L (ref 0–44)
AST: 39 U/L (ref 15–41)
Albumin: 2.3 g/dL — ABNORMAL LOW (ref 3.5–5.0)
Alkaline Phosphatase: 68 U/L (ref 38–126)
Anion gap: 14 (ref 5–15)
BUN: 7 mg/dL (ref 6–20)
CO2: 16 mmol/L — ABNORMAL LOW (ref 22–32)
Calcium: 7.9 mg/dL — ABNORMAL LOW (ref 8.9–10.3)
Chloride: 111 mmol/L (ref 98–111)
Creatinine, Ser: 0.73 mg/dL (ref 0.44–1.00)
GFR, Estimated: >60 mL/min (ref >60)
Glucose, Bld: 148 mg/dL — ABNORMAL HIGH (ref 70–99)
Potassium: 4.8 mmol/L (ref 3.5–5.1)
Sodium: 141 mmol/L (ref 135–145)
Total Bilirubin: 0.4 mg/dL (ref 0.3–1.2)
Total Protein: 6.2 g/dL — ABNORMAL LOW (ref 6.5–8.1)

## 2020-05-25 LAB — D-DIMER, QUANTITATIVE: D-Dimer, Quant: 8.58 ug/mL-FEU — ABNORMAL HIGH (ref 0.00–0.50)

## 2020-05-25 LAB — RESP PANEL BY RT-PCR (RSV, FLU A&B, COVID)  RVPGX2
Influenza A by PCR: NEGATIVE
Influenza B by PCR: NEGATIVE
Resp Syncytial Virus by PCR: NEGATIVE
SARS Coronavirus 2 by RT PCR: POSITIVE — AB

## 2020-05-25 LAB — BRAIN NATRIURETIC PEPTIDE: B Natriuretic Peptide: 52.3 pg/mL (ref 0.0–100.0)

## 2020-05-25 LAB — HIV ANTIBODY (ROUTINE TESTING W REFLEX): HIV Screen 4th Generation wRfx: NONREACTIVE

## 2020-05-25 LAB — C-REACTIVE PROTEIN: CRP: 14.3 mg/dL — ABNORMAL HIGH (ref <1.0)

## 2020-05-25 LAB — LACTIC ACID, PLASMA: Lactic Acid, Venous: 2.5 mmol/L (ref 0.5–1.9)

## 2020-05-25 MED ORDER — DM-GUAIFENESIN ER 30-600 MG PO TB12
1.0000 | ORAL_TABLET | Freq: Two times a day (BID) | ORAL | Status: DC | PRN
  Administered 2020-05-25 – 2020-05-27 (×3): 1 via ORAL
  Filled 2020-05-25 (×3): qty 1

## 2020-05-25 MED ORDER — METHYLPREDNISOLONE SODIUM SUCC 40 MG IJ SOLR
40.0000 mg | Freq: Three times a day (TID) | INTRAMUSCULAR | Status: DC
  Administered 2020-05-25 – 2020-05-28 (×9): 40 mg via INTRAVENOUS
  Filled 2020-05-25 (×9): qty 1

## 2020-05-25 MED ORDER — IPRATROPIUM-ALBUTEROL 20-100 MCG/ACT IN AERS
1.0000 | INHALATION_SPRAY | Freq: Four times a day (QID) | RESPIRATORY_TRACT | Status: DC
  Administered 2020-05-25 – 2020-05-28 (×14): 1 via RESPIRATORY_TRACT
  Filled 2020-05-25: qty 4

## 2020-05-25 MED ORDER — SENNOSIDES-DOCUSATE SODIUM 8.6-50 MG PO TABS: 1.0000 | ORAL_TABLET | Freq: Every evening | ORAL | Status: DC | PRN

## 2020-05-25 MED ORDER — ACETAMINOPHEN 325 MG PO TABS: 650.0000 mg | ORAL_TABLET | Freq: Four times a day (QID) | ORAL | Status: DC | PRN

## 2020-05-25 MED ORDER — INFLUENZA VAC SPLIT QUAD 0.5 ML IM SUSY
0.5000 mL | PREFILLED_SYRINGE | INTRAMUSCULAR | Status: DC
  Filled 2020-05-25: qty 0.5

## 2020-05-25 NOTE — Evaluation (Signed)
Physical Therapy Evaluation Patient Details Name: Christina Castro MRN: [id number] DOB: [birth year] Today's Date: 05/25/2020   History of Present Illness  50 y.o. female admitted on 05/24/20 for SOB, dx with COVID 19 PNA, sepsis, acute hypoxic respiratory failure (initial COVID + was 05/16/20), negative PE, + R LE DVT, diarrhea.  Pt with significant PMH of obesity, HA, depression, anemia, gastric bypass, multiple hernia repairs.  Clinical Impression  Pt with normal strength, but low activity tolerance due to decreased sats, increased WOB/DOE during multiple transfers to [facility], chair, and ultimately back to bed (she was up all AM).  Pt with 3/4 DOE and delayed drop in O2 (dropping most after mobility once seated again) to low 80s on 15L HFNC.  Pt is able to recover given 3 mins of pursed lip breathing.  We practiced her IS and flutter valve and I encouraged her to get up later after a short rest (she reports MD wants her OOB 8 hours).  She will likely do well from a mobility standpoint, it will be weaning her O2 and her endurance that will need the most work.   PT to follow acutely for deficits listed below.     Follow Up Recommendations No PT follow up    Equipment Recommendations  Other (comment) (possible home O2)    Recommendations for Other Services       Precautions / Restrictions Precautions Precautions: Other (comment) Precaution Comments: monitor O2 sats      Mobility  Bed Mobility Overal bed mobility: Modified Independent             General bed mobility comments: HOB elevated and used rails.    Transfers Overall transfer level: Needs assistance Equipment used: None Transfers: Sit to/from [employer] Sit to Stand: Supervision Stand pivot transfers: Supervision       General transfer comment: supervision for safety and line management.  Ambulation/Gait             General Gait Details: too dyspnic with transfers only for gait today.  Stairs             Wheelchair Mobility    Modified Rankin (Stroke Patients Only)       Balance Overall balance assessment: Mild deficits observed, not formally tested                                           Pertinent Vitals/Pain Pain Assessment: No/denies pain    Home Living Family/patient expects to be discharged to:: Private residence Living Arrangements: Spouse/significant other Available Help at Discharge: Family Type of Home: House                Prior Function Level of Independence: Independent         Comments: completely independent, works from home (talking on the phone-this is when she knew she was in trouble as she could not talk at work without getting SOB).     Hand Dominance   Dominant Hand: Right    Extremity/Trunk Assessment   Upper Extremity Assessment Upper Extremity Assessment: Overall WFL for tasks assessed    Lower Extremity Assessment Lower Extremity Assessment: Overall WFL for tasks assessed    Cervical / Trunk Assessment Cervical / Trunk Assessment: Normal  Communication   Communication: No difficulties  Cognition Arousal/Alertness: Awake/alert Behavior During Therapy: WFL for tasks assessed/performed Overall Cognitive Status: Within Functional Limits for tasks  assessed                                        General Comments      Exercises Other Exercises Other Exercises: Pt compliant with IS, has not opened flutter valve, so we opened it and practiced 5 reps each, encouraged 10 each and hour (even if she had to break it up as 5 was about all she could tolerate at one time).   Assessment/Plan    PT Assessment Patient needs continued PT services  PT Problem List Decreased activity tolerance;Decreased knowledge of use of DME;Decreased knowledge of precautions;Cardiopulmonary status limiting activity;Obesity       PT Treatment Interventions Gait training;DME instruction;Functional mobility  training;Stair training;Therapeutic activities;Therapeutic exercise;Balance training;Patient/family education    PT Goals (Current goals can be found in the Care Plan section)  Acute Rehab PT Goals Patient Stated Goal: to get her breathing better so she can go home. PT Goal Formulation: With patient Time For Goal Achievement: 06/08/20 Potential to Achieve Goals: Good    Frequency Min 3X/week   Barriers to discharge        Co-evaluation               AM-PAC PT "6 Clicks" Mobility  Outcome Measure Help needed turning from your back to your side while in a flat bed without using bedrails?: A Little Help needed moving from lying on your back to sitting on the side of a flat bed without using bedrails?: A Little Help needed moving to and from a bed to a chair (including a wheelchair)?: A Little Help needed standing up from a chair using your arms (e.g., wheelchair or bedside chair)?: A Little Help needed to walk in hospital room?: A Little Help needed climbing 3-5 steps with a railing? : A Little 6 Click Score: 18    End of Session Equipment Utilized During Treatment: Oxygen (15L HFNC) Activity Tolerance: Patient limited by fatigue Patient left: in bed;with call bell/phone within reach   PT Visit Diagnosis: Muscle weakness (generalized) (M62.81);Difficulty in walking, not elsewhere classified (R26.2)    Time: 0272-5366 PT Time Calculation (min) (ACUTE ONLY): 19 min   Charges:   PT Evaluation $PT Eval Moderate Complexity: 1 Mod         Corinna Capra, PT, DPT  Acute Rehabilitation [phone] pager [phone]) [phone] office

## 2020-05-25 NOTE — Progress Notes (Signed)
PROGRESS NOTE    Christina LefevreMary Castro  ZOX:[id number] DOB: 07/03/1970 DOA: 05/24/2020 PCP: Patient, No Pcp Per   Brief Narrative:  50 year old with history of GERD, depression, morbid obesity came to the ER with complaints of shortness of breath and hypoxia diagnosed with COVID-19 pneumonia. She was started on supplemental oxygen, steroids, remdesivir and baricitinib. CTA was negative for PE but showed diffuse groundglass opacities   Assessment & Plan:   Principal Problem:   Pneumonia due to COVID-19 virus Active Problems:   Morbid obesity (HCC)   Severe sepsis (HCC)   Acute hypoxemic respiratory failure (HCC)   Diarrhea   Severe sepsis; improved Acute hypoxic respiratory failure secondary to COVID-19 pneumonia (Initially tested positive 05/16/20) -Oxygen levels- 15 L high flow -Remdesivir-D2 -Solumedrol 40mg  q8hrs - D2 -Baricitinib- D2 -Antibiotics- doxycycline -procalcitonin- 0.8 -Chest x-ray/CTA chest- negative PE, diffuse groundglass opacity, mediastinal LN -Supportive care-antitussive, inhalers, I-S/flutter -CODE STATUS confirmed -Vitamin C & Zinc. Prone >16hrs/day.  -Routine: Labs have been reviewed including ferritin, LDH, CRP, d-dimer, fibrinogen.  Will need to trend this lab daily.  Age-indeterminate right posterior tibial vein DVT - Lovenox 1 mg/kg started  Diarrhea, COVID-related - Supportive care.  Has not had a bowel movement since being in the hospital.  If continues will rule out C. difficile  Morbid obesity BMI >50 -Weight loss and exercise  GERD - Not on home meds  Depression -Not on home meds   DVT prophylaxis: Lovenox-full dose Code Status: Full code Family Communication:  Rica RecordsCalled Juan, no answer.   Status is: Inpatient  Remains inpatient appropriate because:Inpatient level of care appropriate due to severity of illness   Dispo: The patient is from: Home              Anticipated d/c is to: Home              Anticipated d/c date is: 2 days               Patient currently is not medically stable to d/c.   Difficult to place patient No       Body mass index is 50.96 kg/m.     Subjective: Feels ok, no complaints besides sob with exertion. She does not want intubation if her breathing worsens but ok with CPR  Review of Systems Otherwise negative except as per HPI, including: General: Denies fever, chills, night sweats or unintended weight loss. Resp: Denies c hemoptysis Cardiac: Denies chest pain, palpitations, orthopnea, paroxysmal nocturnal dyspnea. GI: Denies abdominal pain, nausea, vomiting, diarrhea or constipation GU: Denies dysuria, frequency, hesitancy or incontinence MS: Denies muscle aches, joint pain or swelling Neuro: Denies headache, neurologic deficits (focal weakness, numbness, tingling), abnormal gait Psych: Denies anxiety, depression, SI/HI/AVH Skin: Denies new rashes or lesions ID: Denies sick contacts, exotic exposures, travel  Examination:  General exam: Appears calm and comfortable on 15 L nasal cannula Respiratory system: Mild bilateral rhonchi Cardiovascular system: S1 & S2 heard, RRR. No JVD, murmurs, rubs, gallops or clicks. No pedal edema. Gastrointestinal system: Abdomen is nondistended, soft and nontender. No organomegaly or masses felt. Normal bowel sounds heard. Central nervous system: Alert and oriented. No focal neurological deficits. Extremities: Symmetric 5 x 5 power. Skin: No rashes, lesions or ulcers Psychiatry: Judgement and insight appear normal. Mood & affect appropriate.     Objective: Vitals:   05/24/20 2145 05/24/20 2256 05/25/20 0009 05/25/20 0442  BP: (!) 144/90 (!) 142/89 119/86 128/70  Pulse: 99 98 98 87  Resp: (!) 29 20 (!) 24 (!)  24  Temp:  98 F (36.7 C) 97.7 F (36.5 C) (!) 97.5 F (36.4 C)  TempSrc:  Oral Oral Oral  SpO2: 93% 100% 98% 98%  Weight:  (!) 138.9 kg    Height:  5\' 5"  (1.651 m)      Intake/Output Summary (Last 24 hours) at 05/25/2020 0814 Last  data filed at 05/25/2020 0400 Gross per 24 hour  Intake 1840 ml  Output --  Net 1840 ml   Filed Weights   05/24/20 1637 05/24/20 2256  Weight: 127 kg (!) 138.9 kg     Data Reviewed:   CBC: Recent Labs  Lab 05/24/20 1714  WBC 17.4*  NEUTROABS 14.8*  HGB 13.9  HCT 42.4  MCV 90.0  PLT 322   Basic Metabolic Panel: Recent Labs  Lab 05/24/20 1714 05/25/20 0448  NA 134* 141  K 4.2 4.8  CL 104 111  CO2 18* 16*  GLUCOSE 148* 148*  BUN 6 7  CREATININE 0.74 0.73  CALCIUM 8.2* 7.9*   GFR: Estimated Creatinine Clearance: 117.9 mL/min (by C-G formula based on SCr of 0.73 mg/dL). Liver Function Tests: Recent Labs  Lab 05/24/20 1714 05/25/20 0448  AST 42* 39  ALT 21 17  ALKPHOS 67 68  BILITOT 0.6 0.4  PROT 6.6 6.2*  ALBUMIN 2.4* 2.3*   No results for input(s): LIPASE, AMYLASE in the last 168 hours. No results for input(s): AMMONIA in the last 168 hours. Coagulation Profile: No results for input(s): INR, PROTIME in the last 168 hours. Cardiac Enzymes: No results for input(s): CKTOTAL, CKMB, CKMBINDEX, TROPONINI in the last 168 hours. BNP (last 3 results) No results for input(s): PROBNP in the last 8760 hours. HbA1C: No results for input(s): HGBA1C in the last 72 hours. CBG: No results for input(s): GLUCAP in the last 168 hours. Lipid Profile: Recent Labs    05/24/20 1714  TRIG 104   Thyroid Function Tests: No results for input(s): TSH, T4TOTAL, FREET4, T3FREE, THYROIDAB in the last 72 hours. Anemia Panel: Recent Labs    05/24/20 1714  FERRITIN 147   Sepsis Labs: Recent Labs  Lab 05/24/20 1714 05/24/20 1858 05/24/20 2221  PROCALCITON 0.85  --   --   LATICACIDVEN 2.4* 2.7* 2.5*    Recent Results (from the past 240 hour(s))  SARS CORONAVIRUS 2 (TAT 6-24 HRS) Nasopharyngeal Nasopharyngeal Swab     Status: Abnormal   Collection Time: 05/16/20 11:17 AM   Specimen: Nasopharyngeal Swab  Result Value Ref Range Status   SARS Coronavirus 2 POSITIVE (A)  NEGATIVE Final    Comment: (NOTE) SARS-CoV-2 target nucleic acids are DETECTED.  The SARS-CoV-2 RNA is generally detectable in upper and lower respiratory specimens during the acute phase of infection. Positive results are indicative of the presence of SARS-CoV-2 RNA. Clinical correlation with patient history and other diagnostic information is  necessary to determine patient infection status. Positive results do not rule out bacterial infection or co-infection with other viruses.  The expected result is Negative.  Fact Sheet for Patients: 05/18/20  Fact Sheet for Healthcare Providers: HairSlick.no  This test is not yet approved or cleared by the [place] FDA and  has been authorized for detection and/or diagnosis of SARS-CoV-2 by FDA under an Emergency Use Authorization (EUA). This EUA will remain  in effect (meaning this test can be used) for the duration of the COVID-19 declaration under Section 564(b)(1) of the Act, 21 U. S.C. section 360bbb-3(b)(1), unless the authorization is terminated or revoked sooner.  Performed at [facility], [address] [place], [place] [address]   Resp panel by RT-PCR (RSV, Flu A&B, Covid)     Status: Abnormal   Collection Time: 05/25/20  4:17 AM  Result Value Ref Range Status   SARS Coronavirus 2 by RT PCR POSITIVE (A) NEGATIVE Final    Comment: RESULT CALLED TO, READ BACK BY AND VERIFIED WITH: J TOBIAS RN 05/25/20 0545 JDW (NOTE) SARS-CoV-2 target nucleic acids are DETECTED.  The SARS-CoV-2 RNA is generally detectable in upper respiratory specimens during the acute phase of infection. Positive results are indicative of the presence of the identified virus, but do not rule out bacterial infection or co-infection with other pathogens not detected by the test. Clinical correlation with patient history and other diagnostic information is necessary to determine  patient infection status. The expected result is Negative.  Fact Sheet for Patients: BloggerCourse.com  Fact Sheet for Healthcare Providers: SeriousBroker.it  This test is not yet approved or cleared by the [place] FDA and  has been authorized for detection and/or diagnosis of SARS-CoV-2 by FDA under an Emergency Use Authorization (EUA).  This EUA will remain in effect (meaning this test can be used ) for the duration of  the COVID-19 declaration under Section 564(b)(1) of the Act, 21 U.S.C. section 360bbb-3(b)(1), unless the authorization is terminated or revoked sooner.     Influenza A by PCR NEGATIVE NEGATIVE Final   Influenza B by PCR NEGATIVE NEGATIVE Final    Comment: (NOTE) The Xpert Xpress SARS-CoV-2/FLU/RSV plus assay is intended as an aid in the diagnosis of influenza from Nasopharyngeal swab specimens and should not be used as a sole basis for treatment. Nasal washings and aspirates are unacceptable for Xpert Xpress SARS-CoV-2/FLU/RSV testing.  Fact Sheet for Patients: BloggerCourse.com  Fact Sheet for Healthcare Providers: SeriousBroker.it  This test is not yet approved or cleared by the [place] FDA and has been authorized for detection and/or diagnosis of SARS-CoV-2 by FDA under an Emergency Use Authorization (EUA). This EUA will remain in effect (meaning this test can be used) for the duration of the COVID-19 declaration under Section 564(b)(1) of the Act, 21 U.S.C. section 360bbb-3(b)(1), unless the authorization is terminated or revoked.     Resp Syncytial Virus by PCR NEGATIVE NEGATIVE Final    Comment: (NOTE) Fact Sheet for Patients: BloggerCourse.com  Fact Sheet for Healthcare Providers: SeriousBroker.it  This test is not yet approved or cleared by the [place] FDA and has been  authorized for detection and/or diagnosis of SARS-CoV-2 by FDA under an Emergency Use Authorization (EUA). This EUA will remain in effect (meaning this test can be used) for the duration of the COVID-19 declaration under Section 564(b)(1) of the Act, 21 U.S.C. section 360bbb-3(b)(1), unless the authorization is terminated or revoked.  Performed at [facility], [address] [place], [place] [address]          Radiology Studies: CT Angio Chest PE W/Cm &/Or Wo Cm  Result Date: 05/24/2020 CLINICAL DATA:  Shortness of breath, COVID-19 positivity EXAM: CT ANGIOGRAPHY CHEST WITH CONTRAST TECHNIQUE: Multidetector CT imaging of the chest was performed using the standard protocol during bolus administration of intravenous contrast. Multiplanar CT image reconstructions and MIPs were obtained to evaluate the vascular anatomy. CONTRAST:  39mL OMNIPAQUE IOHEXOL 350 MG/ML SOLN COMPARISON:  Chest x-ray from earlier in the same day. FINDINGS: Cardiovascular: Atherosclerotic calcifications of the thoracic aorta are noted. No aneurysmal dilatation or dissection is seen. No cardiac  enlargement is noted. No coronary calcifications are seen. The pulmonary artery is well visualized within normal branching pattern. No filling defect to suggest pulmonary embolism is noted. Mediastinum/Nodes: Thoracic inlet is within normal limits. Lymphadenopathy is noted in the right paratracheal region measuring 2.2 cm in short axis. Scattered smaller mediastinal nodes are seen. Small hilar nodes are noted as well. These are likely reactive in nature given the changes in the lungs. Lungs/Pleura: Lungs are well aerated bilaterally. Bilateral ground-glass airspace opacity is noted worst in the lung bases consistent with the given clinical history of COVID-19 positivity. This is similar to that seen on the prior chest x-ray. Upper Abdomen: Changes of prior gastric bypass. No other upper abdominal abnormality is noted.  Musculoskeletal: Degenerative changes of the thoracic spine are noted. Review of the MIP images confirms the above findings. IMPRESSION: No evidence of pulmonary emboli. Diffuse ground-glass opacities bilaterally consistent with the given clinical history of COVID-19 positivity. Mediastinal lymphadenopathy likely reactive in nature to the changes within the lungs. Electronically Signed   By: Alcide Clever M.D.   On: 05/24/2020 18:54   DG Chest Port 1 View  Result Date: 05/24/2020 CLINICAL DATA:  Shortness of breath, COVID EXAM: PORTABLE CHEST 1 VIEW COMPARISON:  None. FINDINGS: Fairly widespread bilateral ground-glass opacities and patchy consolidations in the mid to lower lungs. No pleural effusion. Normal cardiomediastinal silhouette. No pneumothorax. IMPRESSION: Fairly widespread bilateral ground-glass opacities and patchy consolidations in the mid to lower lungs, consistent with multifocal pneumonia. Electronically Signed   By: Jasmine Pang M.D.   On: 05/24/2020 17:34        Scheduled Meds: . vitamin C  500 mg Oral Daily  . baricitinib  4 mg Oral Daily  . cholecalciferol  1,000 Units Oral Daily  . enoxaparin (LOVENOX) injection  1 mg/kg Subcutaneous Q12H  . [START ON 05/26/2020] influenza vac split quadrivalent PF  0.5 mL Intramuscular Tomorrow-1000  . methylPREDNISolone (SOLU-MEDROL) injection  0.5 mg/kg Intravenous Q12H   Followed by  . [START ON 05/28/2020] predniSONE  50 mg Oral Daily  . zinc sulfate  220 mg Oral Daily   Continuous Infusions: . cefTRIAXone (ROCEPHIN)  IV 1 g (05/25/20 0032)  . doxycycline (VIBRAMYCIN) IV 100 mg (05/25/20 0045)  . remdesivir 100 mg in NS 100 mL       LOS: 1 day   Time spent= 35 mins    Kermitt Harjo Joline Maxcy, MD [facility]  If 7PM-7AM, please contact night-coverage  05/25/2020, 8:14 AM

## 2020-05-25 NOTE — Progress Notes (Signed)
Lower extremity venous bilateral study completed.  Preliminary results relayed to Nelson Chimes, MD.   See CV Proc for preliminary results report.   Jean Rosenthal, RDMS

## 2020-05-26 LAB — C-REACTIVE PROTEIN: CRP: 6.8 mg/dL — ABNORMAL HIGH (ref <1.0)

## 2020-05-26 LAB — BASIC METABOLIC PANEL
Anion gap: 10 (ref 5–15)
BUN: 13 mg/dL (ref 6–20)
CO2: 21 mmol/L — ABNORMAL LOW (ref 22–32)
Calcium: 8.4 mg/dL — ABNORMAL LOW (ref 8.9–10.3)
Chloride: 107 mmol/L (ref 98–111)
Creatinine, Ser: 0.84 mg/dL (ref 0.44–1.00)
GFR, Estimated: >60 mL/min (ref >60)
Glucose, Bld: 138 mg/dL — ABNORMAL HIGH (ref 70–99)
Potassium: 4.3 mmol/L (ref 3.5–5.1)
Sodium: 138 mmol/L (ref 135–145)

## 2020-05-26 LAB — LACTATE DEHYDROGENASE: LDH: 406 U/L — ABNORMAL HIGH (ref 98–192)

## 2020-05-26 LAB — CBC
HCT: 37.5 % (ref 36.0–46.0)
Hemoglobin: 12.8 g/dL (ref 12.0–15.0)
MCH: 29.8 pg (ref 26.0–34.0)
MCHC: 34.1 g/dL (ref 30.0–36.0)
MCV: 87.2 fL (ref 80.0–100.0)
Platelets: 335 10*3/uL (ref 150–400)
RBC: 4.3 MIL/uL (ref 3.87–5.11)
RDW: 14.4 % (ref 11.5–15.5)
WBC: 21.1 10*3/uL — ABNORMAL HIGH (ref 4.0–10.5)
nRBC: 0.1 % (ref 0.0–0.2)

## 2020-05-26 LAB — FERRITIN: Ferritin: 127 ng/mL (ref 11–307)

## 2020-05-26 LAB — MAGNESIUM: Magnesium: 2.2 mg/dL (ref 1.7–2.4)

## 2020-05-26 MED ORDER — ENOXAPARIN SODIUM 150 MG/ML ~~LOC~~ SOLN
135.0000 mg | Freq: Two times a day (BID) | SUBCUTANEOUS | Status: DC
  Administered 2020-05-26 – 2020-05-28 (×6): 135 mg via SUBCUTANEOUS
  Filled 2020-05-26 (×7): qty 0.9

## 2020-05-26 MED ORDER — DOXYCYCLINE HYCLATE 100 MG PO TABS
100.0000 mg | ORAL_TABLET | Freq: Two times a day (BID) | ORAL | Status: AC
  Administered 2020-05-26 – 2020-05-29 (×7): 100 mg via ORAL
  Filled 2020-05-26 (×6): qty 1

## 2020-05-26 MED ORDER — MENTHOL 3 MG MT LOZG
1.0000 | LOZENGE | OROMUCOSAL | Status: DC | PRN
  Filled 2020-05-26: qty 9

## 2020-05-26 NOTE — Progress Notes (Addendum)
PROGRESS NOTE    Christina Castro  JTT:[id number] DOB: 07-23-1970 DOA: 05/24/2020 PCP: Patient, No Pcp Per   Brief Narrative:  50 year old with history of GERD, depression, morbid obesity came to the ER with complaints of shortness of breath and hypoxia diagnosed with COVID-19 pneumonia. She was started on supplemental oxygen, steroids, remdesivir and baricitinib. CTA was negative for PE but showed diffuse groundglass opacities.  Lower extremity ultrasound showed chronic DVT   Assessment & Plan:   Principal Problem:   Pneumonia due to COVID-19 virus Active Problems:   Morbid obesity (HCC)   Severe sepsis (HCC)   Acute hypoxemic respiratory failure (HCC)   Diarrhea   Severe sepsis; improved Acute hypoxic respiratory failure secondary to COVID-19 pneumonia (Initially tested positive 05/16/20) -Oxygen levels- 9 L high flow -Remdesivir-D 3 -Solumedrol 40mg  q8hrs - D3 -Baricitinib- D3 -Antibiotics- doxycycline; complete 5 day course -procalcitonin- 0.8 -Chest x-ray/CTA chest- negative PE, diffuse groundglass opacity, mediastinal LN -Supportive care-antitussive, inhalers, I-S/flutter -CODE STATUS confirmed -Vitamin C & Zinc. Prone >16hrs/day.  -Routine: Labs have been reviewed including ferritin, LDH, CRP, d-dimer, fibrinogen.  Will need to trend this lab daily.  Age-indeterminate right posterior tibial vein DVT - Lovenox 1 mg/kg q12hrs  Diarrhea, COVID-related - Supportive care.  Has not had a bowel movement since being in the hospital.  If continues will rule out C. difficile  Morbid obesity BMI >50 -Weight loss and exercise  GERD - Not on home meds  Depression -Not on home meds   DVT prophylaxis: Lovenox-full dose Code Status: Full code Family Communication:  , no answer - 1/23   Status is: Inpatient  Remains inpatient appropriate because:Inpatient level of care appropriate due to severity of illness   Dispo: The patient is from: Home               Anticipated d/c is to: Home              Anticipated d/c date is: 2 days              Patient currently is not medically stable to d/c.   Difficult to place patient No  Body mass index is 50.96 kg/m.     Subjective: Feels littler better, sitting up in the chair.   Review of Systems Otherwise negative except as per HPI, including: General = no fevers, chills, dizziness,  fatigue HEENT/EYES = negative for loss of vision, double vision, blurred vision,  sore throa Cardiovascular= negative for chest pain, palpitation Respiratory/lungs= negative for shortness of breath, cough, wheezing; hemoptysis,  Gastrointestinal= negative for nausea, vomiting, abdominal pain Genitourinary= negative for Dysuria MSK = Negative for arthralgia, myalgias Neurology= Negative for headache, numbness, tingling  Psychiatry= Negative for suicidal and homocidal ideation Skin= Negative for Rash   Examination:  Constitutional: Not in acute distress; 9 HHFL Respiratory: b/l rhonchi Cardiovascular: Normal sinus rhythm, no rubs Abdomen: Nontender nondistended good bowel sounds Musculoskeletal: No edema noted Skin: No rashes seen Neurologic: CN 2-12 grossly intact.  And nonfocal Psychiatric: Normal judgment and insight. Alert and oriented x 3. Normal mood.     Objective: Vitals:   05/25/20 1857 05/25/20 2009 05/25/20 2350 05/26/20 0348  BP:  126/76 134/85 129/79  Pulse:  100 100 99  Resp:  (!) 21 18 (!) 24  Temp:  97.9 F (36.6 C) 98.3 F (36.8 C) 98.7 F (37.1 C)  TempSrc:  Oral Oral Oral  SpO2: 95% 94% 100% 98%  Weight:      Height:  Intake/Output Summary (Last 24 hours) at 05/26/2020 0816 Last data filed at 05/25/2020 2000 Gross per 24 hour  Intake 590 ml  Output --  Net 590 ml   Filed Weights   05/24/20 1637 05/24/20 2256  Weight: 127 kg (!) 138.9 kg     Data Reviewed:   CBC: Recent Labs  Lab 05/24/20 1714 05/25/20 0448 05/26/20 0223  WBC 17.4* 13.9* 21.1*  NEUTROABS  14.8* 11.8*  --   HGB 13.9 13.2 12.8  HCT 42.4 38.0 37.5  MCV 90.0 88.0 87.2  PLT 322 280 335   Basic Metabolic Panel: Recent Labs  Lab 05/24/20 1714 05/25/20 0448 05/26/20 0223  NA 134* 141 138  K 4.2 4.8 4.3  CL 104 111 107  CO2 18* 16* 21*  GLUCOSE 148* 148* 138*  BUN 6 7 13   CREATININE 0.74 0.73 0.84  CALCIUM 8.2* 7.9* 8.4*  MG  --   --  2.2   GFR: Estimated Creatinine Clearance: 112.3 mL/min (by C-G formula based on SCr of 0.84 mg/dL). Liver Function Tests: Recent Labs  Lab 05/24/20 1714 05/25/20 0448  AST 42* 39  ALT 21 17  ALKPHOS 67 68  BILITOT 0.6 0.4  PROT 6.6 6.2*  ALBUMIN 2.4* 2.3*   No results for input(s): LIPASE, AMYLASE in the last 168 hours. No results for input(s): AMMONIA in the last 168 hours. Coagulation Profile: No results for input(s): INR, PROTIME in the last 168 hours. Cardiac Enzymes: No results for input(s): CKTOTAL, CKMB, CKMBINDEX, TROPONINI in the last 168 hours. BNP (last 3 results) No results for input(s): PROBNP in the last 8760 hours. HbA1C: No results for input(s): HGBA1C in the last 72 hours. CBG: No results for input(s): GLUCAP in the last 168 hours. Lipid Profile: Recent Labs    05/24/20 1714  TRIG 104   Thyroid Function Tests: No results for input(s): TSH, T4TOTAL, FREET4, T3FREE, THYROIDAB in the last 72 hours. Anemia Panel: Recent Labs    05/24/20 1714 05/26/20 0223  FERRITIN 147 127   Sepsis Labs: Recent Labs  Lab 05/24/20 1714 05/24/20 1858 05/24/20 2221  PROCALCITON 0.85  --   --   LATICACIDVEN 2.4* 2.7* 2.5*    Recent Results (from the past 240 hour(s))  SARS CORONAVIRUS 2 (TAT 6-24 HRS) Nasopharyngeal Nasopharyngeal Swab     Status: Abnormal   Collection Time: 05/16/20 11:17 AM   Specimen: Nasopharyngeal Swab  Result Value Ref Range Status   SARS Coronavirus 2 POSITIVE (A) NEGATIVE Final    Comment: (NOTE) SARS-CoV-2 target nucleic acids are DETECTED.  The SARS-CoV-2 RNA is generally  detectable in upper and lower respiratory specimens during the acute phase of infection. Positive results are indicative of the presence of SARS-CoV-2 RNA. Clinical correlation with patient history and other diagnostic information is  necessary to determine patient infection status. Positive results do not rule out bacterial infection or co-infection with other viruses.  The expected result is Negative.  Fact Sheet for Patients: HairSlick.nohttps://www.fda.gov/media/138098/download  Fact Sheet for Healthcare Providers: quierodirigir.comhttps://www.fda.gov/media/138095/download  This test is not yet approved or cleared by the [place] FDA and  has been authorized for detection and/or diagnosis of SARS-CoV-2 by FDA under an Emergency Use Authorization (EUA). This EUA will remain  in effect (meaning this test can be used) for the duration of the COVID-19 declaration under Section 564(b)(1) of the Act, 21 U. S.C. section 360bbb-3(b)(1), unless the authorization is terminated or revoked sooner.   Performed at [facility], [address] [place], [place] [address]   Blood Culture (routine x 2)     Status: None (Preliminary result)   Collection Time: 05/24/20  5:14 PM   Specimen: BLOOD  Result Value Ref Range Status   Specimen Description BLOOD LEFT ANTECUBITAL  Final   Special Requests   Final    BOTTLES DRAWN AEROBIC AND ANAEROBIC Blood Culture results may not be optimal due to an inadequate volume of blood received in culture bottles   Culture   Final    NO GROWTH < 24 HOURS Performed at [facility], [address] [place], [place] [address]    Report Status PENDING  Incomplete  Blood Culture (routine x 2)     Status: None (Preliminary result)   Collection Time: 05/24/20  5:15 PM   Specimen: BLOOD RIGHT HAND  Result Value Ref Range Status   Specimen Description BLOOD RIGHT HAND  Final   Special Requests   Final    BOTTLES DRAWN AEROBIC AND ANAEROBIC Blood Culture adequate volume    Culture   Final    NO GROWTH < 24 HOURS Performed at [facility], [address] [place], [place] [address]    Report Status PENDING  Incomplete  Resp panel by RT-PCR (RSV, Flu A&B, Covid)     Status: Abnormal   Collection Time: 05/25/20  4:17 AM  Result Value Ref Range Status   SARS Coronavirus 2 by RT PCR POSITIVE (A) NEGATIVE Final    Comment: RESULT CALLED TO, READ BACK BY AND VERIFIED WITH: J TOBIAS RN 05/25/20 0545 JDW (NOTE) SARS-CoV-2 target nucleic acids are DETECTED.  The SARS-CoV-2 RNA is generally detectable in upper respiratory specimens during the acute phase of infection. Positive results are indicative of the presence of the identified virus, but do not rule out bacterial infection or co-infection with other pathogens not detected by the test. Clinical correlation with patient history and other diagnostic information is necessary to determine patient infection status. The expected result is Negative.  Fact Sheet for Patients: BloggerCourse.com  Fact Sheet for Healthcare Providers: SeriousBroker.it  This test is not yet approved or cleared by the [place] FDA and  has been authorized for detection and/or diagnosis of SARS-CoV-2 by FDA under an Emergency Use Authorization (EUA).  This EUA will remain in effect (meaning this test can be used ) for the duration of  the COVID-19 declaration under Section 564(b)(1) of the Act, 21 U.S.C. section 360bbb-3(b)(1), unless the authorization is terminated or revoked sooner.     Influenza A by PCR NEGATIVE NEGATIVE Final   Influenza B by PCR NEGATIVE NEGATIVE Final    Comment: (NOTE) The Xpert Xpress SARS-CoV-2/FLU/RSV plus assay is intended as an aid in the diagnosis of influenza from Nasopharyngeal swab specimens and should not be used as a sole basis for treatment. Nasal washings and aspirates are unacceptable for Xpert Xpress  SARS-CoV-2/FLU/RSV testing.  Fact Sheet for Patients: BloggerCourse.com  Fact Sheet for Healthcare Providers: SeriousBroker.it  This test is not yet approved or cleared by the [place] FDA and has been authorized for detection and/or diagnosis of SARS-CoV-2 by FDA under an Emergency Use Authorization (EUA). This EUA will remain in effect (meaning this test can be used) for the duration of the COVID-19 declaration under Section 564(b)(1) of the Act, 21 U.S.C. section 360bbb-3(b)(1), unless the authorization is terminated or revoked.     Resp Syncytial Virus by PCR NEGATIVE NEGATIVE Final    Comment: (NOTE) Fact Sheet  for Patients: BloggerCourse.com  Fact Sheet for Healthcare Providers: SeriousBroker.it  This test is not yet approved or cleared by the [place] FDA and has been authorized for detection and/or diagnosis of SARS-CoV-2 by FDA under an Emergency Use Authorization (EUA). This EUA will remain in effect (meaning this test can be used) for the duration of the COVID-19 declaration under Section 564(b)(1) of the Act, 21 U.S.C. section 360bbb-3(b)(1), unless the authorization is terminated or revoked.  Performed at [facility], [address] [place], [place] [address]          Radiology Studies: CT Angio Chest PE W/Cm &/Or Wo Cm  Result Date: 05/24/2020 CLINICAL DATA:  Shortness of breath, COVID-19 positivity EXAM: CT ANGIOGRAPHY CHEST WITH CONTRAST TECHNIQUE: Multidetector CT imaging of the chest was performed using the standard protocol during bolus administration of intravenous contrast. Multiplanar CT image reconstructions and MIPs were obtained to evaluate the vascular anatomy. CONTRAST:  75mL OMNIPAQUE IOHEXOL 350 MG/ML SOLN COMPARISON:  Chest x-ray from earlier in the same day. FINDINGS: Cardiovascular: Atherosclerotic calcifications of the  thoracic aorta are noted. No aneurysmal dilatation or dissection is seen. No cardiac enlargement is noted. No coronary calcifications are seen. The pulmonary artery is well visualized within normal branching pattern. No filling defect to suggest pulmonary embolism is noted. Mediastinum/Nodes: Thoracic inlet is within normal limits. Lymphadenopathy is noted in the right paratracheal region measuring 2.2 cm in short axis. Scattered smaller mediastinal nodes are seen. Small hilar nodes are noted as well. These are likely reactive in nature given the changes in the lungs. Lungs/Pleura: Lungs are well aerated bilaterally. Bilateral ground-glass airspace opacity is noted worst in the lung bases consistent with the given clinical history of COVID-19 positivity. This is similar to that seen on the prior chest x-ray. Upper Abdomen: Changes of prior gastric bypass. No other upper abdominal abnormality is noted. Musculoskeletal: Degenerative changes of the thoracic spine are noted. Review of the MIP images confirms the above findings. IMPRESSION: No evidence of pulmonary emboli. Diffuse ground-glass opacities bilaterally consistent with the given clinical history of COVID-19 positivity. Mediastinal lymphadenopathy likely reactive in nature to the changes within the lungs. Electronically Signed   By: Alcide Clever M.D.   On: 05/24/2020 18:54   DG Chest Port 1 View  Result Date: 05/24/2020 CLINICAL DATA:  Shortness of breath, COVID EXAM: PORTABLE CHEST 1 VIEW COMPARISON:  None. FINDINGS: Fairly widespread bilateral ground-glass opacities and patchy consolidations in the mid to lower lungs. No pleural effusion. Normal cardiomediastinal silhouette. No pneumothorax. IMPRESSION: Fairly widespread bilateral ground-glass opacities and patchy consolidations in the mid to lower lungs, consistent with multifocal pneumonia. Electronically Signed   By: Jasmine Pang M.D.   On: 05/24/2020 17:34   VAS [place] LOWER EXTREMITY VENOUS  (DVT)  Result Date: 05/25/2020  Lower Venous DVT Study Indications: Covid, elevated d-dimer.  Anticoagulation: Lovenox. Limitations: Body habitus and poor ultrasound/tissue interface. Comparison Study: No prior studies. Performing Technologist: Jean Rosenthal RDMS  Examination Guidelines: A complete evaluation includes B-mode imaging, spectral Doppler, color Doppler, and power Doppler as needed of all accessible portions of each vessel. Bilateral testing is considered an integral part of a complete examination. Limited examinations for reoccurring indications may be performed as noted. The reflux portion of the exam is performed with the patient in reverse Trendelenburg.  +---------+---------------+---------+-----------+----------+-----------------+ RIGHT    CompressibilityPhasicitySpontaneityPropertiesThrombus Aging    +---------+---------------+---------+-----------+----------+-----------------+ CFV      Full           Yes  Yes                                    +---------+---------------+---------+-----------+----------+-----------------+ SFJ      Full                                                           +---------+---------------+---------+-----------+----------+-----------------+ FV Prox  Full                                                           +---------+---------------+---------+-----------+----------+-----------------+ FV Mid   Full                                                           +---------+---------------+---------+-----------+----------+-----------------+ FV DistalFull                                                           +---------+---------------+---------+-----------+----------+-----------------+ PFV      Full                                                           +---------+---------------+---------+-----------+----------+-----------------+ POP      Full           Yes      Yes                                     +---------+---------------+---------+-----------+----------+-----------------+ PTV      Partial        Yes      Yes                  Age Indeterminate +---------+---------------+---------+-----------+----------+-----------------+ PERO     Full                                                           +---------+---------------+---------+-----------+----------+-----------------+   +---------+---------------+---------+-----------+----------+--------------+ LEFT     CompressibilityPhasicitySpontaneityPropertiesThrombus Aging +---------+---------------+---------+-----------+----------+--------------+ CFV      Full           Yes      Yes                                 +---------+---------------+---------+-----------+----------+--------------+ SFJ      Full                                                        +---------+---------------+---------+-----------+----------+--------------+  FV Prox  Full                                                        +---------+---------------+---------+-----------+----------+--------------+ FV Mid   Full                                                        +---------+---------------+---------+-----------+----------+--------------+ FV DistalFull                                                        +---------+---------------+---------+-----------+----------+--------------+ PFV      Full                                                        +---------+---------------+---------+-----------+----------+--------------+ POP      Full           Yes      Yes                                 +---------+---------------+---------+-----------+----------+--------------+ PTV      Full                                                        +---------+---------------+---------+-----------+----------+--------------+ PERO     Full                                                         +---------+---------------+---------+-----------+----------+--------------+     Summary: RIGHT: - Findings consistent with age indeterminate deep vein thrombosis involving the right posterior tibial veins. - No cystic structure found in the popliteal fossa.  LEFT: - There is no evidence of deep vein thrombosis in the lower extremity.  - No cystic structure found in the popliteal fossa.  *See table(s) above for measurements and observations. Electronically signed by Waverly Ferrarihristopher Dickson MD on 05/25/2020 at 3:33:15 PM.    Final         Scheduled Meds: . vitamin C  500 mg Oral Daily  . baricitinib  4 mg Oral Daily  . cholecalciferol  1,000 Units Oral Daily  . enoxaparin (LOVENOX) injection  1 mg/kg Subcutaneous Q12H  . influenza vac split quadrivalent PF  0.5 mL Intramuscular Tomorrow-1000  . Ipratropium-Albuterol  1 puff Inhalation Q6H  . methylPREDNISolone (SOLU-MEDROL) injection  40 mg Intravenous Q8H  . zinc sulfate  220 mg Oral Daily   Continuous Infusions: . doxycycline (VIBRAMYCIN) IV 100 mg (05/25/20 2237)  .  remdesivir 100 mg in NS 100 mL 100 mg (05/25/20 0918)     LOS: 2 days   Time spent= 35 mins    Avneet Ashmore Joline Maxcy, MD [facility]  If 7PM-7AM, please contact night-coverage  05/26/2020, 8:16 AM

## 2020-05-27 LAB — CBC
HCT: 41.6 % (ref 36.0–46.0)
Hemoglobin: 13.5 g/dL (ref 12.0–15.0)
MCH: 28.8 pg (ref 26.0–34.0)
MCHC: 32.5 g/dL (ref 30.0–36.0)
MCV: 88.7 fL (ref 80.0–100.0)
Platelets: 363 10*3/uL (ref 150–400)
RBC: 4.69 MIL/uL (ref 3.87–5.11)
RDW: 14.5 % (ref 11.5–15.5)
WBC: 23.2 10*3/uL — ABNORMAL HIGH (ref 4.0–10.5)
nRBC: 0 % (ref 0.0–0.2)

## 2020-05-27 LAB — BASIC METABOLIC PANEL
Anion gap: 10 (ref 5–15)
BUN: 16 mg/dL (ref 6–20)
CO2: 21 mmol/L — ABNORMAL LOW (ref 22–32)
Calcium: 8.6 mg/dL — ABNORMAL LOW (ref 8.9–10.3)
Chloride: 108 mmol/L (ref 98–111)
Creatinine, Ser: 0.81 mg/dL (ref 0.44–1.00)
GFR, Estimated: >60 mL/min (ref >60)
Glucose, Bld: 152 mg/dL — ABNORMAL HIGH (ref 70–99)
Potassium: 4.6 mmol/L (ref 3.5–5.1)
Sodium: 139 mmol/L (ref 135–145)

## 2020-05-27 LAB — LACTATE DEHYDROGENASE: LDH: 367 U/L — ABNORMAL HIGH (ref 98–192)

## 2020-05-27 LAB — FERRITIN: Ferritin: 116 ng/mL (ref 11–307)

## 2020-05-27 LAB — C-REACTIVE PROTEIN: CRP: 4 mg/dL — ABNORMAL HIGH (ref <1.0)

## 2020-05-27 LAB — MAGNESIUM: Magnesium: 2.1 mg/dL (ref 1.7–2.4)

## 2020-05-27 NOTE — Progress Notes (Signed)
PROGRESS NOTE    Jacalyn LefevreMary Scotti  ZOX:[id number] DOB: 07/18/1970 DOA: 05/24/2020 PCP: Patient, No Pcp Per   Brief Narrative:  50 year old with history of GERD, depression, morbid obesity came to the ER with complaints of shortness of breath and hypoxia diagnosed with COVID-19 pneumonia. She was started on supplemental oxygen, steroids, remdesivir and baricitinib. CTA was negative for PE but showed diffuse groundglass opacities.  Lower extremity ultrasound showed chronic DVT   Assessment & Plan:   Principal Problem:   Pneumonia due to COVID-19 virus Active Problems:   Morbid obesity (HCC)   Severe sepsis (HCC)   Acute hypoxemic respiratory failure (HCC)   Diarrhea   Severe sepsis; improved Acute hypoxic respiratory failure secondary to COVID-19 pneumonia (Initially tested positive 05/16/20) -Oxygen levels- 7 L high flow -Remdesivir-D 4 -Solumedrol 40mg  q8hrs - D4 -Baricitinib- D4 -Antibiotics- doxycycline; complete 5 day course -procalcitonin- 0.8 -Chest x-ray/CTA chest- negative PE, diffuse groundglass opacity, mediastinal LN -Supportive care-antitussive, inhalers, I-S/flutter -CODE STATUS confirmed -Vitamin C & Zinc. Prone >16hrs/day.  -Routine: Labs have been reviewed including ferritin, LDH, CRP, d-dimer, fibrinogen.  Will need to trend this lab daily.  Age-indeterminate right posterior tibial vein DVT - Lovenox 1 mg/kg q12hrs  Diarrhea, COVID-related - Supportive care.  Has not had a bowel movement since being in the hospital.  If continues will rule out C. difficile  Morbid obesity BMI >50 -Weight loss and exercise  GERD - Not on home meds  Depression -Not on home meds   DVT prophylaxis: Lovenox-full dose Code Status: Full code Family Communication:  Rica RecordsCalled Juan and her daughter while I was in the room, no answer.   Status is: Inpatient  Remains inpatient appropriate because:Inpatient level of care appropriate due to severity of illness   Dispo: The  patient is from: Home              Anticipated d/c is to: Home              Anticipated d/c date is: 2 days              Patient currently is not medically stable to d/c.   Difficult to place patient No  Body mass index is 50.96 kg/m.     Subjective: Sitting up in the chair feeling better.  On 7 L high flow.  Coughing quite a bit after using incentive spirometer and flutter valve  Review of Systems Otherwise negative except as per HPI, including: General: Denies fever, chills, night sweats or unintended weight loss. Resp: Denies cough, wheezing, shortness of breath. Cardiac: Denies chest pain, palpitations, orthopnea, paroxysmal nocturnal dyspnea. GI: Denies abdominal pain, nausea, vomiting, diarrhea or constipation GU: Denies dysuria, frequency, hesitancy or incontinence MS: Denies muscle aches, joint pain or swelling Neuro: Denies headache, neurologic deficits (focal weakness, numbness, tingling), abnormal gait Psych: Denies anxiety, depression, SI/HI/AVH Skin: Denies new rashes or lesions ID: Denies sick contacts, exotic exposures, travel   Examination: Constitutional: Not in acute distress, on 7 L high flow Respiratory:b/l Rhonchi Cardiovascular: Normal sinus rhythm, no rubs Abdomen: Nontender nondistended good bowel sounds Musculoskeletal: No edema noted Skin: No rashes seen Neurologic: CN 2-12 grossly intact.  And nonfocal Psychiatric: Normal judgment and insight. Alert and oriented x 3. Normal mood.    Objective: Vitals:   05/26/20 2006 05/26/20 2346 05/27/20 0341 05/27/20 0700  BP: 121/76 121/60 123/78 122/84  Pulse: (!) 109 96 76 93  Resp: 20 (!) 24 19 20   Temp: 98.1 F (36.7 C) 98.1 F (36.7  C) 98.1 F (36.7 C) 98.4 F (36.9 C)  TempSrc: Oral Oral Oral Oral  SpO2: 98% 96% 100% 96%  Weight:      Height:        Intake/Output Summary (Last 24 hours) at 05/27/2020 1134 Last data filed at 05/27/2020 0902 Gross per 24 hour  Intake 360 ml  Output 300 ml   Net 60 ml   Filed Weights   05/24/20 1637 05/24/20 2256  Weight: 127 kg (!) 138.9 kg     Data Reviewed:   CBC: Recent Labs  Lab 05/24/20 1714 05/25/20 0448 05/26/20 0223 05/27/20 0031  WBC 17.4* 13.9* 21.1* 23.2*  NEUTROABS 14.8* 11.8*  --   --   HGB 13.9 13.2 12.8 13.5  HCT 42.4 38.0 37.5 41.6  MCV 90.0 88.0 87.2 88.7  PLT 322 280 335 363   Basic Metabolic Panel: Recent Labs  Lab 05/24/20 1714 05/25/20 0448 05/26/20 0223 05/27/20 0031  NA 134* 141 138 139  K 4.2 4.8 4.3 4.6  CL 104 111 107 108  CO2 18* 16* 21* 21*  GLUCOSE 148* 148* 138* 152*  BUN 6 7 13 16   CREATININE 0.74 0.73 0.84 0.81  CALCIUM 8.2* 7.9* 8.4* 8.6*  MG  --   --  2.2 2.1   GFR: Estimated Creatinine Clearance: 116.5 mL/min (by C-G formula based on SCr of 0.81 mg/dL). Liver Function Tests: Recent Labs  Lab 05/24/20 1714 05/25/20 0448  AST 42* 39  ALT 21 17  ALKPHOS 67 68  BILITOT 0.6 0.4  PROT 6.6 6.2*  ALBUMIN 2.4* 2.3*   No results for input(s): LIPASE, AMYLASE in the last 168 hours. No results for input(s): AMMONIA in the last 168 hours. Coagulation Profile: No results for input(s): INR, PROTIME in the last 168 hours. Cardiac Enzymes: No results for input(s): CKTOTAL, CKMB, CKMBINDEX, TROPONINI in the last 168 hours. BNP (last 3 results) No results for input(s): PROBNP in the last 8760 hours. HbA1C: No results for input(s): HGBA1C in the last 72 hours. CBG: No results for input(s): GLUCAP in the last 168 hours. Lipid Profile: Recent Labs    05/24/20 1714  TRIG 104   Thyroid Function Tests: No results for input(s): TSH, T4TOTAL, FREET4, T3FREE, THYROIDAB in the last 72 hours. Anemia Panel: Recent Labs    05/26/20 0223 05/27/20 0031  FERRITIN 127 116   Sepsis Labs: Recent Labs  Lab 05/24/20 1714 05/24/20 1858 05/24/20 2221  PROCALCITON 0.85  --   --   LATICACIDVEN 2.4* 2.7* 2.5*    Recent Results (from the past 240 hour(s))  Blood Culture (routine x 2)      Status: None (Preliminary result)   Collection Time: 05/24/20  5:14 PM   Specimen: BLOOD  Result Value Ref Range Status   Specimen Description BLOOD LEFT ANTECUBITAL  Final   Special Requests   Final    BOTTLES DRAWN AEROBIC AND ANAEROBIC Blood Culture results may not be optimal due to an inadequate volume of blood received in culture bottles   Culture   Final    NO GROWTH 2 DAYS Performed at [facility], [address], [place], [place]    Report Status PENDING  Incomplete  Blood Culture (routine x 2)     Status: None (Preliminary result)   Collection Time: 05/24/20  5:15 PM   Specimen: BLOOD RIGHT HAND  Result Value Ref Range Status   Specimen Description BLOOD RIGHT HAND  Final   Special Requests  Final    BOTTLES DRAWN AEROBIC AND ANAEROBIC Blood Culture adequate volume   Culture   Final    NO GROWTH 2 DAYS Performed at [facility], [address], [place], [place] [address]    Report Status PENDING  Incomplete  Resp panel by RT-PCR (RSV, Flu A&B, Covid)     Status: Abnormal   Collection Time: 05/25/20  4:17 AM  Result Value Ref Range Status   SARS Coronavirus 2 by RT PCR POSITIVE (A) NEGATIVE Final    Comment: RESULT CALLED TO, READ BACK BY AND VERIFIED WITH: J TOBIAS RN 05/25/20 0545 JDW (NOTE) SARS-CoV-2 target nucleic acids are DETECTED.  The SARS-CoV-2 RNA is generally detectable in upper respiratory specimens during the acute phase of infection. Positive results are indicative of the presence of the identified virus, but do not rule out bacterial infection or co-infection with other pathogens not detected by the test. Clinical correlation with patient history and other diagnostic information is necessary to determine patient infection status. The expected result is Negative.  Fact Sheet for Patients: BloggerCourse.com  Fact Sheet for Healthcare Providers: SeriousBroker.it  This test is  not yet approved or cleared by the [place] FDA and  has been authorized for detection and/or diagnosis of SARS-CoV-2 by FDA under an Emergency Use Authorization (EUA).  This EUA will remain in effect (meaning this test can be used ) for the duration of  the COVID-19 declaration under Section 564(b)(1) of the Act, 21 U.S.C. section 360bbb-3(b)(1), unless the authorization is terminated or revoked sooner.     Influenza A by PCR NEGATIVE NEGATIVE Final   Influenza B by PCR NEGATIVE NEGATIVE Final    Comment: (NOTE) The Xpert Xpress SARS-CoV-2/FLU/RSV plus assay is intended as an aid in the diagnosis of influenza from Nasopharyngeal swab specimens and should not be used as a sole basis for treatment. Nasal washings and aspirates are unacceptable for Xpert Xpress SARS-CoV-2/FLU/RSV testing.  Fact Sheet for Patients: BloggerCourse.com  Fact Sheet for Healthcare Providers: SeriousBroker.it  This test is not yet approved or cleared by the [place] FDA and has been authorized for detection and/or diagnosis of SARS-CoV-2 by FDA under an Emergency Use Authorization (EUA). This EUA will remain in effect (meaning this test can be used) for the duration of the COVID-19 declaration under Section 564(b)(1) of the Act, 21 U.S.C. section 360bbb-3(b)(1), unless the authorization is terminated or revoked.     Resp Syncytial Virus by PCR NEGATIVE NEGATIVE Final    Comment: (NOTE) Fact Sheet for Patients: BloggerCourse.com  Fact Sheet for Healthcare Providers: SeriousBroker.it  This test is not yet approved or cleared by the [place] FDA and has been authorized for detection and/or diagnosis of SARS-CoV-2 by FDA under an Emergency Use Authorization (EUA). This EUA will remain in effect (meaning this test can be used) for the duration of the COVID-19 declaration under Section  564(b)(1) of the Act, 21 U.S.C. section 360bbb-3(b)(1), unless the authorization is terminated or revoked.  Performed at [facility], [address], [place], [place] [address]          Radiology Studies: No results found.      Scheduled Meds: . vitamin C  500 mg Oral Daily  . baricitinib  4 mg Oral Daily  . cholecalciferol  1,000 Units Oral Daily  . doxycycline  100 mg Oral Q12H  . enoxaparin (LOVENOX) injection  135 mg Subcutaneous Q12H  . influenza vac split quadrivalent PF  0.5 mL  Intramuscular Tomorrow-1000  . Ipratropium-Albuterol  1 puff Inhalation Q6H  . methylPREDNISolone (SOLU-MEDROL) injection  40 mg Intravenous Q8H  . zinc sulfate  220 mg Oral Daily   Continuous Infusions: . remdesivir 100 mg in NS 100 mL 100 mg (05/27/20 0941)     LOS: 3 days   Time spent= 35 mins    Thanh Pomerleau Joline Maxcy, MD [facility]  If 7PM-7AM, please contact night-coverage  05/27/2020, 11:34 AM

## 2020-05-27 NOTE — Plan of Care (Signed)
  Problem: Education: Goal: Knowledge of risk factors and measures for prevention of condition will improve Outcome: Progressing   Problem: Coping: Goal: Psychosocial and spiritual needs will be supported Outcome: Progressing   Problem: Respiratory: Goal: Will maintain a patent airway Outcome: Progressing Goal: Complications related to the disease process, condition or treatment will be avoided or minimized Outcome: Progressing   

## 2020-05-27 NOTE — Progress Notes (Signed)
Occupational Therapy Evaluation Patient Details Name: Christina Castro MRN: [id number] DOB: 10-03-1970 Today's Date: 05/27/2020    History of Present Illness 50 y.o. female admitted on 05/24/20 for SOB, dx with COVID 19 PNA, sepsis, acute hypoxic respiratory failure (initial COVID + was 05/16/20), negative PE, + R LE DVT, diarrhea.  Pt with significant PMH of obesity, HA, depression, anemia, gastric bypass, multiple hernia repairs.   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 On entry pt on 7L HFNC with 1 extension with SpO2 @ 91. Extension removed and probe switched to earlobe. SpO2 97. Pt completed BSC transfer and hygiene with SpO2 maintaining in 90s on 6L. Educated pt on pursed lip breathing and anxiety management during mobility and ADL tasks. Ambulated @ 100 ft with 3 rest breaks on 6L with SpO2 remaining above 90 with good control of breath pattern. Pt completed flutter valve and incentive spirometer exercises - able to pull 1000 ml. Educated on importance of spending time OOB and proning. Pt verbalized understanding. Left on 5L with SpO2 95. Pt very appreciative. Will follow acutey however do not anticipate need for follow up OT after DC. Encourage pt to ambulate with staff.     Follow Up Recommendations  No OT follow up;Supervision - Intermittent    Equipment Recommendations  3 in 1 bedside commode    Recommendations for Other Services       Precautions / Restrictions Precautions Precautions: Other (comment) Precaution Comments: monitor O2 sats      Mobility Bed Mobility               General bed mobility comments: OOB in chair    Transfers Overall transfer level: Needs assistance Equipment used: None Transfers: Sit to/from Stand;Stand Pivot Transfers Sit to Stand: Supervision Stand pivot transfers: Supervision       General transfer comment: supervision for safety and line  management.    Balance Overall balance assessment: Mild deficits observed, not formally tested                                         ADL either performed or assessed with clinical judgement   ADL Overall ADL's : Needs assistance/impaired     Grooming: Set up;Sitting   Upper Body Bathing: Set up;Sitting   Lower Body Bathing: Minimal assistance;Sit to/from stand   Upper Body Dressing : Set up;Sitting   Lower Body Dressing: Minimal assistance;Sit to/from stand   Toilet Transfer: Sales promotion account executive Details (indicate cue type and reason): S duet o lines; Able to complete safely @ mod I leve once positioned correctly Toileting- Clothing Manipulation and Hygiene: Set up       Functional mobility during ADLs: Min guard General ADL Comments: Began educaiton on use of pursed lip breathign during funcitonal tasks. Good return demonstration     Vision         Perception     Praxis      Pertinent Vitals/Pain Pain Assessment: No/denies pain     Hand Dominance Right   Extremity/Trunk Assessment Upper Extremity Assessment Upper Extremity Assessment: Overall WFL for tasks assessed   Lower Extremity Assessment Lower Extremity Assessment: Defer to PT evaluation   Cervical / Trunk Assessment Cervical / Trunk Assessment: Normal   Communication Communication Communication: No difficulties   Cognition Arousal/Alertness: Awake/alert Behavior During Therapy: [facility]  for tasks assessed/performed Overall Cognitive Status: Within Functional Limits for tasks assessed                                     General Comments       Exercises Exercises: Other exercises Other Exercises Other Exercises: incentive spirometer x 5 - able to pull @ . VC on correct use; completed flutter valve x 5; cues ot complete pursed lip breathign in between exercises to help control breathing pattern. Other Exercises: educated on importance of  proning - pt verbazlied understanding Other Exercises: Issued theraband to begin HEP   Shoulder Instructions      Home Living Family/patient expects to be discharged to:: Private residence Living Arrangements: Spouse/significant other Available Help at Discharge: Family Type of Home: House Home Access: Stairs to enter           Bathroom Shower/Tub: Chief Strategy Officer: Standard Bathroom Accessibility: Yes   Home Equipment: None          Prior Functioning/Environment Level of Independence: Independent        Comments: completely independent, works from home (talking on the phone-this is when she knew she was in trouble as she could not talk at work without getting SOB).        OT Problem List: Decreased strength;Decreased activity tolerance;Impaired balance (sitting and/or standing);Decreased safety awareness;Decreased knowledge of use of DME or AE;Cardiopulmonary status limiting activity;Obesity      OT Treatment/Interventions: Self-care/ADL training;Therapeutic exercise;Neuromuscular education;Energy conservation;DME and/or AE instruction;Therapeutic activities;Patient/family education    OT Goals(Current goals can be found in the care plan section) Acute Rehab OT Goals Patient Stated Goal: to get her breathing better so she can go home. OT Goal Formulation: With patient Time For Goal Achievement: 06/10/20 Potential to Achieve Goals: Good  OT Frequency: Min 2X/week   Barriers to D/C:            Co-evaluation              AM-PAC OT "6 Clicks" Daily Activity     Outcome Measure Help from another person eating meals?: None Help from another person taking care of personal grooming?: A Little Help from another person toileting, which includes using toliet, bedpan, or urinal?: A Little Help from another person bathing (including washing, rinsing, drying)?: A Little Help from another person to put on and taking off regular upper body  clothing?: A Little Help from another person to put on and taking off regular lower body clothing?: A Little 6 Click Score: 19   End of Session Equipment Utilized During Treatment: Oxygen (6L) Nurse Communication: Mobility status  Activity Tolerance: Patient tolerated treatment well Patient left: in chair;with call bell/phone within reach  OT Visit Diagnosis: Unsteadiness on feet (R26.81);Other abnormalities of gait and mobility (R26.89);Muscle weakness (generalized) (M62.81)                Time: 1194-1740 OT Time Calculation (min): 45 min Charges:  OT General Charges $OT Visit: 1 Visit OT Evaluation $OT Eval Moderate Complexity: 1 Mod OT Treatments $Self Care/Home Management : 23-37 mins  Luisa Dago, OT/L   Acute OT Clinical Specialist Acute Rehabilitation Services Pager [phone] Office [phone]   [facility] 05/27/2020, 7:28 PM

## 2020-05-28 LAB — C-REACTIVE PROTEIN: CRP: 1.1 mg/dL — ABNORMAL HIGH (ref <1.0)

## 2020-05-28 LAB — CBC
HCT: 41.9 % (ref 36.0–46.0)
Hemoglobin: 13.8 g/dL (ref 12.0–15.0)
MCH: 29 pg (ref 26.0–34.0)
MCHC: 32.9 g/dL (ref 30.0–36.0)
MCV: 88 fL (ref 80.0–100.0)
Platelets: 444 10*3/uL — ABNORMAL HIGH (ref 150–400)
RBC: 4.76 MIL/uL (ref 3.87–5.11)
RDW: 14.6 % (ref 11.5–15.5)
WBC: 24.7 10*3/uL — ABNORMAL HIGH (ref 4.0–10.5)
nRBC: 0 % (ref 0.0–0.2)

## 2020-05-28 LAB — BASIC METABOLIC PANEL
Anion gap: 11 (ref 5–15)
BUN: 16 mg/dL (ref 6–20)
CO2: 22 mmol/L (ref 22–32)
Calcium: 8.6 mg/dL — ABNORMAL LOW (ref 8.9–10.3)
Chloride: 103 mmol/L (ref 98–111)
Creatinine, Ser: 0.74 mg/dL (ref 0.44–1.00)
GFR, Estimated: >60 mL/min (ref >60)
Glucose, Bld: 134 mg/dL — ABNORMAL HIGH (ref 70–99)
Potassium: 4.5 mmol/L (ref 3.5–5.1)
Sodium: 136 mmol/L (ref 135–145)

## 2020-05-28 LAB — FERRITIN: Ferritin: 129 ng/mL (ref 11–307)

## 2020-05-28 LAB — LACTATE DEHYDROGENASE: LDH: 313 U/L — ABNORMAL HIGH (ref 98–192)

## 2020-05-28 LAB — MAGNESIUM: Magnesium: 2.2 mg/dL (ref 1.7–2.4)

## 2020-05-28 LAB — BRAIN NATRIURETIC PEPTIDE: B Natriuretic Peptide: 25.3 pg/mL (ref 0.0–100.0)

## 2020-05-28 MED ORDER — METHYLPREDNISOLONE SODIUM SUCC 40 MG IJ SOLR
40.0000 mg | Freq: Two times a day (BID) | INTRAMUSCULAR | Status: DC
  Administered 2020-05-28 – 2020-05-29 (×2): 40 mg via INTRAVENOUS
  Filled 2020-05-28 (×2): qty 1

## 2020-05-28 NOTE — Progress Notes (Signed)
Physical Therapy Treatment Patient Details Name: Christina Castro MRN: [id number] DOB: [birth year] Today's Date: 05/28/2020    History of Present Illness 50 y.o. female admitted on 05/24/20 for SOB, dx with COVID 19 PNA, sepsis, acute hypoxic respiratory failure (initial COVID + was 05/16/20), negative PE, + R LE DVT, diarrhea.  Pt with significant PMH of obesity, HA, depression, anemia, gastric bypass, multiple hernia repairs.    PT Comments    Pt making excellent progress.  She demonstrates good stability with transfers and gait with supervision.  Ambulated 250' without AD and 2 standing rest breaks with 3 L O2 and O2 sats 90%.  Continue to progress cardiopulmonary endurance as able.    Follow Up Recommendations  No PT follow up     Equipment Recommendations  Other (comment) (possible home O2)    Recommendations for Other Services       Precautions / Restrictions Precautions Precautions: None    Mobility  Bed Mobility Overal bed mobility: Modified Independent                Transfers Overall transfer level: Independent Equipment used: None Transfers: Sit to/from Stand Sit to Stand: Independent Stand pivot transfers: Independent       General transfer comment: Had supervision during therapy but has been stand pivoting to bsc on her own; performed sit to stand from recliner and from low toilet  Ambulation/Gait Ambulation/Gait assistance: Supervision Gait Distance (Feet): 250 Feet Assistive device: None Gait Pattern/deviations: Step-through pattern Gait velocity: decreased   General Gait Details: 250' with 2 short standing rest breaks; required supervision for lines, vital monitoring and cues for pursed lip breathing   Stairs             Wheelchair Mobility    Modified Rankin (Stroke Patients Only)       Balance Overall balance assessment: Needs assistance Sitting-balance support: No upper extremity supported;Feet supported Sitting balance-Leahy  Scale: Good     Standing balance support: No upper extremity supported Standing balance-Leahy Scale: Good                              Cognition Arousal/Alertness: Awake/alert Behavior During Therapy: WFL for tasks assessed/performed Overall Cognitive Status: Within Functional Limits for tasks assessed                                        Exercises Other Exercises Other Exercises: incentive spirometer x 5 - able to pull @ . VC on correct use; completed flutter valve x 5; cues ot complete pursed lip breathign in between exercises to help control breathing pattern.    General Comments General comments (skin integrity, edema, etc.): On 3 L O2 with sats 94% rest and 90 % ambulation.  Did have DOE of 3/4 requiring 2 standing rest breaks      Pertinent Vitals/Pain Pain Assessment: No/denies pain    Home Living                      Prior Function            PT Goals (current goals can now be found in the care plan section) Acute Rehab PT Goals Patient Stated Goal: to get her breathing better so she can go home. PT Goal Formulation: With patient Time For Goal Achievement: 06/08/20 Potential to Achieve Goals: Good  Progress towards PT goals: Progressing toward goals    Frequency    Min 3X/week      PT Plan Current plan remains appropriate    Co-evaluation              AM-PAC PT "6 Clicks" Mobility   Outcome Measure  Help needed turning from your back to your side while in a flat bed without using bedrails?: None Help needed moving from lying on your back to sitting on the side of a flat bed without using bedrails?: None Help needed moving to and from a bed to a chair (including a wheelchair)?: None Help needed standing up from a chair using your arms (e.g., wheelchair or bedside chair)?: None Help needed to walk in hospital room?: A Little Help needed climbing 3-5 steps with a railing? : A Little 6 Click Score:  22    End of Session Equipment Utilized During Treatment: Oxygen Activity Tolerance: Patient tolerated treatment well Patient left: with call bell/phone within reach;in chair Nurse Communication: Mobility status PT Visit Diagnosis: Muscle weakness (generalized) (M62.81);Difficulty in walking, not elsewhere classified (R26.2)     Time: 1610-9604 PT Time Calculation (min) (ACUTE ONLY): 21 min  Charges:  $Gait Training: 8-22 mins                     Anise Salvo, PT Acute Rehab Services Pager [phone] [facility] [phone]     Rayetta Humphrey 05/28/2020, 2:59 PM

## 2020-05-28 NOTE — Progress Notes (Signed)
ANTICOAGULATION CONSULT NOTE - Initial Consult  Pharmacy Consult for lovenox Indication: COVID +, elevated d-dimer  No Known Allergies  Patient Measurements: Height: 5\' 5"  (165.1 cm) Weight: (!) 138.9 kg (306 lb 3.5 oz) IBW/kg (Calculated) : 57  Vital Signs: Temp: 97.6 F (36.4 C) (01/25 0338) Temp Source: Oral (01/25 0338) BP: 107/59 (01/25 0338) Pulse Rate: 79 (01/25 0338)  Labs: Recent Labs    05/26/20 0223 05/27/20 0031 05/28/20 0648  HGB 12.8 13.5 13.8  HCT 37.5 41.6 41.9  PLT 335 363 444*  CREATININE 0.84 0.81 0.74    Estimated Creatinine Clearance: 117.9 mL/min (by C-G formula based on SCr of 0.74 mg/dL).   Medical History: Past Medical History:  Diagnosis Date  . 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 Depression    "S/P losing my daughter d/t rejection post lung transplant"  . GERD (gastroesophageal reflux disease)   . Headache(784.0)    "related to starting my periods" (09/11/2013)   Assessment: 49 YOF presenting with SOB, COVID +, D-dimer 6.05, she is not on anticoagulation PTA.  CT chest angiogram negative for PE, doppler shows R tibial vein DVT, CBC wnl.    Goal of Therapy:  Anti-Xa level 0.6-1 units/ml 4hrs after LMWH dose given Monitor platelets by anticoagulation protocol: Yes   Plan:  Lovenox 1mg /kg (135 mg ) SQ every 12 hours Monitor renal function, s/s bleeding, D-dimer trend Follow up transition to PO anticoagulation  Papa Piercefield A. 11/11/2013, PharmD, BCPS, FNKF Clinical Pharmacist [place] Please utilize Amion for appropriate phone number to reach the unit pharmacist [facility])   05/28/2020 8:12 AM

## 2020-05-28 NOTE — Progress Notes (Signed)
PROGRESS NOTE    Lashena Signer  WUJ:[id number] DOB: [birth year] DOA: 05/24/2020 PCP: Patient, No Pcp Per   Brief Narrative:  50 year old with history of GERD, depression, morbid obesity came to the ER with complaints of shortness of breath and hypoxia diagnosed with COVID-19 pneumonia. She was started on supplemental oxygen, steroids, remdesivir and baricitinib. CTA was negative for PE but showed diffuse groundglass opacities.  Lower extremity ultrasound showed chronic DVT.  Slowly titrating her oxygen levels down.  Currently on 3 L nasal cannula.   Assessment & Plan:   Principal Problem:   Pneumonia due to COVID-19 virus Active Problems:   Morbid obesity (HCC)   Severe sepsis (HCC)   Acute hypoxemic respiratory failure (HCC)   Diarrhea   Severe sepsis; improved Acute hypoxic respiratory failure secondary to COVID-19 pneumonia (Initially tested positive 05/16/20) -Oxygen levels-3 L nasal cannula -Remdesivir-day 4 -Solumedrol 40mg  q8hrs -day 4, reduce frequency to every 12 hours -Baricitinib-day 4 -Antibiotics- doxycycline; complete 5 day course -procalcitonin- 0.8 -Chest x-ray/CTA chest- negative PE, diffuse groundglass opacity, mediastinal LN -Supportive care-antitussive, inhalers, I-S/flutter -CODE STATUS confirmed -Vitamin C & Zinc. Prone >16hrs/day.  -Routine: Labs have been reviewed including ferritin, LDH, CRP, d-dimer, fibrinogen.  Will need to trend this lab daily.  Age-indeterminate right posterior tibial vein DVT - Lovenox 1 mg/kg q12hrs  Diarrhea, COVID-related - Supportive care.  Appears to have resolved Morbid obesity BMI >50 -Weight loss and exercise  GERD - Not on home meds  Depression -Not on home meds   DVT prophylaxis: Lovenox-full dose Code Status: Full code Family Communication: Family not answering while I was in the room with the patient.  She states she will update her family  Status is: Inpatient  Remains inpatient appropriate  because:Inpatient level of care appropriate due to severity of illness   Dispo: The patient is from: Home              Anticipated d/c is to: Home              Anticipated d/c date is: 1 day              Patient currently is not medically stable to d/c.  Still requiring oxygen, hopefully will able to wean off over the next 24 hours while reducing her steroids.  Hopefully home in next 24-48 hours   Difficult to place patient No  Body mass index is 50.96 kg/m.     Subjective: Patient did well over the last 24 hours, and her oxygen levels were weaned down from 7 L high flow to 3 L nasal cannula.  Symptomatically she is feeling little better.  Does not have any other complaints at this time.  Review of Systems Otherwise negative except as per HPI, including: General: Denies fever, chills, night sweats or unintended weight loss. Resp: Denies cough, wheezing, shortness of breath. Cardiac: Denies chest pain, palpitations, orthopnea, paroxysmal nocturnal dyspnea. GI: Denies abdominal pain, nausea, vomiting, diarrhea or constipation GU: Denies dysuria, frequency, hesitancy or incontinence MS: Denies muscle aches, joint pain or swelling Neuro: Denies headache, neurologic deficits (focal weakness, numbness, tingling), abnormal gait Psych: Denies anxiety, depression, SI/HI/AVH Skin: Denies new rashes or lesions ID: Denies sick contacts, exotic exposures, travel  Examination: Constitutional: Not in acute distress, 3 L nasal cannula Respiratory: Mild bilateral rhonchi Cardiovascular: Normal sinus rhythm, no rubs Abdomen: Nontender nondistended good bowel sounds Musculoskeletal: No edema noted Skin: No rashes seen Neurologic: CN 2-12 grossly intact.  And nonfocal Psychiatric: Normal judgment and insight.  Alert and oriented x 3. Normal mood.  Objective: Vitals:   05/27/20 2346 05/28/20 0338 05/28/20 0825 05/28/20 1024  BP: (!) 112/52 (!) 107/59 (!) 129/94 (!) 132/94  Pulse: 85 79 (!) 115  (!) 112  Resp: 20 17 18 20   Temp: 97.6 F (36.4 C) 97.6 F (36.4 C) 97.8 F (36.6 C) 97.8 F (36.6 C)  TempSrc: Oral Oral Oral Oral  SpO2: 99% 96% 94% 97%  Weight:      Height:        Intake/Output Summary (Last 24 hours) at 05/28/2020 1055 Last data filed at 05/28/2020 1022 Gross per 24 hour  Intake 1080 ml  Output 1603 ml  Net -523 ml   Filed Weights   05/24/20 1637 05/24/20 2256  Weight: 127 kg (!) 138.9 kg     Data Reviewed:   CBC: Recent Labs  Lab 05/24/20 1714 05/25/20 0448 05/26/20 0223 05/27/20 0031 05/28/20 0648  WBC 17.4* 13.9* 21.1* 23.2* 24.7*  NEUTROABS 14.8* 11.8*  --   --   --   HGB 13.9 13.2 12.8 13.5 13.8  HCT 42.4 38.0 37.5 41.6 41.9  MCV 90.0 88.0 87.2 88.7 88.0  PLT 322 280 335 363 444*   Basic Metabolic Panel: Recent Labs  Lab 05/24/20 1714 05/25/20 0448 05/26/20 0223 05/27/20 0031 05/28/20 0648  NA 134* 141 138 139 136  K 4.2 4.8 4.3 4.6 4.5  CL 104 111 107 108 103  CO2 18* 16* 21* 21* 22  GLUCOSE 148* 148* 138* 152* 134*  BUN 6 7 13 16 16   CREATININE 0.74 0.73 0.84 0.81 0.74  CALCIUM 8.2* 7.9* 8.4* 8.6* 8.6*  MG  --   --  2.2 2.1 2.2   GFR: Estimated Creatinine Clearance: 117.9 mL/min (by C-G formula based on SCr of 0.74 mg/dL). Liver Function Tests: Recent Labs  Lab 05/24/20 1714 05/25/20 0448  AST 42* 39  ALT 21 17  ALKPHOS 67 68  BILITOT 0.6 0.4  PROT 6.6 6.2*  ALBUMIN 2.4* 2.3*   No results for input(s): LIPASE, AMYLASE in the last 168 hours. No results for input(s): AMMONIA in the last 168 hours. Coagulation Profile: No results for input(s): INR, PROTIME in the last 168 hours. Cardiac Enzymes: No results for input(s): CKTOTAL, CKMB, CKMBINDEX, TROPONINI in the last 168 hours. BNP (last 3 results) No results for input(s): PROBNP in the last 8760 hours. HbA1C: No results for input(s): HGBA1C in the last 72 hours. CBG: No results for input(s): GLUCAP in the last 168 hours. Lipid Profile: No results for  input(s): CHOL, HDL, LDLCALC, TRIG, CHOLHDL, LDLDIRECT in the last 72 hours. Thyroid Function Tests: No results for input(s): TSH, T4TOTAL, FREET4, T3FREE, THYROIDAB in the last 72 hours. Anemia Panel: Recent Labs    05/27/20 0031 05/28/20 0648  FERRITIN 116 129   Sepsis Labs: Recent Labs  Lab 05/24/20 1714 05/24/20 1858 05/24/20 2221  PROCALCITON 0.85  --   --   LATICACIDVEN 2.4* 2.7* 2.5*    Recent Results (from the past 240 hour(s))  Blood Culture (routine x 2)     Status: None (Preliminary result)   Collection Time: 05/24/20  5:14 PM   Specimen: BLOOD  Result Value Ref Range Status   Specimen Description BLOOD LEFT ANTECUBITAL  Final   Special Requests   Final    BOTTLES DRAWN AEROBIC AND ANAEROBIC Blood Culture results may not be optimal due to an inadequate volume of blood received in culture bottles   Culture  Final    NO GROWTH 3 DAYS Performed at [facility], [address] [place], [place] [address]    Report Status PENDING  Incomplete  Blood Culture (routine x 2)     Status: None (Preliminary result)   Collection Time: 05/24/20  5:15 PM   Specimen: BLOOD RIGHT HAND  Result Value Ref Range Status   Specimen Description BLOOD RIGHT HAND  Final   Special Requests   Final    BOTTLES DRAWN AEROBIC AND ANAEROBIC Blood Culture adequate volume   Culture   Final    NO GROWTH 3 DAYS Performed at [facility], [address], [place], [place] [address]    Report Status PENDING  Incomplete  Resp panel by RT-PCR (RSV, Flu A&B, Covid)     Status: Abnormal   Collection Time: 05/25/20  4:17 AM  Result Value Ref Range Status   SARS Coronavirus 2 by RT PCR POSITIVE (A) NEGATIVE Final    Comment: RESULT CALLED TO, READ BACK BY AND VERIFIED WITH: J TOBIAS RN 05/25/20 0545 JDW (NOTE) SARS-CoV-2 target nucleic acids are DETECTED.  The SARS-CoV-2 RNA is generally detectable in upper respiratory specimens during the acute phase of infection. Positive results  are indicative of the presence of the identified virus, but do not rule out bacterial infection or co-infection with other pathogens not detected by the test. Clinical correlation with patient history and other diagnostic information is necessary to determine patient infection status. The expected result is Negative.  Fact Sheet for Patients: BloggerCourse.com  Fact Sheet for Healthcare Providers: SeriousBroker.it  This test is not yet approved or cleared by the [place] FDA and  has been authorized for detection and/or diagnosis of SARS-CoV-2 by FDA under an Emergency Use Authorization (EUA).  This EUA will remain in effect (meaning this test can be used ) for the duration of  the COVID-19 declaration under Section 564(b)(1) of the Act, 21 U.S.C. section 360bbb-3(b)(1), unless the authorization is terminated or revoked sooner.     Influenza A by PCR NEGATIVE NEGATIVE Final   Influenza B by PCR NEGATIVE NEGATIVE Final    Comment: (NOTE) The Xpert Xpress SARS-CoV-2/FLU/RSV plus assay is intended as an aid in the diagnosis of influenza from Nasopharyngeal swab specimens and should not be used as a sole basis for treatment. Nasal washings and aspirates are unacceptable for Xpert Xpress SARS-CoV-2/FLU/RSV testing.  Fact Sheet for Patients: BloggerCourse.com  Fact Sheet for Healthcare Providers: SeriousBroker.it  This test is not yet approved or cleared by the [place] FDA and has been authorized for detection and/or diagnosis of SARS-CoV-2 by FDA under an Emergency Use Authorization (EUA). This EUA will remain in effect (meaning this test can be used) for the duration of the COVID-19 declaration under Section 564(b)(1) of the Act, 21 U.S.C. section 360bbb-3(b)(1), unless the authorization is terminated or revoked.     Resp Syncytial Virus by PCR NEGATIVE NEGATIVE Final     Comment: (NOTE) Fact Sheet for Patients: BloggerCourse.com  Fact Sheet for Healthcare Providers: SeriousBroker.it  This test is not yet approved or cleared by the [place] FDA and has been authorized for detection and/or diagnosis of SARS-CoV-2 by FDA under an Emergency Use Authorization (EUA). This EUA will remain in effect (meaning this test can be used) for the duration of the COVID-19 declaration under Section 564(b)(1) of the Act, 21 U.S.C. section 360bbb-3(b)(1), unless the authorization is terminated or revoked.  Performed at [facility], [address] [place], [place] [id number]          Radiology Studies: No results found.      Scheduled Meds: . vitamin C  500 mg Oral Daily  . baricitinib  4 mg Oral Daily  . cholecalciferol  1,000 Units Oral Daily  . doxycycline  100 mg Oral Q12H  . enoxaparin (LOVENOX) injection  135 mg Subcutaneous Q12H  . influenza vac split quadrivalent PF  0.5 mL Intramuscular Tomorrow-1000  . Ipratropium-Albuterol  1 puff Inhalation Q6H  . methylPREDNISolone (SOLU-MEDROL) injection  40 mg Intravenous Q8H  . zinc sulfate  220 mg Oral Daily   Continuous Infusions:    LOS: 4 days   Time spent= 35 mins    Rilynn Habel Joline Maxcyhirag Rami Budhu, MD [facility]  If 7PM-7AM, please contact night-coverage  05/28/2020, 10:55 AM

## 2020-05-29 ENCOUNTER — Other Ambulatory Visit (HOSPITAL_COMMUNITY): Payer: Self-pay | Admitting: Internal Medicine

## 2020-05-29 DIAGNOSIS — J9601 Acute respiratory failure with hypoxia: Secondary | ICD-10-CM

## 2020-05-29 LAB — CULTURE, BLOOD (ROUTINE X 2)
Culture: NO GROWTH
Culture: NO GROWTH
Special Requests: ADEQUATE

## 2020-05-29 LAB — CBC
HCT: 42.2 % (ref 36.0–46.0)
Hemoglobin: 13.8 g/dL (ref 12.0–15.0)
MCH: 29.1 pg (ref 26.0–34.0)
MCHC: 32.7 g/dL (ref 30.0–36.0)
MCV: 89 fL (ref 80.0–100.0)
Platelets: 494 10*3/uL — ABNORMAL HIGH (ref 150–400)
RBC: 4.74 MIL/uL (ref 3.87–5.11)
RDW: 14.8 % (ref 11.5–15.5)
WBC: 30 10*3/uL — ABNORMAL HIGH (ref 4.0–10.5)
nRBC: 0 % (ref 0.0–0.2)

## 2020-05-29 LAB — BASIC METABOLIC PANEL WITH GFR
Anion gap: 12 (ref 5–15)
BUN: 22 mg/dL — ABNORMAL HIGH (ref 6–20)
CO2: 20 mmol/L — ABNORMAL LOW (ref 22–32)
Calcium: 8.3 mg/dL — ABNORMAL LOW (ref 8.9–10.3)
Chloride: 103 mmol/L (ref 98–111)
Creatinine, Ser: 0.86 mg/dL (ref 0.44–1.00)
GFR, Estimated: >60 mL/min
Glucose, Bld: 129 mg/dL — ABNORMAL HIGH (ref 70–99)
Potassium: 4.4 mmol/L (ref 3.5–5.1)
Sodium: 135 mmol/L (ref 135–145)

## 2020-05-29 LAB — MAGNESIUM: Magnesium: 2.4 mg/dL (ref 1.7–2.4)

## 2020-05-29 LAB — LACTATE DEHYDROGENASE: LDH: 305 U/L — ABNORMAL HIGH (ref 98–192)

## 2020-05-29 LAB — C-REACTIVE PROTEIN: CRP: 0.7 mg/dL

## 2020-05-29 LAB — FERRITIN: Ferritin: 159 ng/mL (ref 11–307)

## 2020-05-29 MED ORDER — IPRATROPIUM-ALBUTEROL 20-100 MCG/ACT IN AERS: 1.0000 | INHALATION_SPRAY | Freq: Two times a day (BID) | RESPIRATORY_TRACT | Status: DC

## 2020-05-29 MED ORDER — IPRATROPIUM-ALBUTEROL 20-100 MCG/ACT IN AERS
1.0000 | INHALATION_SPRAY | Freq: Two times a day (BID) | RESPIRATORY_TRACT | Status: DC
  Administered 2020-05-29: 1 via RESPIRATORY_TRACT

## 2020-05-29 MED ORDER — PANTOPRAZOLE SODIUM 40 MG PO TBEC: 40.0000 mg | DELAYED_RELEASE_TABLET | Freq: Every day | ORAL | 0 refills | Status: DC

## 2020-05-29 MED ORDER — APIXABAN 5 MG PO TABS: 5.0000 mg | ORAL_TABLET | Freq: Two times a day (BID) | ORAL | Status: DC

## 2020-05-29 MED ORDER — DEXAMETHASONE 6 MG PO TABS
6.0000 mg | ORAL_TABLET | Freq: Every day | ORAL | 0 refills | Status: DC
Start: 2020-05-30 — End: 2020-06-04

## 2020-05-29 MED ORDER — APIXABAN 5 MG PO TABS
10.0000 mg | ORAL_TABLET | Freq: Two times a day (BID) | ORAL | Status: DC
  Administered 2020-05-29: 10 mg via ORAL
  Filled 2020-05-29: qty 2

## 2020-05-29 MED ORDER — APIXABAN (ELIQUIS) VTE STARTER PACK (10MG AND 5MG): ORAL_TABLET | ORAL | 0 refills | Status: DC

## 2020-05-29 MED FILL — ELIQUIS STARTER PACK 5 MG T: 5 | 30 days supply | Qty: 74 | Fill #0

## 2020-05-29 MED FILL — PANTOPRAZOLE SOD DR 40 MG T: 40 | 30 days supply | Qty: 30 | Fill #0

## 2020-05-29 MED FILL — DEXAMETHASONE 6 MG TABLET: 6 | 5 days supply | Qty: 5 | Fill #0

## 2020-05-29 NOTE — TOC Transition Note (Signed)
Transition of Care [facility]) - CM/SW Discharge Note   Patient Details  Name: Christina Castro MRN: [id number] Date of Birth: 1970-11-19  [facility]) CM/SW Contact:  Lawerance Sabal, RN Phone Number: 05/29/2020, 12:05 PM   Clinical Narrative:   Sherron Monday w patient over the phone about DC plan. She will DC to home w support of spouse who will provide transportation home. Home oxygen and 3/1 ordered through [employer]. Portable oxygen concentrator and metal tank as well as 3/1 will be delivered to unit and need to be sent home with her. No other CM needs identified.     Final next level of care: Home/Self Care Barriers to Discharge: No Barriers Identified   Patient Goals and CMS Choice        Discharge Placement                       Discharge Plan and Services                DME Arranged: 3-N-1,Oxygen DME Agency: [employer] Date DME Agency Contacted: 05/29/20 Time DME Agency Contacted: 1205 Representative spoke with at DME Agency: [email or web address]            Social Determinants of Health (SDOH) Interventions     Readmission Risk Interventions No flowsheet data found.

## 2020-05-29 NOTE — Progress Notes (Signed)
ANTICOAGULATION CONSULT NOTE - Initial Consult  Pharmacy Consult for Eliquis Indication: DVT  No Known Allergies  Patient Measurements: Height: 5\' 5"  (165.1 cm) Weight: (!) 138.9 kg (306 lb 3.5 oz) IBW/kg (Calculated) : 57  Vital Signs: Temp: 98.1 F (36.7 C) (01/26 0432) Temp Source: Oral (01/26 0432) BP: 110/69 (01/26 0432) Pulse Rate: 92 (01/26 0432)  Labs: Recent Labs    05/27/20 0031 05/28/20 0648 05/29/20 0235  HGB 13.5 13.8 13.8  HCT 41.6 41.9 42.2  PLT 363 444* 494*  CREATININE 0.81 0.74 0.86    Estimated Creatinine Clearance: 109.7 mL/min (by C-G formula based on SCr of 0.86 mg/dL).   Medical History: Past Medical History:  Diagnosis Date  . 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 Depression    "S/P losing my daughter d/t rejection post lung transplant"  . GERD (gastroesophageal reflux disease)   . Headache(784.0)    "related to starting my periods" (09/11/2013)    Medications:  Medications Prior to Admission  Medication Sig Dispense Refill Last Dose  . azithromycin (ZITHROMAX) 250 MG tablet Take 250 mg by mouth as directed.   05/24/2020 at Unknown time  . ergocalciferol (VITAMIN D2) 1.25 MG (50000 UT) capsule Take 50,000 Units by mouth every Monday.   05/13/2020  . Multiple Vitamins-Minerals (WOMENS MULTI GUMMIES PO) Take 1 tablet by mouth daily.   05/24/2020 at Unknown time  . predniSONE (STERAPRED UNI-PAK 21 TAB) 5 MG (21) TBPK tablet Take 5 mg by mouth See admin instructions. 6,5,4,3,2,1 taper dose.   05/24/2020 at Unknown time  . predniSONE (DELTASONE) 20 MG tablet Take 2 tablets (40 mg total) by mouth daily with breakfast. (Patient not taking: No sig reported) 10 tablet 0 Completed Course at Unknown time   Scheduled:  . vitamin C  500 mg Oral Daily  . baricitinib  4 mg Oral Daily  . cholecalciferol  1,000 Units Oral Daily  . doxycycline  100 mg Oral Q12H  . enoxaparin (LOVENOX) injection  135 mg Subcutaneous Q12H  . influenza vac split quadrivalent PF   0.5 mL Intramuscular Tomorrow-1000  . Ipratropium-Albuterol  1 puff Inhalation BID  . methylPREDNISolone (SOLU-MEDROL) injection  40 mg Intravenous Q12H  . zinc sulfate  220 mg Oral Daily    Assessment: 50yo female to transition from LMWH to Eliquis for DVT.   Plan:  D/C Lovenox (last dose 1/25 2200). Eliquis 10mg  PO BID x7 days followed by 5mg  PO BID.  2/25, PharmD, BCPS  05/29/2020,7:23 AM

## 2020-05-29 NOTE — Discharge Summary (Addendum)
Christina Castro, is a 50 y.o. female  DOB 07/01/1970  MRN [id number].  Admission date:  05/24/2020  Admitting Physician  John Giovanni, MD  Discharge Date:  05/29/2020   Primary MD  Patient, No Pcp Per  Recommendations for primary care physician for things to follow:  - please check CBC, CMP during next visit.   Admission Diagnosis  Hypoxia [R09.02] COVID-19 virus infection [U07.1] Pneumonia due to COVID-19 virus [U07.1, J12.82]   Discharge Diagnosis  Hypoxia [R09.02] COVID-19 virus infection [U07.1] Pneumonia due to COVID-19 virus [U07.1, J12.82]    Principal Problem:   Pneumonia due to COVID-19 virus Active Problems:   Morbid obesity (HCC)   Acute hypoxemic respiratory failure (HCC)   Diarrhea      Past Medical History:  Diagnosis Date  . 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 Depression    "S/P losing my daughter d/t rejection post lung transplant"  . GERD (gastroesophageal reflux disease)   . Headache(784.0)    "related to starting my periods" (09/11/2013)    Past Surgical History:  Procedure Laterality Date  . CESAREAN SECTION  1993; 1997; 1998  . CHOLECYSTECTOMY  1993   w/UHR  . CYST EXCISION  1985   "end of my spine"  . GASTRIC BYPASS  1999  . HERNIA REPAIR    . INSERTION OF MESH N/A 09/11/2013   Procedure: INSERTION OF MESH;  Surgeon: Ernestene Mention, MD;  Location: [facility] OR;  Service: General;  Laterality: N/A;  . LAPAROSCOPIC INCISIONAL / UMBILICAL / VENTRAL HERNIA REPAIR  09/11/2013   VHR w/mesh and LOA   . LYSIS OF ADHESION N/A 09/11/2013   Procedure: LAPAROSCOPIC LYSIS OF ADHESION;  Surgeon: Ernestene Mention, MD;  Location: [facility] OR;  Service: General;  Laterality: N/A;  . TONSILLECTOMY  1972  . TUBAL LIGATION  1998  . UMBILICAL HERNIA REPAIR  1993   w/chole  . VENTRAL HERNIA REPAIR N/A 09/11/2013   Procedure: LAPAROSCOPIC VENTRAL HERNIA REPAIR ;  Surgeon: Ernestene Mention,  MD;  Location: [facility] OR;  Service: General;  Laterality: N/A;       History of present illness and  Hospital Course:     Kindly see H&P for history of present illness and admission details, please review complete Labs, Consult reports and Test reports for all details in brief  HPI  from the history and physical done on the day of admission 05/24/2020  HPI: Christina Castro is a 50 y.o. female with medical history significant of GERD, depression, morbid obesity (BMI 46.59) presenting the ED via EMS for evaluation of shortness of breath. Per EMS, her sats dropped to 65% on room air. She was placed on 12 L NRB and sats improved to 91%.  Patient states she did not get vaccinated for COVID as she stays at home mostly.  She did go to a funeral earlier this month and started feeling ill around January 6.  Her symptoms include fevers, fatigue, cough productive of clear sputum, and shortness of breath.  Denies chest pain.  States she tested positive for COVID at urgent care.  She subsequently went to [facility] about 2 or 3 days ago as she was not feeling well and tested positive for COVID again.  She was also told that she was positive for flu.  She was prescribed prednisone, azithromycin, vitamin C, zinc, and vitamin D which she has been taking but has not improved.  She has a pulse ox at home and noticed that her oxygen saturation dropped to 79% on room and she could hardly catch her breath.  States by the time EMS arrived her oxygen saturation dropped to 60s.  She did have 2 episodes of diarrhea today but denies abdominal pain.  She had some nausea earlier which has resolved.  She has not vomited.  States she does not have diabetes and was prescribed metformin by her doctor to help her lose weight.  ED Course: Afebrile. Tachycardic and tachypneic. Not hypotensive. Desatted to mid 80s on 6 L supplemental oxygen in the ED and sats improved to the 90s after she was placed on 15 L HFNC. WBC 17.4 with  neutrophilic predominance, hemoglobin 13.9, platelet count 322K. Sodium 134, potassium 4.2, chloride 104, bicarb 18, anion gap 12, BUN 6, creatinine 0.7, glucose 148. No significant elevation of LFTs. Blood culture x2 pending. Lactic acid 2.4 >2.7. Procalcitonin 0.85. D-dimer 6.05. LDH 541. Fibrinogen 614. CRP 14.4. Chest x-ray showing bilateral groundglass opacities and patchy consolidations in the mid to lower lungs consistent with multifocal pneumonia. CT angiogram chest negative for PE but showing diffuse groundglass opacities bilaterally consistent with COVID-19 viral pneumonia. Patient was given IV Decadron 10 mg and remdesivir.   Hospital Course  50 year old with history of GERD, depression, morbid obesity came to the ER with complaints of shortness of breath and hypoxia diagnosed with COVID-19 pneumonia. She was started on supplemental oxygen, steroids, remdesivir and baricitinib. CTA was negative for PE but showed diffuse groundglass opacities.  Lower extremity ultrasound showed right leg  DVT.  Slowly titrating her oxygen levels down.  Currently on 3 L nasal cannula.    Acute hypoxic respiratory failure secondary to COVID-19 pneumonia (Initially tested positive 05/16/20) -Severe sepsis has been ruled out this admission. -Patient with hypoxia, multifocal pneumonia on admission, treated with steroids, Remdesivir and baricitinib, hypoxia has improved, she is requiring 2 L with activity, she will be discharged on dexamethasone to finish 10 days of steroid treatment. -She was instructed to take her incentive spirometry and flutter valve at home and keep using them.  Elevated D-dimers, right lower extremity DVT -Patient with significantly elevated D-dimers, peaked at  8.5,  CTA with no evidence of PE, but venous Doppler significant for age-indeterminate right posterior tibial vein DVT -Lovenox during hospital stay, she will be discharged on Eliquis, will need 11-month of  anticoagulation.  Diarrhea, COVID-related - Supportive care.  Appears to have resolved  Morbid obesity BMI >50 -Weight loss and exercise  GERD -Protonix       Discharge Condition:  stable    Discharge Instructions  and  Discharge Medications    Discharge Instructions    Diet - low sodium heart healthy   Complete by: As directed    Discharge instructions   Complete by: As directed    Follow with Primary MD  Get CBC, CMP, 2 view Chest X ray checked  by Primary MD next visit.    Activity: As tolerated with Full fall precautions use walker/cane & assistance as needed  Disposition Home    Diet: Regular diet   On your next visit with your primary care physician please Get Medicines reviewed and adjusted.   Please request your Prim.MD to go over all Hospital Tests and Procedure/Radiological results at the follow up, please get all Hospital records sent to your Prim MD by signing hospital release before you go home.   If you experience worsening of your admission symptoms, develop shortness of breath, life threatening emergency, suicidal or homicidal thoughts you must seek medical attention immediately by calling 911 or calling your MD immediately  if symptoms less severe.  You Must read complete instructions/literature along with all the possible adverse reactions/side effects for all the Medicines you take and that have been prescribed to you. Take any new Medicines after you have completely understood and accpet all the possible adverse reactions/side effects.   Do not drive, operating heavy machinery, perform activities at heights, swimming or participation in water activities or provide baby sitting services if your were admitted for syncope or siezures until you have seen by Primary MD or a Neurologist and advised to do so again.  Do not drive when taking Pain medications.    Do not take more than prescribed Pain, Sleep and Anxiety Medications  Special  Instructions: If you have smoked or chewed Tobacco  in the last 2 yrs please stop smoking, stop any regular Alcohol  and or any Recreational drug use.  Wear Seat belts while driving.   Please note  You were cared for by a hospitalist during your hospital stay. If you have any questions about your discharge medications or the care you received while you were in the hospital after you are discharged, you can call the unit and asked to speak with the hospitalist on call if the hospitalist that took care of you is not available. Once you are discharged, your primary care physician will handle any further medical issues. Please note that NO REFILLS for any discharge medications will be authorized once you are discharged, as it is imperative that you return to your primary care physician (or establish a relationship with a primary care physician if you do not have one) for your aftercare needs so that they can reassess your need for medications and monitor your lab values.   Increase activity slowly   Complete by: As directed      Allergies as of 05/29/2020   No Known Allergies     Medication List    STOP taking these medications   azithromycin 250 MG tablet Commonly known as: ZITHROMAX   predniSONE 20 MG tablet Commonly known as: DELTASONE   predniSONE 5 MG (21) Tbpk tablet Commonly known as: STERAPRED UNI-PAK 21 TAB     TAKE these medications   Apixaban Starter Pack (10mg  and 5mg ) Commonly known as: ELIQUIS STARTER PACK Take as directed on package: start with two-5mg  tablets twice daily for 7 days. On day 8, switch to one-5mg  tablet twice daily.   dexamethasone 6 MG tablet Commonly known as: DECADRON Take 1 tablet (6 mg total) by mouth daily. Start taking on: May 30, 2020   ergocalciferol 1.25 MG (50000 UT) capsule Commonly known as: VITAMIN D2 Take 50,000 Units by mouth every Monday.   pantoprazole 40 MG tablet Commonly known as: Protonix Take 1 tablet (40 mg total) by  mouth daily.   WOMENS MULTI GUMMIES PO Take 1 tablet by mouth daily.            Durable Medical Equipment  (  From admission, onward)         Start     Ordered   05/29/20 1213  For home use only DME 3 n 1  Once       Comments: Heavy duty due to body habitus   05/29/20 1214   05/29/20 1157  For home use only DME oxygen  Once       Question Answer Comment  Length of Need 6 Months   Mode or (Route) Nasal cannula   Liters per Minute 2   Frequency Continuous (stationary and portable oxygen unit needed)   Oxygen conserving device Yes   Oxygen delivery system Gas      05/29/20 1157            Diet and Activity recommendation: See Discharge Instructions above   Consults obtained - none   Major procedures and Radiology Reports - PLEASE review detailed and final reports for all details, in brief -      CT Angio Chest PE W/Cm &/Or Wo Cm  Result Date: 05/24/2020 CLINICAL DATA:  Shortness of breath, COVID-19 positivity EXAM: CT ANGIOGRAPHY CHEST WITH CONTRAST TECHNIQUE: Multidetector CT imaging of the chest was performed using the standard protocol during bolus administration of intravenous contrast. Multiplanar CT image reconstructions and MIPs were obtained to evaluate the vascular anatomy. CONTRAST:  75mL OMNIPAQUE IOHEXOL 350 MG/ML SOLN COMPARISON:  Chest x-ray from earlier in the same day. FINDINGS: Cardiovascular: Atherosclerotic calcifications of the thoracic aorta are noted. No aneurysmal dilatation or dissection is seen. No cardiac enlargement is noted. No coronary calcifications are seen. The pulmonary artery is well visualized within normal branching pattern. No filling defect to suggest pulmonary embolism is noted. Mediastinum/Nodes: Thoracic inlet is within normal limits. Lymphadenopathy is noted in the right paratracheal region measuring 2.2 cm in short axis. Scattered smaller mediastinal nodes are seen. Small hilar nodes are noted as well. These are likely reactive in  nature given the changes in the lungs. Lungs/Pleura: Lungs are well aerated bilaterally. Bilateral ground-glass airspace opacity is noted worst in the lung bases consistent with the given clinical history of COVID-19 positivity. This is similar to that seen on the prior chest x-ray. Upper Abdomen: Changes of prior gastric bypass. No other upper abdominal abnormality is noted. Musculoskeletal: Degenerative changes of the thoracic spine are noted. Review of the MIP images confirms the above findings. IMPRESSION: No evidence of pulmonary emboli. Diffuse ground-glass opacities bilaterally consistent with the given clinical history of COVID-19 positivity. Mediastinal lymphadenopathy likely reactive in nature to the changes within the lungs. Electronically Signed   By: Alcide CleverMark  Lukens M.D.   On: 05/24/2020 18:54   DG Chest Port 1 View  Result Date: 05/24/2020 CLINICAL DATA:  Shortness of breath, COVID EXAM: PORTABLE CHEST 1 VIEW COMPARISON:  None. FINDINGS: Fairly widespread bilateral ground-glass opacities and patchy consolidations in the mid to lower lungs. No pleural effusion. Normal cardiomediastinal silhouette. No pneumothorax. IMPRESSION: Fairly widespread bilateral ground-glass opacities and patchy consolidations in the mid to lower lungs, consistent with multifocal pneumonia. Electronically Signed   By: Jasmine PangKim  Fujinaga M.D.   On: 05/24/2020 17:34   VAS US LOWER EXTREMITY VENOUS (DVT)  Result Date: 05/25/2020  Lower Venous DVT Study Indications: Covid, elevated d-dimer.  Anticoagulation: Lovenox. Limitations: Body habitus and poor ultrasound/tissue interface. Comparison Study: No prior studies. Performing Technologist: Jean Rosenthalachel Hodge RDMS  Examination Guidelines: A complete evaluation includes B-mode imaging, spectral Doppler, color Doppler, and power Doppler as needed of all accessible portions of each vessel.  Bilateral testing is considered an integral part of a complete examination. Limited examinations for  reoccurring indications may be performed as noted. The reflux portion of the exam is performed with the patient in reverse Trendelenburg.  +---------+---------------+---------+-----------+----------+-----------------+ RIGHT    CompressibilityPhasicitySpontaneityPropertiesThrombus Aging    +---------+---------------+---------+-----------+----------+-----------------+ CFV      Full           Yes      Yes                                    +---------+---------------+---------+-----------+----------+-----------------+ SFJ      Full                                                           +---------+---------------+---------+-----------+----------+-----------------+ FV Prox  Full                                                           +---------+---------------+---------+-----------+----------+-----------------+ FV Mid   Full                                                           +---------+---------------+---------+-----------+----------+-----------------+ FV DistalFull                                                           +---------+---------------+---------+-----------+----------+-----------------+ PFV      Full                                                           +---------+---------------+---------+-----------+----------+-----------------+ POP      Full           Yes      Yes                                    +---------+---------------+---------+-----------+----------+-----------------+ PTV      Partial        Yes      Yes                  Age Indeterminate +---------+---------------+---------+-----------+----------+-----------------+ PERO     Full                                                           +---------+---------------+---------+-----------+----------+-----------------+   +---------+---------------+---------+-----------+----------+--------------+ LEFT  CompressibilityPhasicitySpontaneityPropertiesThrombus  Aging +---------+---------------+---------+-----------+----------+--------------+ CFV      Full           Yes      Yes                                 +---------+---------------+---------+-----------+----------+--------------+ SFJ      Full                                                        +---------+---------------+---------+-----------+----------+--------------+ FV Prox  Full                                                        +---------+---------------+---------+-----------+----------+--------------+ FV Mid   Full                                                        +---------+---------------+---------+-----------+----------+--------------+ FV DistalFull                                                        +---------+---------------+---------+-----------+----------+--------------+ PFV      Full                                                        +---------+---------------+---------+-----------+----------+--------------+ POP      Full           Yes      Yes                                 +---------+---------------+---------+-----------+----------+--------------+ PTV      Full                                                        +---------+---------------+---------+-----------+----------+--------------+ PERO     Full                                                        +---------+---------------+---------+-----------+----------+--------------+     Summary: RIGHT: - Findings consistent with age indeterminate deep vein thrombosis involving the right posterior tibial veins. - No cystic structure found in the popliteal fossa.  LEFT: - There is no evidence of deep vein thrombosis in the lower extremity.  - No cystic structure found in  the popliteal fossa.  *See table(s) above for measurements and observations. Electronically signed by Waverly Ferrari MD on 05/25/2020 at 3:33:15 PM.    Final     Micro Results     Recent Results  (from the past 240 hour(s))  Blood Culture (routine x 2)     Status: None   Collection Time: 05/24/20  5:14 PM   Specimen: BLOOD  Result Value Ref Range Status   Specimen Description BLOOD LEFT ANTECUBITAL  Final   Special Requests   Final    BOTTLES DRAWN AEROBIC AND ANAEROBIC Blood Culture results may not be optimal due to an inadequate volume of blood received in culture bottles   Culture   Final    NO GROWTH 5 DAYS Performed at [facility], [address] [place], [place] [address]    Report Status 05/29/2020 FINAL  Final  Blood Culture (routine x 2)     Status: None   Collection Time: 05/24/20  5:15 PM   Specimen: BLOOD RIGHT HAND  Result Value Ref Range Status   Specimen Description BLOOD RIGHT HAND  Final   Special Requests   Final    BOTTLES DRAWN AEROBIC AND ANAEROBIC Blood Culture adequate volume   Culture   Final    NO GROWTH 5 DAYS Performed at [facility], [address], [place], [place] [address]    Report Status 05/29/2020 FINAL  Final  Resp panel by RT-PCR (RSV, Flu A&B, Covid)     Status: Abnormal   Collection Time: 05/25/20  4:17 AM  Result Value Ref Range Status   SARS Coronavirus 2 by RT PCR POSITIVE (A) NEGATIVE Final    Comment: RESULT CALLED TO, READ BACK BY AND VERIFIED WITH: J TOBIAS RN 05/25/20 0545 JDW (NOTE) SARS-CoV-2 target nucleic acids are DETECTED.  The SARS-CoV-2 RNA is generally detectable in upper respiratory specimens during the acute phase of infection. Positive results are indicative of the presence of the identified virus, but do not rule out bacterial infection or co-infection with other pathogens not detected by the test. Clinical correlation with patient history and other diagnostic information is necessary to determine patient infection status. The expected result is Negative.  Fact Sheet for Patients: BloggerCourse.com  Fact Sheet for Healthcare  Providers: SeriousBroker.it  This test is not yet approved or cleared by the [place] FDA and  has been authorized for detection and/or diagnosis of SARS-CoV-2 by FDA under an Emergency Use Authorization (EUA).  This EUA will remain in effect (meaning this test can be used ) for the duration of  the COVID-19 declaration under Section 564(b)(1) of the Act, 21 U.S.C. section 360bbb-3(b)(1), unless the authorization is terminated or revoked sooner.     Influenza A by PCR NEGATIVE NEGATIVE Final   Influenza B by PCR NEGATIVE NEGATIVE Final    Comment: (NOTE) The Xpert Xpress SARS-CoV-2/FLU/RSV plus assay is intended as an aid in the diagnosis of influenza from Nasopharyngeal swab specimens and should not be used as a sole basis for treatment. Nasal washings and aspirates are unacceptable for Xpert Xpress SARS-CoV-2/FLU/RSV testing.  Fact Sheet for Patients: BloggerCourse.com  Fact Sheet for Healthcare Providers: SeriousBroker.it  This test is not yet approved or cleared by the [place] FDA and has been authorized for detection and/or diagnosis of SARS-CoV-2 by FDA under an Emergency Use Authorization (EUA). This EUA will remain in effect (meaning this test can be used) for the duration of the COVID-19 declaration under Section 564(b)(1)  of the Act, 21 U.S.C. section 360bbb-3(b)(1), unless the authorization is terminated or revoked.     Resp Syncytial Virus by PCR NEGATIVE NEGATIVE Final    Comment: (NOTE) Fact Sheet for Patients: BloggerCourse.com  Fact Sheet for Healthcare Providers: SeriousBroker.it  This test is not yet approved or cleared by the [place] FDA and has been authorized for detection and/or diagnosis of SARS-CoV-2 by FDA under an Emergency Use Authorization (EUA). This EUA will remain in effect (meaning this test can be  used) for the duration of the COVID-19 declaration under Section 564(b)(1) of the Act, 21 U.S.C. section 360bbb-3(b)(1), unless the authorization is terminated or revoked.  Performed at [facility], [address], [place], [place] [address]        Today   Subjective:   Christina Castro today has no headache,no chest abdominal pain,no new weakness tingling or numbness, feels much better wants to go home today.   Objective:   Blood pressure 110/69, pulse 92, temperature 98.1 F (36.7 C), temperature source Oral, resp. rate 18, height 5\' 5"  (1.651 m), weight (!) 138.9 kg, SpO2 97 %.   Intake/Output Summary (Last 24 hours) at 05/29/2020 1224 Last data filed at 05/29/2020 0944 Gross per 24 hour  Intake 1020 ml  Output --  Net 1020 ml    Exam Awake Alert, Oriented x 3, No new F.N deficits, Normal affect Symmetrical Chest wall movement, Good air movement bilaterally, CTAB RRR,No Gallops,Rubs or new Murmurs, No Parasternal Heave +ve B.Sounds, Abd Soft, Non tender,No rebound -guarding or rigidity. No Cyanosis, Clubbing or edema, No new Rash or bruise  Data Review   CBC w Diff:  Lab Results  Component Value Date   WBC 30.0 (H) 05/29/2020   HGB 13.8 05/29/2020   HCT 42.2 05/29/2020   PLT 494 (H) 05/29/2020   LYMPHOPCT 11 05/25/2020   MONOPCT 3 05/25/2020   EOSPCT 0 05/25/2020   BASOPCT 0 05/25/2020    CMP:  Lab Results  Component Value Date   NA 135 05/29/2020   K 4.4 05/29/2020   CL 103 05/29/2020   CO2 20 (L) 05/29/2020   BUN 22 (H) 05/29/2020   CREATININE 0.86 05/29/2020   PROT 6.2 (L) 05/25/2020   ALBUMIN 2.3 (L) 05/25/2020   BILITOT 0.4 05/25/2020   ALKPHOS 68 05/25/2020   AST 39 05/25/2020   ALT 17 05/25/2020  .   Total Time in preparing paper work, data evaluation and todays exam - 35 minutes  05/27/2020 M.D on 05/29/2020 at 12:24 PM  [facility]   Office  [phone]

## 2020-05-29 NOTE — Progress Notes (Signed)
Occupational Therapy Treatment Patient Details Name: Christina Castro MRN: [id number] DOB: 1971-02-26 Today's Date: 05/29/2020    History of present illness 50 y.o. female admitted on 05/24/20 for SOB, dx with COVID 19 PNA, sepsis, acute hypoxic respiratory failure (initial COVID + was 05/16/20), negative PE, + R LE DVT, diarrhea.  Pt with significant PMH of obesity, HA, depression, anemia, gastric bypass, multiple hernia repairs.   OT comments  Pt seated in recliner upon arrival, pleasant and eager to d/c home (reports likely home today). Focus of session on continued education of activity progression and energy conservation techniques in relation to ADL/functional tasks. Corresponding handout issued and reviewed during session. Pt mobilizing at distant supervision level and reports no difficulty with LB ADL today. All questions answered. Will continue to follow while pt remains acutely admitted.   Follow Up Recommendations  No OT follow up;Supervision - Intermittent    Equipment Recommendations  3 in 1 bedside commode          Precautions / Restrictions Precautions Precautions: None Restrictions Weight Bearing Restrictions: No       Mobility Bed Mobility               General bed mobility comments: OOB in recliner  Transfers Overall transfer level: Independent                        ADL either performed or assessed with clinical judgement   ADL Overall ADL's : Needs assistance/impaired                                     Functional mobility during ADLs: Supervision/safety General ADL Comments: focus of session on further education related to energy conservation techniques as pt prepping for d/c home today. energy conservation handout issued                       Cognition Arousal/Alertness: Awake/alert Behavior During Therapy: WFL for tasks assessed/performed Overall Cognitive Status: Within Functional Limits for tasks assessed                                           Exercises     Shoulder Instructions       General Comments      Pertinent Vitals/ Pain          Home Living                                          Prior Functioning/Environment              Frequency  Min 2X/week        Progress Toward Goals  OT Goals(current goals can now be found in the care plan section)  Progress towards OT goals: Progressing toward goals  Acute Rehab OT Goals Patient Stated Goal: to get her breathing better so she can go home. OT Goal Formulation: With patient Time For Goal Achievement: 06/10/20 Potential to Achieve Goals: Good ADL Goals Pt Will Perform Lower Body Bathing: with modified independence;sit to/from stand Pt Will Perform Lower Body Dressing: with modified independence;sit to/from stand Pt Will Transfer to Toilet: with modified independence;ambulating Pt/caregiver will Perform Home  Exercise Program: Increased strength;With theraband;With written HEP provided;Independently Additional ADL Goal #1: Pt will independently verbalize 3 energy conservation strategies  Plan Discharge plan remains appropriate    Co-evaluation                 AM-PAC OT "6 Clicks" Daily Activity     Outcome Measure   Help from another person eating meals?: None Help from another person taking care of personal grooming?: A Little Help from another person toileting, which includes using toliet, bedpan, or urinal?: A Little Help from another person bathing (including washing, rinsing, drying)?: A Little Help from another person to put on and taking off regular upper body clothing?: A Little Help from another person to put on and taking off regular lower body clothing?: A Little 6 Click Score: 19    End of Session Equipment Utilized During Treatment: Oxygen  OT Visit Diagnosis: Unsteadiness on feet (R26.81);Other abnormalities of gait and mobility (R26.89);Muscle weakness  (generalized) (M62.81)   Activity Tolerance Patient tolerated treatment well   Patient Left in chair;with call bell/phone within reach   Nurse Communication Mobility status        Time: 1448-1856 OT Time Calculation (min): 11 min  Charges: OT General Charges $OT Visit: 1 Visit OT Treatments $Self Care/Home Management : 8-22 mins  Marcy Siren, OT Acute Rehabilitation Services Pager [phone] Office [phone]    Orlando Penner 05/29/2020, 4:23 PM

## 2020-05-29 NOTE — Discharge Instructions (Signed)
Information on my medicine - ELIQUIS (apixaban)  This medication education was reviewed with me or my healthcare representative as part of my discharge preparation.    Why was Eliquis prescribed for you? Eliquis was prescribed to treat blood clots that may have been found in the veins of your legs (deep vein thrombosis) or in your lungs (pulmonary embolism) and to reduce the risk of them occurring again.  What do You need to know about Eliquis ? The starting dose is 10 mg (two 5 mg tablets) taken TWICE daily for the FIRST SEVEN (7) DAYS, then on 06/05/20  the dose is reduced to ONE 5 mg tablet taken TWICE daily.  Eliquis may be taken with or without food.   Try to take the dose about the same time in the morning and in the evening. If you have difficulty swallowing the tablet whole please discuss with your pharmacist how to take the medication safely.  Take Eliquis exactly as prescribed and DO NOT stop taking Eliquis without talking to the doctor who prescribed the medication.  Stopping may increase your risk of developing a new blood clot.  Refill your prescription before you run out.  After discharge, you should have regular check-up appointments with your healthcare provider that is prescribing your Eliquis.    What do you do if you miss a dose? If a dose of ELIQUIS is not taken at the scheduled time, take it as soon as possible on the same day and twice-daily administration should be resumed. The dose should not be doubled to make up for a missed dose.  Important Safety Information A possible side effect of Eliquis is bleeding. You should call your healthcare provider right away if you experience any of the following: ? Bleeding from an injury or your nose that does not stop. ? Unusual colored urine (red or dark brown) or unusual colored stools (red or black). ? Unusual bruising for unknown reasons. ? A serious fall or if you hit your head (even if there is no bleeding).  Some  medicines may interact with Eliquis and might increase your risk of bleeding or clotting while on Eliquis. To help avoid this, consult your healthcare provider or pharmacist prior to using any new prescription or non-prescription medications, including herbals, vitamins, non-steroidal anti-inflammatory drugs (NSAIDs) and supplements.  This website has more information on Eliquis (apixaban): http://www.eliquis.com/eliquis/home  

## 2020-05-29 NOTE — Progress Notes (Signed)
SATURATION QUALIFICATIONS: (This note is used to comply with regulatory documentation for home oxygen)  Patient Saturations on Room Air at Rest = 90%  Patient Saturations on Room Air while Ambulating = 85%  Patient Saturations on 2 Liters of oxygen while Ambulating = 93%  Please briefly explain why patient needs home oxygen: patient de-sats with ambulation needs home o2

## 2020-05-30 ENCOUNTER — Telehealth (HOSPITAL_COMMUNITY): Payer: Self-pay

## 2020-05-30 ENCOUNTER — Telehealth: Payer: Self-pay

## 2020-05-30 NOTE — Telephone Encounter (Signed)
Pharmacy Transitions of Care Follow-up Telephone Call  Date of discharge: 05/29/20 Discharge Diagnosis: COVID  How have you been since you were released from the hospital? Patient is feeling well, started Eliquis starter pack last night. Knows s/sx of bleeding to watch for.  Medication changes made at discharge: yes  Medication changes obtained and verified? yes    Medication Accessibility:  Home Pharmacy: [facility]   Was the patient provided with refills on discharged medications? No   . Is the patient able to afford medications? yes    Medication Review:  APIXABEN (ELIQUIS)  Apixaban 10 mg BID initiated on 05/29/20. Will switch to apixaban 5 mg BID after 7 days.  - Discussed importance of taking medication around the same time everyday  - Advised patient of medications to avoid (NSAIDs, ASA)  - Educated that Tylenol (acetaminophen) will be the preferred analgesic to prevent risk of bleeding  - Emphasized importance of monitoring for signs and symptoms of bleeding (abnormal bruising, prolonged bleeding, nose bleeds, bleeding from gums, discolored urine, black tarry stools)   Follow-up Appointments:  Patient has follow up scheduled at the COVID clinic with family medicine on 06/04/20. She has been instructed to ask about refills at this time and she knows she's supposed to be on 3 months of therapy.  If their condition worsens, is the pt aware to call PCP or go to the Emergency Dept.? yes  Final Patient Assessment: Patient is doing well, has follow up scheduled and knows to get refills sent to home pharmacy at this time.

## 2020-05-30 NOTE — Telephone Encounter (Signed)
Appointment made at [facility] for 06/04/20 at 09:30.  Spoke w patient by phone, informed of appointment date, time, address, and phone number for clinic.  Patient voiced understanding and appreciative.

## 2020-06-04 ENCOUNTER — Ambulatory Visit (INDEPENDENT_AMBULATORY_CARE_PROVIDER_SITE_OTHER): Payer: 59 | Admitting: Nurse Practitioner

## 2020-06-04 VITALS — BP 110/70 | HR 114 | Temp 97.2°F | Ht 64.0 in | Wt 308.0 lb

## 2020-06-04 DIAGNOSIS — J1282 Pneumonia due to coronavirus disease 2019: Secondary | ICD-10-CM

## 2020-06-04 DIAGNOSIS — R7989 Other specified abnormal findings of blood chemistry: Secondary | ICD-10-CM | POA: Diagnosis not present

## 2020-06-04 DIAGNOSIS — U071 COVID-19: Secondary | ICD-10-CM

## 2020-06-04 DIAGNOSIS — J9601 Acute respiratory failure with hypoxia: Secondary | ICD-10-CM

## 2020-06-04 NOTE — Progress Notes (Unsigned)
@Patient  ID: Christina Castro, female    DOB: [birth year], 50 y.o.   MRN: [id number]  Chief Complaint  Patient presents with  . Hospitalization Follow-up    COVID +1/13 in hosp 1/21-1/26, not vaccinated received COVID treatment while admitted. Now on 1L as of Saturday. Sx: coughing up phlegm, night sweats. Is trying to move around more without O2 will notice it drops to mid 80s she will then put on O2 and stats will go back to normal. Finished decadron yesterday    Referring provider: No ref. provider found   49 y.o.femalewith medical history significant ofGERD, depression, morbid obesity.  Recent Significant Events:  Hospital Course: admitted 05/24/20: 50 year old with history of GERD, depression, morbid obesity came to the ER with complaints of shortness of breath and hypoxia diagnosed with COVID-19 pneumonia. She was started on supplemental oxygen, steroids, remdesivir and baricitinib. CTA was negative for PE but showed diffuse groundglass opacities. Lower extremity ultrasound showed right leg  DVT. Slowly titrating her oxygen levels down. Currently on 3 L nasal cannula.    Acute hypoxic respiratory failure secondary to COVID-19 pneumonia (Initially tested positive 05/16/20) -Severe sepsis has been ruled out this admission. -Patient with hypoxia, multifocal pneumonia on admission, treated with steroids, Remdesivir and baricitinib, hypoxia has improved, she is requiring 2 L with activity, she will be discharged on dexamethasone to finish 10 days of steroid treatment. -She was instructed to take her incentive spirometry and flutter valve at home and keep using them.  Elevated D-dimers, right lower extremity DVT -Patient with significantly elevated D-dimers, peaked at  8.5,  CTA with no evidence of PE, but venous Doppler significant for age-indeterminate right posterior tibial vein DVT -Lovenox during hospital stay, she will be discharged on Eliquis, will need 44-month of  anticoagulation.  Diarrhea, COVID-related -Supportive care. Appears to have resolved  Morbid obesity BMI >50 -Weight loss and exercise  GERD -Protonix  HPI  Patient presents today for post COVID care clinic visit/hospital follow-up.  Patient was admitted to the hospital on 05/24/2020 through 05/29/2020.  She was admitted for COVID pneumonia and right lower extremity DVT.  Patient was discharged home on oxygen.  She states that she has been doing well since hospital discharge she is using 1 L of O2 with activity and room air at rest.  Patient is currently on Eliquis and will need 3 months of anticoagulation. Denies f/c/s, n/v/d, hemoptysis, PND, chest pain or edema.      No Known Allergies  There is no immunization history for the selected administration types on file for this patient.  Past Medical History:  Diagnosis Date  . 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 Depression    "S/P losing my daughter d/t rejection post lung transplant"  . GERD (gastroesophageal reflux disease)   . Headache(784.0)    "related to starting my periods" (09/11/2013)    Tobacco History: Social History   Tobacco Use  Smoking Status Never Smoker  Smokeless Tobacco Never Used   Counseling given: Yes   Outpatient Encounter Medications as of 06/04/2020  Medication Sig  . APIXABAN (ELIQUIS) VTE STARTER PACK (10MG  AND 5MG ) Take as directed on package: start with two-5mg  tablets twice daily for 7 days. On day 8, switch to one-5mg  tablet twice daily.  . ergocalciferol (VITAMIN D2) 1.25 MG (50000 UT) capsule Take 50,000 Units by mouth every Monday.  . Multiple Vitamins-Minerals (WOMENS MULTI GUMMIES PO) Take 1 tablet by mouth daily.  . pantoprazole (PROTONIX) 40 MG tablet  Take 1 tablet (40 mg total) by mouth daily.  . [DISCONTINUED] dexamethasone (DECADRON) 6 MG tablet Take 1 tablet (6 mg total) by mouth daily.  . [DISCONTINUED] sertraline (ZOLOFT) 50 MG tablet Take 50 mg by mouth daily.    No  facility-administered encounter medications on file as of 06/04/2020.     Review of Systems  Review of Systems  Constitutional: Negative.  Negative for fatigue and fever.  HENT: Negative.   Respiratory: Positive for shortness of breath. Negative for cough.   Cardiovascular: Negative.  Negative for chest pain, palpitations and leg swelling.  Gastrointestinal: Negative.   Allergic/Immunologic: Negative.   Neurological: Negative.   Psychiatric/Behavioral: Negative.        Physical Exam  BP 110/70   Pulse (!) 114   Temp (!) 97.2 F (36.2 C)   Ht 5\' 4"  (1.626 m)   Wt (!) 308 lb 0.1 oz (139.7 kg)   SpO2 97%   BMI 52.87 kg/m   Wt Readings from Last 5 Encounters:  06/04/20 (!) 308 lb 0.1 oz (139.7 kg)  05/24/20 (!) 306 lb 3.5 oz (138.9 kg)  08/14/15 291 lb 3.2 oz (132.1 kg)  11/09/13 275 lb 9.6 oz (125 kg)  09/28/13 273 lb 12.8 oz (124.2 kg)     Physical Exam   Lab Results:  CBC    Component Value Date/Time   WBC 23.2 (HH) 06/04/2020 1019   WBC 30.0 (H) 05/29/2020 0235   RBC 4.39 06/04/2020 1019   RBC 4.74 05/29/2020 0235   HGB 12.6 06/04/2020 1019   HCT 39.2 06/04/2020 1019   PLT 626 (H) 06/04/2020 1019   MCV 89 06/04/2020 1019   MCH 28.7 06/04/2020 1019   MCH 29.1 05/29/2020 0235   MCHC 32.1 06/04/2020 1019   MCHC 32.7 05/29/2020 0235   RDW 15.0 06/04/2020 1019   LYMPHSABS 1.5 05/25/2020 0448   MONOABS 0.4 05/25/2020 0448   EOSABS 0.0 05/25/2020 0448   BASOSABS 0.0 05/25/2020 0448    BMET    Component Value Date/Time   NA 142 06/04/2020 1019   K 4.2 06/04/2020 1019   CL 107 (H) 06/04/2020 1019   CO2 22 06/04/2020 1019   GLUCOSE 101 (H) 06/04/2020 1019   GLUCOSE 129 (H) 05/29/2020 0235   BUN 9 06/04/2020 1019   CREATININE 0.67 06/04/2020 1019   CALCIUM 8.7 06/04/2020 1019   GFRNONAA 102 06/04/2020 1019   GFRNONAA >60 05/29/2020 0235   GFRAA 118 06/04/2020 1019    BNP    Component Value Date/Time   BNP 25.3 05/28/2020 0648    ProBNP No  results found for: PROBNP  Imaging: CT Angio Chest PE W/Cm &/Or Wo Cm  Result Date: 05/24/2020 CLINICAL DATA:  Shortness of breath, COVID-19 positivity EXAM: CT ANGIOGRAPHY CHEST WITH CONTRAST TECHNIQUE: Multidetector CT imaging of the chest was performed using the standard protocol during bolus administration of intravenous contrast. Multiplanar CT image reconstructions and MIPs were obtained to evaluate the vascular anatomy. CONTRAST:  28mL OMNIPAQUE IOHEXOL 350 MG/ML SOLN COMPARISON:  Chest x-ray from earlier in the same day. FINDINGS: Cardiovascular: Atherosclerotic calcifications of the thoracic aorta are noted. No aneurysmal dilatation or dissection is seen. No cardiac enlargement is noted. No coronary calcifications are seen. The pulmonary artery is well visualized within normal branching pattern. No filling defect to suggest pulmonary embolism is noted. Mediastinum/Nodes: Thoracic inlet is within normal limits. Lymphadenopathy is noted in the right paratracheal region measuring 2.2 cm in short axis. Scattered smaller mediastinal nodes are  seen. Small hilar nodes are noted as well. These are likely reactive in nature given the changes in the lungs. Lungs/Pleura: Lungs are well aerated bilaterally. Bilateral ground-glass airspace opacity is noted worst in the lung bases consistent with the given clinical history of COVID-19 positivity. This is similar to that seen on the prior chest x-ray. Upper Abdomen: Changes of prior gastric bypass. No other upper abdominal abnormality is noted. Musculoskeletal: Degenerative changes of the thoracic spine are noted. Review of the MIP images confirms the above findings. IMPRESSION: No evidence of pulmonary emboli. Diffuse ground-glass opacities bilaterally consistent with the given clinical history of COVID-19 positivity. Mediastinal lymphadenopathy likely reactive in nature to the changes within the lungs. Electronically Signed   By: Alcide CleverMark  Lukens M.D.   On: 05/24/2020  18:54   DG Chest Port 1 View  Result Date: 05/24/2020 CLINICAL DATA:  Shortness of breath, COVID EXAM: PORTABLE CHEST 1 VIEW COMPARISON:  None. FINDINGS: Fairly widespread bilateral ground-glass opacities and patchy consolidations in the mid to lower lungs. No pleural effusion. Normal cardiomediastinal silhouette. No pneumothorax. IMPRESSION: Fairly widespread bilateral ground-glass opacities and patchy consolidations in the mid to lower lungs, consistent with multifocal pneumonia. Electronically Signed   By: Jasmine PangKim  Fujinaga M.D.   On: 05/24/2020 17:34   VAS US LOWER EXTREMITY VENOUS (DVT)  Result Date: 05/25/2020  Lower Venous DVT Study Indications: Covid, elevated d-dimer.  Anticoagulation: Lovenox. Limitations: Body habitus and poor ultrasound/tissue interface. Comparison Study: No prior studies. Performing Technologist: Jean Rosenthalachel Hodge RDMS  Examination Guidelines: A complete evaluation includes B-mode imaging, spectral Doppler, color Doppler, and power Doppler as needed of all accessible portions of each vessel. Bilateral testing is considered an integral part of a complete examination. Limited examinations for reoccurring indications may be performed as noted. The reflux portion of the exam is performed with the patient in reverse Trendelenburg.  +---------+---------------+---------+-----------+----------+-----------------+ RIGHT    CompressibilityPhasicitySpontaneityPropertiesThrombus Aging    +---------+---------------+---------+-----------+----------+-----------------+ CFV      Full           Yes      Yes                                    +---------+---------------+---------+-----------+----------+-----------------+ SFJ      Full                                                           +---------+---------------+---------+-----------+----------+-----------------+ FV Prox  Full                                                            +---------+---------------+---------+-----------+----------+-----------------+ FV Mid   Full                                                           +---------+---------------+---------+-----------+----------+-----------------+ FV DistalFull                                                           +---------+---------------+---------+-----------+----------+-----------------+  PFV      Full                                                           +---------+---------------+---------+-----------+----------+-----------------+ POP      Full           Yes      Yes                                    +---------+---------------+---------+-----------+----------+-----------------+ PTV      Partial        Yes      Yes                  Age Indeterminate +---------+---------------+---------+-----------+----------+-----------------+ PERO     Full                                                           +---------+---------------+---------+-----------+----------+-----------------+   +---------+---------------+---------+-----------+----------+--------------+ LEFT     CompressibilityPhasicitySpontaneityPropertiesThrombus Aging +---------+---------------+---------+-----------+----------+--------------+ CFV      Full           Yes      Yes                                 +---------+---------------+---------+-----------+----------+--------------+ SFJ      Full                                                        +---------+---------------+---------+-----------+----------+--------------+ FV Prox  Full                                                        +---------+---------------+---------+-----------+----------+--------------+ FV Mid   Full                                                        +---------+---------------+---------+-----------+----------+--------------+ FV DistalFull                                                         +---------+---------------+---------+-----------+----------+--------------+ PFV      Full                                                        +---------+---------------+---------+-----------+----------+--------------+  POP      Full           Yes      Yes                                 +---------+---------------+---------+-----------+----------+--------------+ PTV      Full                                                        +---------+---------------+---------+-----------+----------+--------------+ PERO     Full                                                        +---------+---------------+---------+-----------+----------+--------------+     Summary: RIGHT: - Findings consistent with age indeterminate deep vein thrombosis involving the right posterior tibial veins. - No cystic structure found in the popliteal fossa.  LEFT: - There is no evidence of deep vein thrombosis in the lower extremity.  - No cystic structure found in the popliteal fossa.  *See table(s) above for measurements and observations. Electronically signed by Waverly Ferrari MD on 05/25/2020 at 3:33:15 PM.    Final      Assessment & Plan:   Pneumonia due to COVID-19 virus Stay well hydrated  Stay active  Deep breathing exercises  May take mucinex  twice daily  Continue to wean O2 to keep sats above 93%  Will check labs   Will give hand out on home PT    Elevated D-dimers, right lower extremity DVT:  -Patient with significantly elevated D-dimers, peaked at  8.5,  CTA with no evidence of PE, but venous Doppler significant for age-indeterminate right posterior tibial vein DVT -Continue Eliquis, will need 61-month of anticoagulation.   GERD:  -Protonix  Snoring:  Will need referral to pulmonary - sleep study   Follow up:  Follow up in 3 weeks - will need repeat imaging      Ivonne Andrew, NP 06/06/2020

## 2020-06-04 NOTE — Patient Instructions (Addendum)
Acute hypoxic respiratory failure secondary to COVID-19 pneumonia (Initially tested positive 05/16/20):  Stay well hydrated  Stay active  Deep breathing exercises  May take mucinex  twice daily  Continue to wean O2 to keep sats above 93%  Will check labs   Will give hand out on home PT    Elevated D-dimers, right lower extremity DVT:  -Patient with significantly elevated D-dimers, peaked at  8.5,  CTA with no evidence of PE, but venous Doppler significant for age-indeterminate right posterior tibial vein DVT -Continue Eliquis, will need 42-month of anticoagulation.   GERD:  -Protonix  Snoring:  Will need referral to pulmonary - sleep study   Follow up:  Follow up in 3 weeks - will need repeat imaging

## 2020-06-05 LAB — COMPREHENSIVE METABOLIC PANEL
ALT: 41 IU/L — ABNORMAL HIGH (ref 0–32)
AST: 17 IU/L (ref 0–40)
Albumin/Globulin Ratio: 1.3 (ref 1.2–2.2)
Albumin: 3.4 g/dL — ABNORMAL LOW (ref 3.8–4.9)
Alkaline Phosphatase: 61 IU/L (ref 44–121)
BUN/Creatinine Ratio: 13 (ref 9–23)
BUN: 9 mg/dL (ref 6–24)
Bilirubin Total: 0.2 mg/dL (ref 0.0–1.2)
CO2: 22 mmol/L (ref 20–29)
Calcium: 8.7 mg/dL (ref 8.7–10.2)
Chloride: 107 mmol/L — ABNORMAL HIGH (ref 96–106)
Creatinine, Ser: 0.67 mg/dL (ref 0.57–1.00)
GFR calc Af Amer: 118 mL/min/{1.73_m2} (ref >59)
GFR calc non Af Amer: 102 mL/min/{1.73_m2} (ref >59)
Globulin, Total: 2.6 g/dL (ref 1.5–4.5)
Glucose: 101 mg/dL — ABNORMAL HIGH (ref 65–99)
Potassium: 4.2 mmol/L (ref 3.5–5.2)
Sodium: 142 mmol/L (ref 134–144)
Total Protein: 6 g/dL (ref 6.0–8.5)

## 2020-06-05 LAB — CBC
Hematocrit: 39.2 % (ref 34.0–46.6)
Hemoglobin: 12.6 g/dL (ref 11.1–15.9)
MCH: 28.7 pg (ref 26.6–33.0)
MCHC: 32.1 g/dL (ref 31.5–35.7)
MCV: 89 fL (ref 79–97)
Platelets: 626 10*3/uL — ABNORMAL HIGH (ref 150–450)
RBC: 4.39 x10E6/uL (ref 3.77–5.28)
RDW: 15 % (ref 11.7–15.4)
WBC: 23.2 10*3/uL (ref 3.4–10.8)

## 2020-06-05 LAB — D-DIMER, QUANTITATIVE: D-DIMER: 0.94 mg/L FEU — ABNORMAL HIGH (ref 0.00–0.49)

## 2020-06-06 NOTE — Assessment & Plan Note (Signed)
Stay well hydrated  Stay active  Deep breathing exercises  May take mucinex  twice daily  Continue to wean O2 to keep sats above 93%  Will check labs   Will give hand out on home PT    Elevated D-dimers, right lower extremity DVT:  -Patient with significantly elevated D-dimers, peaked at  8.5,  CTA with no evidence of PE, but venous Doppler significant for age-indeterminate right posterior tibial vein DVT -Continue Eliquis, will need 41-month of anticoagulation.   GERD:  -Protonix  Snoring:  Will need referral to pulmonary - sleep study   Follow up:  Follow up in 3 weeks - will need repeat imaging

## 2020-06-25 ENCOUNTER — Ambulatory Visit
Admission: RE | Admit: 2020-06-25 | Discharge: 2020-06-25 | Disposition: A | Discharge status: Home / Self Care | Payer: 59 | Source: Ambulatory Visit | Attending: Nurse Practitioner | Admitting: Nurse Practitioner

## 2020-06-25 ENCOUNTER — Ambulatory Visit (INDEPENDENT_AMBULATORY_CARE_PROVIDER_SITE_OTHER): Payer: 59 | Admitting: Nurse Practitioner

## 2020-06-25 VITALS — BP 114/69 | HR 101 | Temp 96.6°F

## 2020-06-25 DIAGNOSIS — U071 COVID-19: Secondary | ICD-10-CM

## 2020-06-25 DIAGNOSIS — J9601 Acute respiratory failure with hypoxia: Secondary | ICD-10-CM

## 2020-06-25 DIAGNOSIS — R0683 Snoring: Secondary | ICD-10-CM | POA: Diagnosis not present

## 2020-06-25 DIAGNOSIS — J1282 Pneumonia due to coronavirus disease 2019: Secondary | ICD-10-CM

## 2020-06-25 MED ORDER — PANTOPRAZOLE SODIUM 40 MG PO TBEC: 40.0000 mg | DELAYED_RELEASE_TABLET | Freq: Every day | ORAL | 0 refills | Status: DC

## 2020-06-25 MED ORDER — APIXABAN 5 MG PO TABS: 5.0000 mg | ORAL_TABLET | Freq: Two times a day (BID) | ORAL | 1 refills | Status: DC

## 2020-06-25 NOTE — Progress Notes (Signed)
@Patient  ID: , female    DOB: [birth year], 50 y.o.   MRN: [id number]  Chief Complaint  Patient presents with  . Follow-up    Follow up - improving, still on 1 L O2    Referring provider: No ref. provider found   49 y.o.femalewith medical history significant ofGERD, depression, morbid obesity.  Recent Significant Events:  Hospital Course: admitted 05/24/20: 50 year old with history of GERD, depression, morbid obesity came to the ER with complaints of shortness of breath and hypoxia diagnosed with COVID-19 pneumonia. She was started on supplemental oxygen, steroids, remdesivir and baricitinib. CTA was negative for PE but showed diffuse groundglass opacities. Lower extremity ultrasound showedright legDVT. Slowly titrating her oxygen levels down. Currently on 3 L nasal cannula.    Acute hypoxic respiratory failure secondary to COVID-19 pneumonia (Initially tested positive 05/16/20) -Severe sepsis has been ruled out this admission. -Patient with hypoxia, multifocal pneumonia on admission, treated with steroids, Remdesivir and baricitinib, hypoxia has improved, she is requiring 2 L with activity, she will be discharged on dexamethasone to finish 10 days of steroid treatment. -She was instructed to take her incentive spirometry and flutter valve at home and keep using them.  Elevated D-dimers, right lower extremity DVT -Patient with significantly elevated D-dimers,peaked at8.5,CTA with no evidence of PE, but venous Doppler significant for age-indeterminate right posterior tibial vein DVT -Lovenox during hospital stay, she will be discharged on Eliquis, will need 45-month of anticoagulation.  Diarrhea, COVID-related -Supportive care. Appears to have resolved  Morbid obesity BMI >50 -Weight loss and exercise  GERD -Protonix  HPI  Patient presents today for post COVID care clinic follow-up.  Patient was admitted to the hospital on 05/24/2020 through  05/29/2020.    Patient was last seen in this office on 06/04/2020.  She was admitted for COVID pneumonia and right lower extremity DVT.  Patient was discharged home on oxygen.  She states that she has been doing well since her last visit here.  She is using 1 L of O2 with activity and room air at rest.  Patient is currently on Eliquis and will need 3 months of anticoagulation.  Patient does need refill on Eliquis today.  Patient does need repeat imaging today.  Patient has noticed that she snores at night.  And we will refer her to pulmonary for a sleep study.  Denies f/c/s, n/v/d, hemoptysis, PND, chest pain or edema.    No Known Allergies  There is no immunization history for the selected administration types on file for this patient.  Past Medical History:  Diagnosis Date  . 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 Depression    "S/P losing my daughter d/t rejection post lung transplant"  . GERD (gastroesophageal reflux disease)   . Headache(784.0)    "related to starting my periods" (09/11/2013)    Tobacco History: Social History   Tobacco Use  Smoking Status Never Smoker  Smokeless Tobacco Never Used   Counseling given: Yes   Outpatient Encounter Medications as of 06/25/2020  Medication Sig  . apixaban (ELIQUIS) 5 MG TABS tablet Take 1 tablet (5 mg total) by mouth 2 (two) times daily.  . ergocalciferol (VITAMIN D2) 1.25 MG (50000 UT) capsule Take 50,000 Units by mouth every Monday.  . Multiple Vitamins-Minerals (WOMENS MULTI GUMMIES PO) Take 1 tablet by mouth daily.  . pantoprazole (PROTONIX) 40 MG tablet Take 1 tablet (40 mg total) by mouth daily.  . [DISCONTINUED] APIXABAN (ELIQUIS) VTE STARTER PACK (10MG  AND  5MG ) Take as directed on package: start with two-5mg  tablets twice daily for 7 days. On day 8, switch to one-5mg  tablet twice daily.  . [DISCONTINUED] pantoprazole (PROTONIX) 40 MG tablet Take 1 tablet (40 mg total) by mouth daily.  . [DISCONTINUED] sertraline (ZOLOFT) 50 MG  tablet Take 50 mg by mouth daily.    No facility-administered encounter medications on file as of 06/25/2020.     Review of Systems  Review of Systems  Constitutional: Negative.  Negative for fatigue and fever.  HENT: Negative.   Respiratory: Positive for shortness of breath. Negative for cough.   Cardiovascular: Negative.  Negative for chest pain, palpitations and leg swelling.  Gastrointestinal: Negative.   Allergic/Immunologic: Negative.   Neurological: Negative.   Psychiatric/Behavioral: Negative.        Physical Exam  BP 114/69   Pulse (!) 101   Temp (!) 96.6 F (35.9 C)   SpO2 98% Comment: 1 L  Wt Readings from Last 5 Encounters:  06/04/20 (!) 308 lb 0.1 oz (139.7 kg)  05/24/20 (!) 306 lb 3.5 oz (138.9 kg)  08/14/15 291 lb 3.2 oz (132.1 kg)  11/09/13 275 lb 9.6 oz (125 kg)  09/28/13 273 lb 12.8 oz (124.2 kg)     Physical Exam Vitals and nursing note reviewed.  Constitutional:      General: She is not in acute distress.    Appearance: She is well-developed and well-nourished.  Cardiovascular:     Rate and Rhythm: Normal rate and regular rhythm.  Pulmonary:     Effort: Pulmonary effort is normal.     Breath sounds: Normal breath sounds.  Musculoskeletal:     Right lower leg: No edema.     Left lower leg: No edema.  Neurological:     Mental Status: She is alert and oriented to person, place, and time.  Psychiatric:        Mood and Affect: Mood and affect and mood normal.        Behavior: Behavior normal.     No results found.   Assessment & Plan:   Pneumonia due to COVID-19 virus Stay well hydrated  Stay active  Deep breathing exercises  May take mucinex  twice daily  Continue to wean O2 to keep sats above 93%   Will order follow up chest x ray:  [facility] [address] [place], [place] [phone] MON - FRI 8:00 AM - 4:00 PM - WALK IN    Elevated D-dimers, right lower extremity DVT:  -Patient with  significantly elevated D-dimers,peaked at8.5,CTA with no evidence of PE, but venous Doppler significant for age-indeterminate right posterior tibial vein DVT  -Continue Eliquis, will need 31-month of anticoagulation.   GERD:  -Protonix  Snoring:  Will need referral to pulmonary - sleep study   Follow up:  Follow up in 1 month      2-month, NP 06/25/2020

## 2020-06-25 NOTE — Patient Instructions (Addendum)
Pneumonia due to COVID-19 virus:   Stay well hydrated  Stay active  Deep breathing exercises  May take mucinex  twice daily  Continue to wean O2 to keep sats above 93%   Will order follow up chest x ray:  [facility] [address] [place], [place] [address] [phone] MON - FRI 8:00 AM - 4:00 PM - WALK IN    Elevated D-dimers, right lower extremity DVT:  -Patient with significantly elevated D-dimers,peaked at8.5,CTA with no evidence of PE, but venous Doppler significant for age-indeterminate right posterior tibial vein DVT  -Continue Eliquis, will need 2-month of anticoagulation.   GERD:  -Protonix  Snoring:  Will need referral to pulmonary - sleep study   Follow up:  Follow up in 1 month

## 2020-06-25 NOTE — Assessment & Plan Note (Signed)
Stay well hydrated  Stay active  Deep breathing exercises  May take mucinex  twice daily  Continue to wean O2 to keep sats above 93%   Will order follow up chest x ray:  [facility] [address] [place], [place] [address] [phone] MON - FRI 8:00 AM - 4:00 PM - WALK IN    Elevated D-dimers, right lower extremity DVT:  -Patient with significantly elevated D-dimers,peaked at8.5,CTA with no evidence of PE, but venous Doppler significant for age-indeterminate right posterior tibial vein DVT  -Continue Eliquis, will need 4-month of anticoagulation.   GERD:  -Protonix  Snoring:  Will need referral to pulmonary - sleep study   Follow up:  Follow up in 1 month

## 2020-06-26 ENCOUNTER — Other Ambulatory Visit: Payer: Self-pay | Admitting: Nurse Practitioner

## 2020-07-02 ENCOUNTER — Ambulatory Visit: Payer: 59 | Admitting: Family Medicine

## 2020-07-22 ENCOUNTER — Other Ambulatory Visit: Payer: Self-pay

## 2020-07-22 ENCOUNTER — Ambulatory Visit (INDEPENDENT_AMBULATORY_CARE_PROVIDER_SITE_OTHER): Payer: 59 | Admitting: Pulmonary Disease

## 2020-07-22 ENCOUNTER — Encounter: Payer: Self-pay | Admitting: Pulmonary Disease

## 2020-07-22 VITALS — BP 116/74 | HR 90 | Temp 97.8°F | Ht 64.0 in | Wt 328.6 lb

## 2020-07-22 DIAGNOSIS — R0609 Other forms of dyspnea: Secondary | ICD-10-CM

## 2020-07-22 DIAGNOSIS — U099 Post covid-19 condition, unspecified: Secondary | ICD-10-CM

## 2020-07-22 NOTE — Patient Instructions (Signed)
Continue breztri Order high-res CT, PFTs Home sleep study  Follow-up in 1 to 2 months.

## 2020-07-22 NOTE — Progress Notes (Signed)
Christina Castro    [id number]    16-Dec-1970  Primary Care Physician:Patient, No Pcp Per  Referring Physician: Ivonne Andrew, NP [address] [place],  [place] [address]  Chief complaint: Consult for post COVID-32  HPI: 50 year old with GERD, depression, obesity Admitted in January 2022 with COVID-19 pneumonia.  Treated with remdesivir, baricitinib, steroids.  She did not have a PE but noted to have DVT for which she is on anticoagulation Discharged with 2 L oxygen and steroid for 10 days  She continues to use oxygen intermittently at night, notes daytime somnolence, fatigue and witnessed apneas.  Pets: No pets Occupation: Account specialist at a business Exposures: No mold, hot tub, Jacuzzi.  No feather pillows or comforter Smoking history: Never smoker Travel history: Originally from [place].  No significant recent travel Relevant family history: No family history of lung disease  Outpatient Encounter Medications as of 07/22/2020  Medication Sig  . apixaban (ELIQUIS) 5 MG TABS tablet Take 1 tablet (5 mg total) by mouth 2 (two) times daily.  Marland Kitchen BREZTRI AEROSPHERE 160-9-4.8 MCG/ACT AERO 2 puffs daily as needed.  . ergocalciferol (VITAMIN D2) 1.25 MG (50000 UT) capsule Take 50,000 Units by mouth every Monday.  . Multiple Vitamins-Minerals (WOMENS MULTI GUMMIES PO) Take 1 tablet by mouth daily.  . NON FORMULARY Goli 1 gummie three times daily (Apple cider vinegar)  . pantoprazole (PROTONIX) 40 MG tablet Take 1 tablet (40 mg total) by mouth daily.  . [DISCONTINUED] sertraline (ZOLOFT) 50 MG tablet Take 50 mg by mouth daily.    No facility-administered encounter medications on file as of 07/22/2020.    Allergies as of 07/22/2020  . (No Known Allergies)    Past Medical History:  Diagnosis Date  . 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 Depression    "S/P losing my daughter d/t rejection post lung transplant"  . GERD (gastroesophageal reflux disease)   . Headache(784.0)     "related to starting my periods" (09/11/2013)    Past Surgical History:  Procedure Laterality Date  . CESAREAN SECTION  1993; 1997; 1998  . CHOLECYSTECTOMY  1993   w/UHR  . CYST EXCISION  1985   "end of my spine"  . GASTRIC BYPASS  1999  . HERNIA REPAIR    . INSERTION OF MESH N/A 09/11/2013   Procedure: INSERTION OF MESH;  Surgeon: Ernestene Mention, MD;  Location: [facility] OR;  Service: General;  Laterality: N/A;  . LAPAROSCOPIC INCISIONAL / UMBILICAL / VENTRAL HERNIA REPAIR  09/11/2013   VHR w/mesh and LOA   . LYSIS OF ADHESION N/A 09/11/2013   Procedure: LAPAROSCOPIC LYSIS OF ADHESION;  Surgeon: Ernestene Mention, MD;  Location: [facility] OR;  Service: General;  Laterality: N/A;  . TONSILLECTOMY  1972  . TUBAL LIGATION  1998  . UMBILICAL HERNIA REPAIR  1993   w/chole  . VENTRAL HERNIA REPAIR N/A 09/11/2013   Procedure: LAPAROSCOPIC VENTRAL HERNIA REPAIR ;  Surgeon: Ernestene Mention, MD;  Location: [facility] OR;  Service: General;  Laterality: N/A;    Family History  Problem Relation Age of Onset  . Cancer Mother        breast  . Cancer Father        prostate  . Cancer Sister        ovarian  . Cancer Paternal Uncle        prostate  . Cancer Maternal Grandmother  breast  . Cancer Paternal Grandfather        prostate  . Cancer Sister        ovarian    Social History   Socioeconomic History  . Marital status: Significant Other    Spouse name: Not on file  . Number of children: Not on file  . Years of education: Not on file  . Highest education level: Not on file  Occupational History  . Not on file  Tobacco Use  . Smoking status: Never Smoker  . Smokeless tobacco: Never Used  Vaping Use  . Vaping Use: Never used  Substance and Sexual Activity  . Alcohol use: Yes    Comment: 09/11/2013 "glass of wine/month maybe"  . Drug use: No  . Sexual activity: Yes  Other Topics Concern  . Not on file  Social History Narrative  . Not on file   Social Determinants of Health    Financial Resource Strain: Not on file  Food Insecurity: Not on file  Transportation Needs: Not on file  Physical Activity: Not on file  Stress: Not on file  Social Connections: Not on file  Intimate Partner Violence: Not on file    Review of systems: Review of Systems  Constitutional: Negative for fever and chills.  HENT: Negative.   Eyes: Negative for blurred vision.  Respiratory: as per HPI  Cardiovascular: Negative for chest pain and palpitations.  Gastrointestinal: Negative for vomiting, diarrhea, blood per rectum. Genitourinary: Negative for dysuria, urgency, frequency and hematuria.  Musculoskeletal: Negative for myalgias, back pain and joint pain.  Skin: Negative for itching and rash.  Neurological: Negative for dizziness, tremors, focal weakness, seizures and loss of consciousness.  Endo/Heme/Allergies: Negative for environmental allergies.  Psychiatric/Behavioral: Negative for depression, suicidal ideas and hallucinations.  All other systems reviewed and are negative.  Physical Exam: Blood pressure 116/74, pulse 90, temperature 97.8 F (36.6 C), temperature source Temporal, height 5\' 4"  (1.626 m), weight (!) 328 lb 9.6 oz (149.1 kg), SpO2 98 %. Gen:      No acute distress, obese HEENT:  EOMI, sclera anicteric Neck:     No masses; no thyromegaly Lungs:    Clear to auscultation bilaterally; normal respiratory effort CV:         Regular rate and rhythm; no murmurs Abd:      + bowel sounds; soft, non-tender; no palpable masses, no distension Ext:    No edema; adequate peripheral perfusion Skin:      Warm and dry; no rash Neuro: alert and oriented x 3 Psych: normal mood and affect  Data Reviewed: Imaging: CT 05/24/2020-no PE, extensive diffuse bilateral airspace opacities.  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

## 2020-07-22 NOTE — Addendum Note (Signed)
Addended by: Melonie [place] on: 07/22/2020 12:32 PM   Modules accepted: Orders

## 2020-07-23 ENCOUNTER — Other Ambulatory Visit: Payer: Self-pay | Admitting: Nurse Practitioner

## 2020-07-30 ENCOUNTER — Ambulatory Visit (INDEPENDENT_AMBULATORY_CARE_PROVIDER_SITE_OTHER)
Admission: RE | Admit: 2020-07-30 | Discharge: 2020-07-30 | Disposition: A | Discharge status: Home / Self Care | Payer: 59 | Source: Ambulatory Visit | Attending: Pulmonary Disease | Admitting: Pulmonary Disease

## 2020-07-30 ENCOUNTER — Other Ambulatory Visit: Payer: Self-pay

## 2020-07-30 DIAGNOSIS — U099 Post covid-19 condition, unspecified: Secondary | ICD-10-CM | POA: Diagnosis not present

## 2020-07-30 DIAGNOSIS — R0609 Other forms of dyspnea: Secondary | ICD-10-CM

## 2020-08-20 ENCOUNTER — Ambulatory Visit (INDEPENDENT_AMBULATORY_CARE_PROVIDER_SITE_OTHER): Payer: 59 | Admitting: Nurse Practitioner

## 2020-08-20 VITALS — BP 132/84 | HR 91 | Temp 97.9°F | Resp 18

## 2020-08-20 DIAGNOSIS — G9331 Postviral fatigue syndrome: Secondary | ICD-10-CM

## 2020-08-20 DIAGNOSIS — G933 Postviral fatigue syndrome: Secondary | ICD-10-CM | POA: Diagnosis not present

## 2020-08-20 DIAGNOSIS — U071 COVID-19: Secondary | ICD-10-CM | POA: Diagnosis not present

## 2020-08-20 DIAGNOSIS — J1282 Pneumonia due to coronavirus disease 2019: Secondary | ICD-10-CM

## 2020-08-20 DIAGNOSIS — R0683 Snoring: Secondary | ICD-10-CM | POA: Diagnosis not present

## 2020-08-20 NOTE — Patient Instructions (Addendum)
Pneumonia due to COVID-19 virus:  Stay well hydrated  Stay active  Deep breathing exercises  May take mucinex twice daily  Continue to wean O2 to keep sats above 93%   Please keep upcoming appointment with pulmonary   Elevated D-dimers, right lower extremity DVT:   -Continue Eliquis, will need 65-month of anticoagulation.  -may d/c Eliquis at the beginning of May pending upcoming [place] that will be ordered by Dr. Isaiah Serge    GERD:  -Protonix  Snoring:  Sleep study scheduled for tonight  Fatigue:  May need PT if this persists    Follow up:  Follow up in 3 months

## 2020-08-20 NOTE — Assessment & Plan Note (Signed)
Stay well hydrated  Stay active  Deep breathing exercises  May take mucinex twice daily  Continue to wean O2 to keep sats above 93%   Please keep upcoming appointment with pulmonary   Elevated D-dimers, right lower extremity DVT:   -Continue Eliquis, will need 84-month of anticoagulation.  -may d/c Eliquis at the beginning of May pending upcoming [place] that will be ordered by Dr. Isaiah Serge    GERD:  -Protonix  Snoring:  Sleep study scheduled for tonight  Fatigue:  May need PT if this persists    Follow up:  Follow up in 3 months

## 2020-08-20 NOTE — Progress Notes (Signed)
@Patient  ID: , female    DOB: 01-06-1971, 49 y.o.   MRN: [id number]  Chief Complaint  Patient presents with  . Follow-up    Improving     Referring provider: No ref. provider found   49 y.o.femalewith medical history significant ofGERD, depression, morbid obesity.  Recent Significant Events:  Hospital Course: admitted 05/24/20: Acute hypoxic respiratory failure secondary to COVID-19 pneumonia (Initially tested positive 05/16/20) -Patient with hypoxia, multifocal pneumonia on admission, treated with steroids, Remdesivir and baricitinib, hypoxia has improved, she is requiring 2 L with activity, she will be discharged on dexamethasone to finish 10 days of steroid treatment. -She was instructed to take her incentive spirometry and flutter valve at home and keep using them.  Elevated D-dimers, right lower extremity DVT -Patient with significantly elevated D-dimers,peaked at8.5,CTA with no evidence of PE, but venous Doppler significant for age-indeterminate right posterior tibial vein DVT -Lovenox during hospital stay, she will be discharged on Eliquis, will need 24-month of anticoagulation.  07/22/20: Pulmonary referral: sleep study ordered / CT ordered  HPI  Patient presents today for post-COVID care clinic visit follow-up.  Patient was last seen in our office on 06/25/2020.  At patient's last visit here she was referred to pulmonary and has followed up with them.  She was ordered a CT scan and an order was placed for a sleep study.  She is scheduled to have sleep study tonight.  She states that overall she is doing well and is improving.  She does still have extreme fatigue but states that she is having very poor quality sleep at night.  We discussed that after her sleep study we can order physical therapy if needed.  Patient is scheduled to come off of Eliquis at the beginning of May.  I have discussed this with Dr. June who will be following up with the patient next  week.  He will order repeat ultrasound to confirm if it is okay for patient to come off Eliquis at the beginning of May. Denies f/c/s, n/v/d, hemoptysis, chest pain or edema.     No Known Allergies  There is no immunization history for the selected administration types on file for this patient.  Past Medical History:  Diagnosis Date  . 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 Depression    "S/P losing my daughter d/t rejection post lung transplant"  . GERD (gastroesophageal reflux disease)   . Headache(784.0)    "related to starting my periods" (09/11/2013)    Tobacco History: Social History   Tobacco Use  Smoking Status Never Smoker  Smokeless Tobacco Never Used   Counseling given: Yes   Outpatient Encounter Medications as of 08/20/2020  Medication Sig  . apixaban (ELIQUIS) 5 MG TABS tablet Take 1 tablet (5 mg total) by mouth 2 (two) times daily.  08/22/2020 BREZTRI AEROSPHERE 160-9-4.8 MCG/ACT AERO 2 puffs daily as needed.  . ergocalciferol (VITAMIN D2) 1.25 MG (50000 UT) capsule Take 50,000 Units by mouth every Monday.  . Multiple Vitamins-Minerals (WOMENS MULTI GUMMIES PO) Take 1 tablet by mouth daily.  . NON FORMULARY Goli 1 gummie three times daily (Apple cider vinegar)  . pantoprazole (PROTONIX) 40 MG tablet TAKE 1 TABLET BY MOUTH EVERY DAY  . [DISCONTINUED] sertraline (ZOLOFT) 50 MG tablet Take 50 mg by mouth daily.    No facility-administered encounter medications on file as of 08/20/2020.     Review of Systems  Review of Systems  Constitutional: Positive for fatigue.  HENT: Negative.  Respiratory: Negative for cough and shortness of breath.   Cardiovascular: Negative.  Negative for chest pain, palpitations and leg swelling.  Gastrointestinal: Negative.   Allergic/Immunologic: Negative.   Neurological: Negative.   Psychiatric/Behavioral: Negative.        Physical Exam  BP 132/84   Pulse 91   Temp 97.9 F (36.6 C)   Resp 18   SpO2 99% Comment: RA  Wt  Readings from Last 5 Encounters:  07/22/20 (!) 328 lb 9.6 oz (149.1 kg)  06/04/20 (!) 308 lb 0.1 oz (139.7 kg)  05/24/20 (!) 306 lb 3.5 oz (138.9 kg)  08/14/15 291 lb 3.2 oz (132.1 kg)  11/09/13 275 lb 9.6 oz (125 kg)     Physical Exam Vitals and nursing note reviewed.  Constitutional:      General: She is not in acute distress.    Appearance: She is well-developed.  Cardiovascular:     Rate and Rhythm: Normal rate and regular rhythm.  Pulmonary:     Effort: Pulmonary effort is normal.     Breath sounds: Normal breath sounds.  Musculoskeletal:     Right lower leg: No edema.     Left lower leg: No edema.  Neurological:     Mental Status: She is alert and oriented to person, place, and time.       Imaging: CT Chest High Resolution  Result Date: 07/31/2020 CLINICAL DATA:  COVID pneumonia January 2022, persistent shortness of breath. EXAM: CT CHEST WITHOUT CONTRAST TECHNIQUE: Multidetector CT imaging of the chest was performed following the standard protocol without intravenous contrast. High resolution imaging of the lungs, as well as inspiratory and expiratory imaging, was performed. COMPARISON:  05/24/2020. FINDINGS: Cardiovascular: Atherosclerotic calcification of the aorta. Heart is enlarged. No pericardial effusion. Mediastinum/Nodes: High right paratracheal lymph node has decreased in size in the interval, now measuring 1.4 cm, compared to 1.9 cm on 05/24/2020. No additional pathologically enlarged mediastinal lymph nodes. Hilar regions are difficult to definitively evaluate without IV contrast. No axillary adenopathy. Distal esophageal wall thickening can be seen with gastroesophageal reflux. Lungs/Pleura: Patchy ground-glass coarsening in the lungs bilaterally with associated mild traction bronchiectasis. No subpleural reticulation or honeycombing. Associated air trapping. No pleural fluid. Airway is unremarkable. Upper Abdomen: Visualized portions of the liver, adrenal glands,  kidneys, spleen and pancreas are unremarkable. Postoperative changes in the stomach. Periportal lymph nodes measure up to 9 mm. Musculoskeletal: Degenerative changes in the spine. No worrisome lytic or sclerotic lesions. IMPRESSION: 1. 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 2. Enlarged high right paratracheal lymph node, decreased in size from 05/24/2020 and possibly reactive. Difficult to definitively exclude malignancy. 3.  Aortic atherosclerosis (ICD10-I70.0). Electronically Signed   By: Leanna Battles M.D.   On: 07/31/2020 11:07     Assessment & Plan:   Pneumonia due to COVID-19 virus Stay well hydrated  Stay active  Deep breathing exercises  May take mucinex twice daily  Continue to wean O2 to keep sats above 93%   Please keep upcoming appointment with pulmonary   Elevated D-dimers, right lower extremity DVT:   -Continue Eliquis, will need 4-month of anticoagulation.  -may d/c Eliquis at the beginning of May pending upcoming [place] that will be ordered by Dr. Isaiah Serge    GERD:  -Protonix  Snoring:  Sleep study scheduled for tonight  Fatigue:  May need PT if this persists    Follow up:  Follow up in 3 months  Ivonne Andrew, NP 08/20/2020

## 2020-08-21 ENCOUNTER — Ambulatory Visit: Payer: 59

## 2020-08-21 ENCOUNTER — Other Ambulatory Visit: Payer: Self-pay

## 2020-08-21 DIAGNOSIS — U099 Post covid-19 condition, unspecified: Secondary | ICD-10-CM

## 2020-08-21 DIAGNOSIS — G4733 Obstructive sleep apnea (adult) (pediatric): Secondary | ICD-10-CM | POA: Diagnosis not present

## 2020-08-23 ENCOUNTER — Other Ambulatory Visit (HOSPITAL_COMMUNITY)
Admission: RE | Admit: 2020-08-23 | Discharge: 2020-08-23 | Disposition: A | Discharge status: Home / Self Care | Payer: 59 | Source: Ambulatory Visit | Attending: Pulmonary Disease | Admitting: Pulmonary Disease

## 2020-08-23 DIAGNOSIS — Z20822 Contact with and (suspected) exposure to covid-19: Secondary | ICD-10-CM | POA: Insufficient documentation

## 2020-08-23 DIAGNOSIS — Z01812 Encounter for preprocedural laboratory examination: Secondary | ICD-10-CM | POA: Insufficient documentation

## 2020-08-23 DIAGNOSIS — G4733 Obstructive sleep apnea (adult) (pediatric): Secondary | ICD-10-CM | POA: Diagnosis not present

## 2020-08-23 LAB — SARS CORONAVIRUS 2 (TAT 6-24 HRS): SARS Coronavirus 2: NEGATIVE

## 2020-08-25 ENCOUNTER — Other Ambulatory Visit: Payer: Self-pay | Admitting: Nurse Practitioner

## 2020-08-26 ENCOUNTER — Other Ambulatory Visit: Payer: Self-pay

## 2020-08-26 ENCOUNTER — Other Ambulatory Visit: Payer: Self-pay | Admitting: Nurse Practitioner

## 2020-08-26 ENCOUNTER — Ambulatory Visit (INDEPENDENT_AMBULATORY_CARE_PROVIDER_SITE_OTHER): Payer: 59 | Admitting: Pulmonary Disease

## 2020-08-26 ENCOUNTER — Encounter: Payer: Self-pay | Admitting: Pulmonary Disease

## 2020-08-26 VITALS — BP 134/76 | HR 98 | Temp 98.0°F | Ht 64.0 in | Wt 323.0 lb

## 2020-08-26 DIAGNOSIS — G4733 Obstructive sleep apnea (adult) (pediatric): Secondary | ICD-10-CM

## 2020-08-26 DIAGNOSIS — U099 Post covid-19 condition, unspecified: Secondary | ICD-10-CM

## 2020-08-26 DIAGNOSIS — R0609 Other forms of dyspnea: Secondary | ICD-10-CM | POA: Diagnosis not present

## 2020-08-26 LAB — PULMONARY FUNCTION TEST
DL/VA % pred: 117 %
DL/VA: 5.07 ml/min/mmHg/L
DLCO cor % pred: 115 %
DLCO cor: 24.14 ml/min/mmHg
DLCO unc % pred: 115 %
DLCO unc: 24.14 ml/min/mmHg
FEF 25-75 Post: 4 L/sec
FEF 25-75 Pre: 3.55 L/sec
FEF2575-%Change-Post: 12 %
FEF2575-%Pred-Post: 146 %
FEF2575-%Pred-Pre: 130 %
FEV1-%Change-Post: 5 %
FEV1-%Pred-Post: 92 %
FEV1-%Pred-Pre: 88 %
FEV1-Post: 2.58 L
FEV1-Pre: 2.45 L
FEV1FVC-%Change-Post: 1 %
FEV1FVC-%Pred-Pre: 108 %
FEV6-%Change-Post: 3 %
FEV6-%Pred-Post: 85 %
FEV6-%Pred-Pre: 82 %
FEV6-Post: 2.94 L
FEV6-Pre: 2.83 L
FEV6FVC-%Pred-Post: 102 %
FEV6FVC-%Pred-Pre: 102 %
FVC-%Change-Post: 3 %
FVC-%Pred-Post: 83 %
FVC-%Pred-Pre: 80 %
FVC-Post: 2.94 L
FVC-Pre: 2.83 L
Post FEV1/FVC ratio: 88 %
Post FEV6/FVC ratio: 100 %
Pre FEV1/FVC ratio: 87 %
Pre FEV6/FVC Ratio: 100 %
RV % pred: 91 %
RV: 1.66 L
TLC % pred: 97 %
TLC: 4.91 L

## 2020-08-26 NOTE — Patient Instructions (Signed)
Full PFT performed today. °

## 2020-08-26 NOTE — Patient Instructions (Signed)
I have reviewed your lung function test which shows normal lung function The CT of the chest does show some scarring which is residual from COVID-19 which we will continue to monitor  Sleep study she has moderate sleep apnea with low oxygen levels We will schedule you for a CPAP titration Continue to work on weight loss Follow-up in 6 months.

## 2020-08-26 NOTE — Progress Notes (Signed)
Full PFT performed today. °

## 2020-08-26 NOTE — Progress Notes (Signed)
Christina Castro    [id number]    1971/02/16  Primary Care Physician:Shelton, Barbee Cough, PA-C  Referring Physician: No referring provider defined for this encounter.  Chief complaint: Follow-up for post COVID-92  HPI: 50 year old with GERD, depression, obesity Admitted in January 2022 with COVID-19 pneumonia.  Treated with remdesivir, baricitinib, steroids.  She did not have a PE but noted to have DVT for which she is on anticoagulation Discharged with 2 L oxygen and steroid for 10 days  She continues to use oxygen intermittently at night, notes daytime somnolence, fatigue and witnessed apneas.  Pets: No pets Occupation: Account specialist at a business Exposures: No mold, hot tub, Jacuzzi.  No feather pillows or comforter Smoking history: Never smoker Travel history: Originally from [place].  No significant recent travel Relevant family history: No family history of lung disease  Interval history: Continues to make slow improvements in her breathing.  She no longer need for supplemental oxygen during daytime.  Continues on oxygen at night She had recent sleep study which showed moderate sleep apnea with desats  Sprained her right leg recently and had an ultrasound of leg performed by primary care at [facility] and was told that there is no evidence of DVT.  Outpatient Encounter Medications as of 08/26/2020  Medication Sig  . BREZTRI AEROSPHERE 160-9-4.8 MCG/ACT AERO 2 puffs daily as needed.  Marland Kitchen ELIQUIS 5 MG TABS tablet TAKE 1 TABLET BY MOUTH TWICE A DAY  . ergocalciferol (VITAMIN D2) 1.25 MG (50000 UT) capsule Take 50,000 Units by mouth every Monday.  . Multiple Vitamins-Minerals (WOMENS MULTI GUMMIES PO) Take 1 tablet by mouth daily.  . NON FORMULARY Goli 1 gummie three times daily (Apple cider vinegar)  . pantoprazole (PROTONIX) 40 MG tablet TAKE 1 TABLET BY MOUTH EVERY DAY  . [DISCONTINUED] sertraline (ZOLOFT) 50 MG tablet Take 50 mg by mouth daily.    No  facility-administered encounter medications on file as of 08/26/2020.   Physical Exam: Blood pressure 134/76, pulse 98, temperature 98 F (36.7 C), temperature source Temporal, height 5\' 4"  (1.626 m), weight (!) 323 lb (146.5 kg), SpO2 97 %. Gen:      No acute distress HEENT:  EOMI, sclera anicteric Neck:     No masses; no thyromegaly Lungs:    Clear to auscultation bilaterally; normal respiratory effort CV:         Regular rate and rhythm; no murmurs Abd:      + bowel sounds; soft, non-tender; no palpable masses, no distension Ext:    No edema; adequate peripheral perfusion Skin:      Warm and dry; no rash Neuro: alert and oriented x 3 Psych: normal mood and affect  Data Reviewed: Imaging: CT 05/24/2020-no PE, extensive diffuse bilateral airspace opacities. High-res CT 07/30/2020- patchy fibrosis with mild traction bronchiectasis. I have reviewed the images personally  Leg ultrasound 05/25/2020- DVT in the right posterior tibial vein.  PFTs:  08/26/2020 FVC 2.94 [83%], FEV1 2.58 [92%], F/F 88, TLC 4.91 [97%], DLCO 24.14 [115%] Normal test  Labs:   Sleep: Home sleep study 08/21/2020 Moderate sleep apnea, AHI 20.2.  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  If confirmed that the clot is resolved then she can come off the Eliquis  Moderate sleep apnea Home sleep study reviewed with moderate sleep apnea, desats Discussed options for CPAP titration versus AutoSet CPAP.  She has chosen CPAP titration which will enable [place] to determine if she needs additional oxygen at the same time  Plan/Recommendations: Continue  breztri CPAP titration Get records on leg ultrasound from primary care  Chilton Greathouse MD [facility] 08/26/2020, 11:10 AM  CC: No ref. provider found

## 2020-08-28 ENCOUNTER — Ambulatory Visit: Payer: 59 | Admitting: Pulmonary Disease

## 2020-09-30 ENCOUNTER — Other Ambulatory Visit: Payer: Self-pay | Admitting: Pulmonary Disease

## 2020-09-30 MED ORDER — PANTOPRAZOLE SODIUM 40 MG PO TBEC: 1.0000 | DELAYED_RELEASE_TABLET | Freq: Every day | ORAL | 2 refills | Status: DC

## 2020-10-03 ENCOUNTER — Other Ambulatory Visit: Payer: Self-pay | Admitting: Gastroenterology

## 2020-10-07 ENCOUNTER — Ambulatory Visit (HOSPITAL_BASED_OUTPATIENT_CLINIC_OR_DEPARTMENT_OTHER): Payer: 59 | Attending: Pulmonary Disease | Admitting: Pulmonary Disease

## 2020-10-07 ENCOUNTER — Other Ambulatory Visit: Payer: Self-pay

## 2020-10-07 DIAGNOSIS — G4733 Obstructive sleep apnea (adult) (pediatric): Secondary | ICD-10-CM | POA: Insufficient documentation

## 2020-10-09 DIAGNOSIS — G4733 Obstructive sleep apnea (adult) (pediatric): Secondary | ICD-10-CM | POA: Diagnosis not present

## 2020-10-09 NOTE — Procedures (Signed)
    Patient Name: Christina Castro, Heeg Date: 10/07/2020 Gender: Female D.O.B: [birth year] Age (years): 46 Referring Provider: Chilton Greathouse Height (inches): 63 Interpreting Physician: Coralyn Helling MD, ABSM Weight (lbs): 324 RPSGT: Armen Pickup BMI: 57 MRN: [id number] Neck Size: 19.00  CLINICAL INFORMATION The patient is referred for a CPAP titration to treat sleep apnea.  Date of HST 08/21/20:  AHI 20.2, SpO2 low 70%.  SLEEP STUDY TECHNIQUE As per the AASM Manual for the Scoring of Sleep and Associated Events v2.3 (April 2016) with a hypopnea requiring 4% desaturations.  The channels recorded and monitored were frontal, central and occipital EEG, electrooculogram (EOG), submentalis EMG (chin), nasal and oral airflow, thoracic and abdominal wall motion, anterior tibialis EMG, snore microphone, electrocardiogram, and pulse oximetry. Continuous positive airway pressure (CPAP) was initiated at the beginning of the study and titrated to treat sleep-disordered breathing.  MEDICATIONS Medications self-administered by patient taken the night of the study : N/A  TECHNICIAN COMMENTS Comments added by technician: PT HAD ONE RESTROOM VISTED. Patient had difficulty initiating sleep. Patient was restless all through the night. Comments added by scorer: N/A  RESPIRATORY PARAMETERS Optimal PAP Pressure (cm): 18 AHI at Optimal Pressure (/hr): 0.7 Overall Minimal O2 (%): 86.0 Supine % at Optimal Pressure (%): 0 Minimal O2 at Optimal Pressure (%): 87.0   SLEEP ARCHITECTURE The study was initiated at 10:03:50 PM and ended at 4:21:55 AM.  Sleep onset time was 35.8 minutes and the sleep efficiency was 80.1%%. The total sleep time was 303 minutes.  The patient spent 4.1%% of the night in stage N1 sleep, 23.1%% in stage N2 sleep, 52.8%% in stage N3 and 20% in REM.Stage REM latency was 130.0 minutes  Wake after sleep onset was 39.3. Alpha intrusion was absent. Supine sleep was 37.95%.  CARDIAC  DATA The 2 lead EKG demonstrated sinus rhythm. The mean heart rate was 89.6 beats per minute. Other EKG findings include: None.  LEG MOVEMENT DATA The total Periodic Limb Movements of Sleep (PLMS) were 0. The PLMS index was 0.0. A PLMS index of <15 is considered normal in adults.  IMPRESSIONS - She did well with CPAP 18 cm H2O. - She did not require the use of supplemental oxygen during this study.  DIAGNOSIS - Obstructive Sleep Apnea (G47.33)  RECOMMENDATIONS - Trial of CPAP therapy on 18 cm H2O with a Small size Resmed Full Face Mask AirFit F20 mask and heated humidification. - Avoid alcohol, sedatives and other CNS depressants that may worsen sleep apnea and disrupt normal sleep architecture. - Sleep hygiene should be reviewed to assess factors that may improve sleep quality. - Weight management and regular exercise should be initiated or continued.  [Electronically signed] 10/09/2020 01:37 PM  Coralyn Helling MD, ABSM Diplomate, American Board of Sleep Medicine   NPI: [id number]

## 2020-10-11 ENCOUNTER — Telehealth: Payer: Self-pay | Admitting: Pulmonary Disease

## 2020-10-11 NOTE — Telephone Encounter (Signed)
DME order placed.  LM for Patient to call for results.       Chilton Greathouse, MD  P Lb[facility] Triage Pool; Jacquiline Doe, LPN Sleep study reviewed. Please order CPAP therapy on 18 cm H2O with a Small sizeResmed Full Face Mask AirFit F20 mask and heated humidification

## 2020-10-11 NOTE — Telephone Encounter (Signed)
Called and spoke with Patient.  HST results and recommendations given.  DME has been placed. Patient is aware of cpap back order and need to schedule OV 30 to 90 days after receiving cpap for insurance compliance.  Nothing further at this time.

## 2020-10-11 NOTE — Telephone Encounter (Signed)
Called and spoke with Patient.  HST results and recommendations given.  DME has been placed. Patient is aware of cpap back order and need to schedule OV 30 to 90 days after receiving cpap for insurance compliance.  Nothing further at this time. 

## 2020-10-11 NOTE — Progress Notes (Addendum)
ATC Patient.  LM for Patient to call for results.  DME order placed.      Chilton Greathouse, MD  P Lb[facility] Triage Pool; Jacquiline Doe, LPN  Sleep study reviewed. Please order CPAP therapy on 18 cm H2O with a Small sizeResmed Full Face Mask AirFit F20 mask and heated humidification

## 2020-10-11 NOTE — Addendum Note (Signed)
Addended by: Jacquiline Doe on: 10/11/2020 09:18 AM   Modules accepted: Orders

## 2020-10-16 DIAGNOSIS — K469 Unspecified abdominal hernia without obstruction or gangrene: Secondary | ICD-10-CM | POA: Insufficient documentation

## 2020-10-16 DIAGNOSIS — N959 Unspecified menopausal and perimenopausal disorder: Secondary | ICD-10-CM | POA: Insufficient documentation

## 2020-10-16 DIAGNOSIS — F32A Depression, unspecified: Secondary | ICD-10-CM | POA: Insufficient documentation

## 2020-11-19 ENCOUNTER — Other Ambulatory Visit: Payer: Self-pay

## 2020-11-19 ENCOUNTER — Ambulatory Visit (INDEPENDENT_AMBULATORY_CARE_PROVIDER_SITE_OTHER): Payer: 59 | Admitting: Nurse Practitioner

## 2020-11-19 VITALS — BP 112/82 | HR 99 | Temp 97.9°F | Resp 16

## 2020-11-19 DIAGNOSIS — Z8616 Personal history of COVID-19: Secondary | ICD-10-CM

## 2020-11-19 NOTE — Patient Instructions (Addendum)
Pneumonia due to COVID-19 virus:   Stay well hydrated   Stay active   Deep breathing exercises   May take mucinex  twice daily     Continue to follow with pulmonary      GERD:   -Protonix   Sleep apnea:   Awaiting CPAP   Fatigue:   Continue exercises     Follow up:   Follow up in 3 months

## 2020-11-19 NOTE — Progress Notes (Signed)
@Patient  ID: , female    DOB: November 19, 1970, 50 y.o.   MRN: [id number]  Chief Complaint  Patient presents with   Follow-up    Referring provider: No ref. provider found  HPI  Patient presents today for post-COVID care clinic visit.  Since her last visit here on 08/20/2020 she states that she is much improved.  Patient has followed with pulmonary.  Eliquis and oxygen have both been DC'd.  Patient has been following with weight management.  She has been working on her diet and is in an exercise program.  She has lost over 20 pounds in the past month.  She was diagnosed with sleep apnea and is awaiting CPAP.  Overall patient is much improved. Denies f/c/s, n/v/d, hemoptysis, PND, chest pain or edema.    No Known Allergies  There is no immunization history for the selected administration types on file for this patient.  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   ELIQUIS 5 MG TABS tablet TAKE 1 TABLET BY MOUTH TWICE A DAY   ergocalciferol (VITAMIN D2) 1.25 MG (50000 UT) capsule Take 50,000 Units by mouth every Monday.   Multiple Vitamins-Minerals (WOMENS MULTI GUMMIES PO) Take 1 tablet by mouth daily.   NON FORMULARY Goli 1 gummie three times daily (Apple cider vinegar)   pantoprazole (PROTONIX) 40 MG tablet Take 1 tablet (40 mg total) by mouth daily.   [DISCONTINUED] sertraline (ZOLOFT) 50 MG tablet Take 50 mg by mouth daily.    No facility-administered encounter medications on file as of 11/19/2020.      Review of Systems  Review of Systems  Constitutional: Negative.  Negative for fatigue and fever.  HENT: Negative.    Respiratory:  Negative for cough and shortness of breath.   Cardiovascular: Negative.   Gastrointestinal: Negative.   Allergic/Immunologic: Negative.   Neurological: Negative.   Psychiatric/Behavioral: Negative.        Physical Exam  BP 112/82   Pulse 99   Temp 97.9 F (36.6 C)   Resp 16   SpO2 100%   Wt Readings from Last 5 Encounters:  10/07/20 (!) 324 lb (147 kg)  08/26/20 (!) 323 lb (146.5 kg)  07/22/20 (!) 328 lb 9.6 oz (149.1 kg)  06/04/20 (!) 308 lb 0.1 oz (139.7 kg)  05/24/20 (!) 306 lb 3.5 oz (138.9 kg)     Physical Exam Vitals and nursing note reviewed.  Constitutional:      General: She is not in acute distress.    Appearance: She is well-developed.  Cardiovascular:     Rate and Rhythm: Normal rate and regular rhythm.  Pulmonary:     Effort: Pulmonary effort is normal.     Breath sounds: Normal breath sounds.  Musculoskeletal:     Right lower leg: No edema.     Left lower leg: No edema.  Neurological:     Mental Status: She is alert and oriented to person, place, and time.  Assessment & Plan:   History of COVID-19 Stay well hydrated   Stay active   Deep breathing exercises   May take mucinex  twice daily     Continue to follow with pulmonary      GERD:   -Protonix   Sleep apnea:   Awaiting CPAP   Fatigue:   Continue exercises     Follow up:   Follow up in 3 months     Ivonne Andrew, NP 11/19/2020

## 2020-11-19 NOTE — Assessment & Plan Note (Signed)
Stay well hydrated   Stay active   Deep breathing exercises   May take mucinex  twice daily     Continue to follow with pulmonary      GERD:   -Protonix   Sleep apnea:   Awaiting CPAP   Fatigue:   Continue exercises     Follow up:   Follow up in 3 months

## 2020-12-11 ENCOUNTER — Emergency Department (HOSPITAL_COMMUNITY): Payer: 59

## 2020-12-11 ENCOUNTER — Other Ambulatory Visit: Payer: Self-pay

## 2020-12-11 ENCOUNTER — Encounter (HOSPITAL_COMMUNITY): Payer: Self-pay | Admitting: *Deleted

## 2020-12-11 ENCOUNTER — Inpatient Hospital Stay (HOSPITAL_COMMUNITY)
Admission: EM | Admit: 2020-12-11 | Discharge: 2020-12-15 | DRG: 872 | Disposition: A | Discharge status: Home / Self Care | Payer: 59 | Attending: Internal Medicine | Admitting: Internal Medicine

## 2020-12-11 DIAGNOSIS — Z87440 Personal history of urinary (tract) infections: Secondary | ICD-10-CM | POA: Diagnosis not present

## 2020-12-11 DIAGNOSIS — N390 Urinary tract infection, site not specified: Secondary | ICD-10-CM | POA: Diagnosis present

## 2020-12-11 DIAGNOSIS — Z6841 Body Mass Index (BMI) 40.0 and over, adult: Secondary | ICD-10-CM

## 2020-12-11 DIAGNOSIS — A419 Sepsis, unspecified organism: Secondary | ICD-10-CM

## 2020-12-11 DIAGNOSIS — Z9884 Bariatric surgery status: Secondary | ICD-10-CM

## 2020-12-11 DIAGNOSIS — A4151 Sepsis due to Escherichia coli [E. coli]: Principal | ICD-10-CM | POA: Diagnosis present

## 2020-12-11 DIAGNOSIS — Z8616 Personal history of COVID-19: Secondary | ICD-10-CM

## 2020-12-11 DIAGNOSIS — K219 Gastro-esophageal reflux disease without esophagitis: Secondary | ICD-10-CM | POA: Diagnosis present

## 2020-12-11 DIAGNOSIS — Z7901 Long term (current) use of anticoagulants: Secondary | ICD-10-CM

## 2020-12-11 DIAGNOSIS — Z79899 Other long term (current) drug therapy: Secondary | ICD-10-CM

## 2020-12-11 DIAGNOSIS — N10 Acute tubulo-interstitial nephritis: Secondary | ICD-10-CM | POA: Diagnosis present

## 2020-12-11 DIAGNOSIS — R7881 Bacteremia: Secondary | ICD-10-CM | POA: Diagnosis not present

## 2020-12-11 DIAGNOSIS — F32A Depression, unspecified: Secondary | ICD-10-CM | POA: Diagnosis present

## 2020-12-11 DIAGNOSIS — B9620 Unspecified Escherichia coli [E. coli] as the cause of diseases classified elsewhere: Secondary | ICD-10-CM | POA: Diagnosis not present

## 2020-12-11 DIAGNOSIS — D72829 Elevated white blood cell count, unspecified: Secondary | ICD-10-CM

## 2020-12-11 DIAGNOSIS — R600 Localized edema: Secondary | ICD-10-CM

## 2020-12-11 DIAGNOSIS — R6520 Severe sepsis without septic shock: Secondary | ICD-10-CM | POA: Diagnosis present

## 2020-12-11 DIAGNOSIS — I509 Heart failure, unspecified: Secondary | ICD-10-CM | POA: Diagnosis present

## 2020-12-11 DIAGNOSIS — Z86718 Personal history of other venous thrombosis and embolism: Secondary | ICD-10-CM | POA: Diagnosis not present

## 2020-12-11 DIAGNOSIS — N12 Tubulo-interstitial nephritis, not specified as acute or chronic: Secondary | ICD-10-CM | POA: Diagnosis not present

## 2020-12-11 DIAGNOSIS — Z20822 Contact with and (suspected) exposure to covid-19: Secondary | ICD-10-CM | POA: Diagnosis present

## 2020-12-11 DIAGNOSIS — E872 Acidosis: Secondary | ICD-10-CM | POA: Diagnosis present

## 2020-12-11 LAB — RESP PANEL BY RT-PCR (FLU A&B, COVID) ARPGX2
Influenza A by PCR: NEGATIVE
Influenza B by PCR: NEGATIVE
SARS Coronavirus 2 by RT PCR: NEGATIVE

## 2020-12-11 LAB — CBC WITH DIFFERENTIAL/PLATELET
Abs Immature Granulocytes: 0.13 10*3/uL — ABNORMAL HIGH (ref 0.00–0.07)
Basophils Absolute: 0.2 10*3/uL — ABNORMAL HIGH (ref 0.0–0.1)
Basophils Relative: 1 %
Eosinophils Absolute: 0 10*3/uL (ref 0.0–0.5)
Eosinophils Relative: 0 %
HCT: 46.2 % — ABNORMAL HIGH (ref 36.0–46.0)
Hemoglobin: 15.1 g/dL — ABNORMAL HIGH (ref 12.0–15.0)
Immature Granulocytes: 1 %
Lymphocytes Relative: 6 %
Lymphs Abs: 1.5 10*3/uL (ref 0.7–4.0)
MCH: 29.5 pg (ref 26.0–34.0)
MCHC: 32.7 g/dL (ref 30.0–36.0)
MCV: 90.4 fL (ref 80.0–100.0)
Monocytes Absolute: 2.2 10*3/uL — ABNORMAL HIGH (ref 0.1–1.0)
Monocytes Relative: 9 %
Neutro Abs: 20.3 10*3/uL — ABNORMAL HIGH (ref 1.7–7.7)
Neutrophils Relative %: 83 %
Platelets: 371 10*3/uL (ref 150–400)
RBC: 5.11 MIL/uL (ref 3.87–5.11)
RDW: 15.2 % (ref 11.5–15.5)
WBC: 24.2 10*3/uL — ABNORMAL HIGH (ref 4.0–10.5)
nRBC: 0 % (ref 0.0–0.2)

## 2020-12-11 LAB — COMPREHENSIVE METABOLIC PANEL
ALT: 19 U/L (ref 0–44)
AST: 23 U/L (ref 15–41)
Albumin: 3.5 g/dL (ref 3.5–5.0)
Alkaline Phosphatase: 61 U/L (ref 38–126)
Anion gap: 9 (ref 5–15)
BUN: 9 mg/dL (ref 6–20)
CO2: 21 mmol/L — ABNORMAL LOW (ref 22–32)
Calcium: 9.2 mg/dL (ref 8.9–10.3)
Chloride: 105 mmol/L (ref 98–111)
Creatinine, Ser: 0.95 mg/dL (ref 0.44–1.00)
GFR, Estimated: >60 mL/min (ref >60)
Glucose, Bld: 148 mg/dL — ABNORMAL HIGH (ref 70–99)
Potassium: 4 mmol/L (ref 3.5–5.1)
Sodium: 135 mmol/L (ref 135–145)
Total Bilirubin: 1.3 mg/dL — ABNORMAL HIGH (ref 0.3–1.2)
Total Protein: 6.9 g/dL (ref 6.5–8.1)

## 2020-12-11 LAB — URINALYSIS, MICROSCOPIC (REFLEX): WBC, UA: >50 WBC/hpf (ref 0–5)

## 2020-12-11 LAB — URINALYSIS, ROUTINE W REFLEX MICROSCOPIC
Bilirubin Urine: NEGATIVE
Glucose, UA: NEGATIVE mg/dL
Ketones, ur: NEGATIVE mg/dL
Nitrite: NEGATIVE
Protein, ur: NEGATIVE mg/dL
Specific Gravity, Urine: <1.005 — ABNORMAL LOW (ref 1.005–1.030)
pH: 6 (ref 5.0–8.0)

## 2020-12-11 LAB — LACTIC ACID, PLASMA
Lactic Acid, Venous: 2.5 mmol/L (ref 0.5–1.9)
Lactic Acid, Venous: 2.3 mmol/L (ref 0.5–1.9)

## 2020-12-11 LAB — PROTIME-INR
INR: 1.1 (ref 0.8–1.2)
Prothrombin Time: 14.1 seconds (ref 11.4–15.2)

## 2020-12-11 MED ORDER — ONDANSETRON HCL 4 MG/2ML IJ SOLN: 4.0000 mg | Freq: Four times a day (QID) | INTRAMUSCULAR | Status: DC | PRN

## 2020-12-11 MED ORDER — PANTOPRAZOLE SODIUM 40 MG PO TBEC
40.0000 mg | DELAYED_RELEASE_TABLET | Freq: Every day | ORAL | Status: DC
  Administered 2020-12-11 – 2020-12-15 (×5): 40 mg via ORAL
  Filled 2020-12-11 (×5): qty 1

## 2020-12-11 MED ORDER — ONDANSETRON HCL 4 MG PO TABS: 4.0000 mg | ORAL_TABLET | Freq: Four times a day (QID) | ORAL | Status: DC | PRN

## 2020-12-11 MED ORDER — ACETAMINOPHEN 650 MG RE SUPP: 650.0000 mg | Freq: Four times a day (QID) | RECTAL | Status: DC | PRN

## 2020-12-11 MED ORDER — ACETAMINOPHEN 325 MG PO TABS
650.0000 mg | ORAL_TABLET | Freq: Four times a day (QID) | ORAL | Status: DC | PRN
  Administered 2020-12-11 – 2020-12-14 (×6): 650 mg via ORAL
  Filled 2020-12-11 (×8): qty 2

## 2020-12-11 MED ORDER — ACETAMINOPHEN 325 MG PO TABS
650.0000 mg | ORAL_TABLET | Freq: Once | ORAL | Status: AC | PRN
  Administered 2020-12-11: 650 mg via ORAL
  Filled 2020-12-11: qty 2

## 2020-12-11 MED ORDER — ENOXAPARIN SODIUM 40 MG/0.4ML IJ SOSY
40.0000 mg | PREFILLED_SYRINGE | INTRAMUSCULAR | Status: DC
  Administered 2020-12-11 – 2020-12-14 (×4): 40 mg via SUBCUTANEOUS
  Filled 2020-12-11 (×3): qty 0.4

## 2020-12-11 MED ORDER — LACTATED RINGERS IV SOLN: INTRAVENOUS | Status: DC

## 2020-12-11 MED ORDER — SENNOSIDES-DOCUSATE SODIUM 8.6-50 MG PO TABS: 1.0000 | ORAL_TABLET | Freq: Every evening | ORAL | Status: DC | PRN

## 2020-12-11 MED ORDER — IBUPROFEN 400 MG PO TABS
600.0000 mg | ORAL_TABLET | Freq: Once | ORAL | Status: AC
  Administered 2020-12-11: 600 mg via ORAL
  Filled 2020-12-11: qty 1

## 2020-12-11 MED ORDER — FENTANYL CITRATE PF 50 MCG/ML IJ SOSY
25.0000 ug | PREFILLED_SYRINGE | Freq: Once | INTRAMUSCULAR | Status: AC
Start: 2020-12-11 — End: 2020-12-11
  Administered 2020-12-11: 25 ug via INTRAVENOUS
  Filled 2020-12-11: qty 1

## 2020-12-11 MED ORDER — CYCLOBENZAPRINE HCL 10 MG PO TABS
10.0000 mg | ORAL_TABLET | Freq: Every day | ORAL | Status: DC
  Administered 2020-12-11 – 2020-12-15 (×5): 10 mg via ORAL
  Filled 2020-12-11 (×5): qty 1

## 2020-12-11 MED ORDER — FUROSEMIDE 20 MG PO TABS
40.0000 mg | ORAL_TABLET | Freq: Two times a day (BID) | ORAL | Status: DC
  Administered 2020-12-11 – 2020-12-12 (×2): 40 mg via ORAL
  Filled 2020-12-11 (×2): qty 2

## 2020-12-11 MED ORDER — IOHEXOL 350 MG/ML SOLN
100.0000 mL | Freq: Once | INTRAVENOUS | Status: AC | PRN
  Administered 2020-12-11: 100 mL via INTRAVENOUS

## 2020-12-11 MED ORDER — SODIUM CHLORIDE 0.9 % IV SOLN
1.0000 g | INTRAVENOUS | Status: DC
  Administered 2020-12-11: 1 g via INTRAVENOUS
  Filled 2020-12-11: qty 10

## 2020-12-11 MED ORDER — SODIUM CHLORIDE 0.9 % IV BOLUS
500.0000 mL | Freq: Once | INTRAVENOUS | Status: AC
  Administered 2020-12-11: 500 mL via INTRAVENOUS

## 2020-12-11 MED ORDER — SODIUM CHLORIDE 0.9 % IV SOLN
1.0000 g | Freq: Once | INTRAVENOUS | Status: AC
  Administered 2020-12-11: 1 g via INTRAVENOUS
  Filled 2020-12-11: qty 10

## 2020-12-11 MED ORDER — SODIUM CHLORIDE 0.9 % IV BOLUS
1000.0000 mL | Freq: Once | INTRAVENOUS | Status: AC
  Administered 2020-12-11: 1000 mL via INTRAVENOUS

## 2020-12-11 NOTE — ED Triage Notes (Signed)
Pt states L sided abd pain and chills since 2 am.  Temp here 100.9.  Denies nausea or changes in bowel or bladder habits.  Denies sob or cough.

## 2020-12-11 NOTE — H&P (Signed)
History and Physical    Christina Castro BMZ:[id number] DOB: [birth year] DOA: 12/11/2020  PCP: Christia Reading, PA-C   Patient coming from: Home  Chief Complaint: fever, abdominal pain  HPI: Christina Castro is a 50 y.o. female with medical history significant for GERD, s/p Gastric bypass surgery, anemia who presents with abdominal pain that began yesterday. Reports that this am started to have fever and chills. She developed nausea and had one episode of vomiting this am at home. Has not had vomiting in the ER today. She reports pain is in left lower abdomen and left flank region. Has never had pain like this in the past she states. Pain does not radiate. She denies history of kidney stones. She has drank cranberry juice at home for past few days as she was concerned about a UTI.  She has used Tylenol at home for fever.  Not had any cough, chest pain, palpitations, shortness of breath.  She denies any vaginal discharge or bleeding.  He reports vomiting was nonbloody at home. Did have COVID in February of this year Lives with her daughter.  Has tobacco or illicit drug use.  Reports she drinks alcohol socially once every month or two.   ED Course: In the ER she has been hemodynamically stable with no low blood pressure.  She was given IV fluids and Rocephin. CT of her abdomen and pelvis showed left-sided pyelonephritis.  WBCs were 24,200, hemoglobin 15.1, hematocrit 46.2, platelets 371,000.  Lactic acid was 2.5 initially and decreased to 2.3 after some fluid hydration.  Sodium is 135, potassium 4.0, chloride 105, bicarb 21, creatinine 0.95, calcium 9.2, BUN 9, glucose 148, LFTs normal.  Urinalysis positive for leukocytosis.  Chest x-ray negative for infiltrate or consolidation.  Hospitalist service asked to admit for further management  Review of Systems:  General: Reports fever, chills. Denies weight loss, night sweats.  Denies dizziness.  Denies change in appetite HENT: Denies head trauma, headache,  denies change in hearing, tinnitus. Denies nasal congestion. Denies sore throat.  Denies difficulty swallowing Eyes: Denies blurry vision, pain in eye, drainage.  Denies discoloration of eyes. Neck: Denies pain.  Denies swelling.  Denies pain with movement. Cardiovascular: Denies chest pain, palpitations Denies orthopnea Respiratory: Denies shortness of breath, cough.  Denies wheezing.  Denies sputum production Gastrointestinal: Reports lower abdominal pain. Reports nausea but no vomiting, diarrhea.  Denies melena.  Denies hematemesis. Musculoskeletal: Denies limitation of movement.  Denies deformity. Denies arthralgias or myalgias. Genitourinary: Reports urinary frequency. Denies pelvic pain.  Denies urinary hesitancy.  Denies dysuria.  Skin: Denies rash.  Denies petechiae, purpura, ecchymosis. Neurological: Denies syncope. Denies seizure activity. Denies paresthesia. Denies slurred speech, drooping face.  Denies visual change. Psychiatric: Denies depression, anxiety.  Denies hallucinations.  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    Depression    "S/P losing my daughter d/t rejection post lung transplant"   GERD (gastroesophageal reflux disease)    Headache(784.0)    "related to starting my periods" (09/11/2013)    Past Surgical History:  Procedure Laterality Date   CESAREAN SECTION  1993; 1997; 1998   CHOLECYSTECTOMY  1993   w/UHR   CYST EXCISION  1985   "end of my spine"   GASTRIC BYPASS  1999   HERNIA REPAIR     INSERTION OF MESH N/A 09/11/2013   Procedure: INSERTION OF MESH;  Surgeon: Ernestene Mention, MD;  Location: [facility] OR;  Service: General;  Laterality: N/A;  LAPAROSCOPIC INCISIONAL / UMBILICAL / VENTRAL HERNIA REPAIR  09/11/2013   VHR w/mesh and LOA    LYSIS OF ADHESION N/A 09/11/2013   Procedure: LAPAROSCOPIC LYSIS OF ADHESION;  Surgeon: Ernestene MentionHaywood M Ingram, MD;  Location: [facility] OR;  Service: General;  Laterality: N/A;   TONSILLECTOMY  1972    TUBAL LIGATION  1998   UMBILICAL HERNIA REPAIR  1993   w/chole   VENTRAL HERNIA REPAIR N/A 09/11/2013   Procedure: LAPAROSCOPIC VENTRAL HERNIA REPAIR ;  Surgeon: Ernestene MentionHaywood M Ingram, MD;  Location: [facility] OR;  Service: General;  Laterality: N/A;    Social History  reports that she has never smoked. She has never used smokeless tobacco. She reports current alcohol use. She reports that she does not use drugs.  No Known Allergies  Family History  Problem Relation Age of Onset   Cancer Mother        breast   Cancer Father        prostate   Cancer Sister        ovarian   Cancer Paternal Uncle        prostate   Cancer Maternal Grandmother        breast   Cancer Paternal Grandfather        prostate   Cancer Sister        ovarian     Prior to Admission medications   Medication Sig Start Date End Date Taking? Authorizing Provider  CHLOROPHYLL PO Take 15 mLs by mouth daily. With water.   Yes [provider]  CINNAMON PO Take 1 tablet by mouth 2 (two) times daily.   Yes [provider]  cyclobenzaprine (FLEXERIL) 10 MG tablet Take 10 mg by mouth daily. 09/13/20  Yes [provider]  ergocalciferol (VITAMIN D2) 1.25 MG (50000 UT) capsule Take 50,000 Units by mouth every Monday.   Yes [provider]  furosemide (LASIX) 40 MG tablet Take 40 mg by mouth 2 (two) times daily. 12/07/20  Yes [provider]  Ginger, Zingiber officinalis, (GINGER ROOT PO) Take 1 tablet by mouth 2 (two) times daily.   Yes [provider]  Multiple Vitamins-Minerals (WOMENS MULTI GUMMIES PO) Take 1 tablet by mouth daily.   Yes [provider]  pantoprazole (PROTONIX) 40 MG tablet Take 1 tablet (40 mg total) by mouth daily. 09/30/20  Yes Mannam, Praveen, MD  ELIQUIS 5 MG TABS tablet TAKE 1 TABLET BY MOUTH TWICE A DAY Patient not taking: No sig reported 08/26/20   Mannam, Colbert CoyerPraveen, MD  sertraline (ZOLOFT) 50 MG tablet Take 50 mg by mouth daily.  07/04/13 04/10/20   [provider]    Physical Exam: Vitals:   12/11/20 1704 12/11/20 1738 12/11/20 1745 12/11/20 1800  BP: (!) 149/85 136/70 117/64 133/74  Pulse: (!) 102 (!) 107 (!) 108 (!) 107  Resp: 18 (!) 21 (!) 26 (!) 26  Temp: 98.9 F (37.2 C)     TempSrc: Oral     SpO2: 94% 96% 99% 98%  Weight:      Height:        Constitutional: Ill appearing. Vitals:   12/11/20 1704 12/11/20 1738 12/11/20 1745 12/11/20 1800  BP: (!) 149/85 136/70 117/64 133/74  Pulse: (!) 102 (!) 107 (!) 108 (!) 107  Resp: 18 (!) 21 (!) 26 (!) 26  Temp: 98.9 F (37.2 C)     TempSrc: Oral     SpO2: 94% 96% 99% 98%  Weight:  Height:       General: WDWN, Alert and oriented x3.  Eyes: EOMI, PERRL, conjunctivae normal.  Sclera nonicteric HENT:  [place]/AT, external ears normal.  Nares patent without epistasis.  Mucous membranes are moist. Posterior pharynx clear Neck: Soft, normal range of motion, supple, no masses,Trachea midline Respiratory: clear to auscultation bilaterally, no wheezing, no crackles. Normal respiratory effort. No accessory muscle use.  Cardiovascular: Regular rhythm with tachycardia. no murmurs / rubs / gallops. Has chronic +1 lower extremity edema. 2+ pedal pulses.  Abdomen: Soft, Left lower abdomen tenderness, nondistended, no rebound or guarding. Left CVA tenderness to percussion. No masses palpated. Morbidly obese. Bowel sounds normoactive Musculoskeletal: FROM. no cyanosis. No joint deformity upper and lower extremities. Normal muscle tone.  Skin: Warm, dry, intact no rashes, lesions, ulcers. No induration Neurologic: CN 2-12 grossly intact.  Normal speech.  Sensation intact to touch. Strength 5/5 in all extremities.   Psychiatric: Normal judgment and insight.  Normal mood.    Labs on Admission: I have personally reviewed following labs and imaging studies  CBC: Recent Labs  Lab 12/11/20 0926  WBC 24.2*  NEUTROABS 20.3*  HGB 15.1*  HCT 46.2*  MCV 90.4  PLT 371    Basic  Metabolic Panel: Recent Labs  Lab 12/11/20 0926  NA 135  K 4.0  CL 105  CO2 21*  GLUCOSE 148*  BUN 9  CREATININE 0.95  CALCIUM 9.2    GFR: Estimated Creatinine Clearance: 93.7 mL/min (by C-G formula based on SCr of 0.95 mg/dL).  Liver Function Tests: Recent Labs  Lab 12/11/20 0926  AST 23  ALT 19  ALKPHOS 61  BILITOT 1.3*  PROT 6.9  ALBUMIN 3.5    Urine analysis:    Component Value Date/Time   COLORURINE YELLOW 12/11/2020 0926   APPEARANCEUR HAZY (A) 12/11/2020 0926   LABSPEC <1.005 (L) 12/11/2020 0926   PHURINE 6.0 12/11/2020 0926   GLUCOSEU NEGATIVE 12/11/2020 0926   HGBUR TRACE (A) 12/11/2020 0926   BILIRUBINUR NEGATIVE 12/11/2020 0926   KETONESUR NEGATIVE 12/11/2020 0926   PROTEINUR NEGATIVE 12/11/2020 0926   NITRITE NEGATIVE 12/11/2020 0926   LEUKOCYTESUR LARGE (A) 12/11/2020 0926    Radiological Exams on Admission: DG Chest 2 View  Result Date: 12/11/2020 CLINICAL DATA:  Fever.  Possible sepsis. EXAM: CHEST - 2 VIEW COMPARISON:  CT chest dated July 30, 2020. Chest x-ray dated June 25, 2020. FINDINGS: The heart size and mediastinal contours are within normal limits. Normal pulmonary vascularity. Mild interstitial coarsening at the lung bases is unchanged. No focal consolidation, pleural effusion, or pneumothorax. No acute osseous abnormality. IMPRESSION: 1. No acute cardiopulmonary disease. Electronically Signed   By: Obie Dredge M.D.   On: 12/11/2020 10:56   CT ABDOMEN PELVIS W CONTRAST  Result Date: 12/11/2020 CLINICAL DATA:  Abdominal pain, fever EXAM: CT ABDOMEN AND PELVIS WITH CONTRAST TECHNIQUE: Multidetector CT imaging of the abdomen and pelvis was performed using the standard protocol following bolus administration of intravenous contrast. CONTRAST:  OMNIPAQUE IOHEXOL 350 MG/ML SOLN COMPARISON:  07/06/2013 FINDINGS: Lower chest: Lung bases are clear. No effusions. Heart is normal size. Hepatobiliary: No focal liver abnormality is seen.  Status post cholecystectomy. No biliary dilatation. Pancreas: No focal abnormality or ductal dilatation. Spleen: No focal abnormality.  Normal size. Adrenals/Urinary Tract: Decreased enhancement throughout the left kidney with stranding around the left kidney. There appears to be enhancement within the wall of the renal pelvis and ureter. Findings concerning for possible pyelonephritis. No hydronephrosis. Right kidney  and adrenal glands unremarkable. Urinary bladder grossly unremarkable. Stomach/Bowel: Stomach, large and small bowel grossly unremarkable. Vascular/Lymphatic: No evidence of aneurysm or adenopathy. Reproductive: Probable small mass. Fibroids in the uterus. No adnexal Other: No free fluid or free air. Musculoskeletal: No acute bony abnormality. IMPRESSION: Decreased enhancement throughout the left kidney with perinephric stranding. Findings concerning for left pyelonephritis. Electronically Signed   By: Charlett Nose M.D.   On: 12/11/2020 18:58      Assessment/Plan Principal Problem:   Pyelonephritis Ms. Curless is admitted to MedSurg floor Cultures were obtained in the emergency room and will be monitored.  Antibiotic will be adjusted if indicated by culture results. Patient started on Rocephfin for antibiotic coverage IVF hydration with LR at 125 ml/hr overnight.  CT scan showed left perinephric stranding consistent with pyelonephritis.  Active Problems:   Leukocytosis Elevated WBC of 24,000. Recheck CBC in am    Sepsis  Meets sepsis criteria with fever, leukocytosis, tachycardia.  She is hemodynamically stable and not in severe sepsis or requiring pressor therapy and no acute organ dysfunction.    Bilateral leg edema Patient has chronic edema of her legs and uses Lasix twice a day she reports.  Continue Lasix and monitor fluid status    Morbid obesity Chronic.  Follow with PCP for dietary, lifestyle, pharmacotherapy interventions for long-term weight loss     DVT  prophylaxis: Lovenox for DVT prophylaxis.   Code Status:   Full Code  Family Communication:  Diagnosis and plan discussed with patient who verbalized understanding.  Questions answered.  She verbalizes agreement with plan further recommendations to follow as clinically indicated Disposition Plan:   Patient is from:  Home  Anticipated DC to:  Home  Anticipated DC date:  Anticipate 2 midnight or more stay in the hospital  Anticipated DC barriers: No barriers to discharge identified at this time  Admission status:  Inpatient   Claudean Severance Laneah Luft MD [facility]  How to contact the [facility] Attending or Consulting provider 7A - 7P or covering provider during after hours 7P -7A, for this patient?   Check the care team in [facility] and look for a) attending/consulting [facility] provider listed and b) the [facility] team listed Log into [email or web address] and use [facility]'s universal password to access. If you do not have the password, please contact the hospital operator. Locate the [facility] provider you are looking for under [facility] and page to a number that you can be directly reached. If you still have difficulty reaching the provider, please page the [facility] (Director on Call) for the Hospitalists listed on amion for assistance.  12/11/2020, 7:33 PM

## 2020-12-11 NOTE — ED Notes (Signed)
Patient transported to CT 

## 2020-12-11 NOTE — ED Notes (Signed)
PA khatri notified of lactic of 2.5

## 2020-12-11 NOTE — ED Notes (Signed)
Pt ambulated to the bathroom to void.  Pt has suddenly felt hot and was drenched in sweat.  Changed pt sheets for comfort due to bed being wet.  New gown given.  Pt temperature rechecked and she is afebrile.

## 2020-12-11 NOTE — ED Provider Notes (Addendum)
[facility] EMERGENCY DEPARTMENT Provider Note   CSN: [id number] Arrival date & time: 12/11/20  6010     History Chief Complaint  Patient presents with   Abdominal Pain    Christina Castro is a 50 y.o. female with a past medical history of GERD, anemia, status postcholecystectomy, gastric bypass surgery presenting to the ED with a chief complaint of abdominal pain.  Symptoms began yesterday.  She had an episode of nonbloody, nonbilious emesis and diarrhea.  She began having pain in her left upper quadrant which persisted today.  A few days ago noticed that she had UTI symptoms but drank some cranberry juice with some improvement.  She does report history of UTIs and kidney infection.  She denies any cough, shortness of breath, documented fever at home, chest pain or back pain.  No pelvic or vaginal complaints.  HPI     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    Depression    "S/P losing my daughter d/t rejection post lung transplant"   GERD (gastroesophageal reflux disease)    Headache(784.0)    "related to starting my periods" (09/11/2013)    Patient Active Problem List   Diagnosis Date Noted   History of COVID-19 11/19/2020   Snoring 06/25/2020   Pneumonia due to COVID-19 virus 05/24/2020   Severe sepsis (HCC) 05/24/2020   Acute hypoxemic respiratory failure (HCC) 05/24/2020   Diarrhea 05/24/2020   Incisional hernia with obstruction 09/11/2013   Incisional hernia, without obstruction or gangrene 06/27/2013   Morbid obesity (HCC) 06/27/2013    Past Surgical History:  Procedure Laterality Date   CESAREAN SECTION  1993; 1997; 1998   CHOLECYSTECTOMY  1993   w/UHR   CYST EXCISION  1985   "end of my spine"   GASTRIC BYPASS  1999   HERNIA REPAIR     INSERTION OF MESH N/A 09/11/2013   Procedure: INSERTION OF MESH;  Surgeon: Ernestene Mention, MD;  Location: [facility] OR;  Service: General;  Laterality: N/A;   LAPAROSCOPIC INCISIONAL  / UMBILICAL / VENTRAL HERNIA REPAIR  09/11/2013   VHR w/mesh and LOA    LYSIS OF ADHESION N/A 09/11/2013   Procedure: LAPAROSCOPIC LYSIS OF ADHESION;  Surgeon: Ernestene Mention, MD;  Location: [facility] OR;  Service: General;  Laterality: N/A;   TONSILLECTOMY  1972   TUBAL LIGATION  1998   UMBILICAL HERNIA REPAIR  1993   w/chole   VENTRAL HERNIA REPAIR N/A 09/11/2013   Procedure: LAPAROSCOPIC VENTRAL HERNIA REPAIR ;  Surgeon: Ernestene Mention, MD;  Location: [facility] OR;  Service: General;  Laterality: N/A;     OB History   No obstetric history on file.     Family History  Problem Relation Age of Onset   Cancer Mother        breast   Cancer Father        prostate   Cancer Sister        ovarian   Cancer Paternal Uncle        prostate   Cancer Maternal Grandmother        breast   Cancer Paternal Grandfather        prostate   Cancer Sister        ovarian    Social History   Tobacco Use   Smoking status: Never   Smokeless tobacco: Never  Vaping Use   Vaping Use: Never used  Substance Use Topics  Alcohol use: Yes    Comment: 09/11/2013 "glass of wine/month maybe"   Drug use: No    Home Medications Prior to Admission medications   Medication Sig Start Date End Date Taking? Authorizing Provider  CHLOROPHYLL PO Take 15 mLs by mouth daily. With water.   Yes [provider]  CINNAMON PO Take 1 tablet by mouth 2 (two) times daily.   Yes [provider]  cyclobenzaprine (FLEXERIL) 10 MG tablet Take 10 mg by mouth daily. 09/13/20  Yes [provider]  ergocalciferol (VITAMIN D2) 1.25 MG (50000 UT) capsule Take 50,000 Units by mouth every Monday.   Yes [provider]  furosemide (LASIX) 40 MG tablet Take 40 mg by mouth 2 (two) times daily. 12/07/20  Yes [provider]  Ginger, Zingiber officinalis, (GINGER ROOT PO) Take 1 tablet by mouth 2 (two) times daily.   Yes [provider]  Multiple Vitamins-Minerals (WOMENS MULTI GUMMIES PO) Take  1 tablet by mouth daily.   Yes [provider]  pantoprazole (PROTONIX) 40 MG tablet Take 1 tablet (40 mg total) by mouth daily. 09/30/20  Yes Mannam, Praveen, MD  ELIQUIS 5 MG TABS tablet TAKE 1 TABLET BY MOUTH TWICE A DAY Patient not taking: No sig reported 08/26/20   Mannam, Colbert Coyer, MD  sertraline (ZOLOFT) 50 MG tablet Take 50 mg by mouth daily.  07/04/13 04/10/20  [provider]    Allergies    Patient has no known allergies.  Review of Systems   Review of Systems  Constitutional:  Negative for appetite change, chills and fever.  HENT:  Negative for ear pain, rhinorrhea, sneezing and sore throat.   Eyes:  Negative for photophobia and visual disturbance.  Respiratory:  Negative for cough, chest tightness, shortness of breath and wheezing.   Cardiovascular:  Negative for chest pain and palpitations.  Gastrointestinal:  Positive for abdominal pain, nausea and vomiting. Negative for blood in stool, constipation and diarrhea.  Genitourinary:  Positive for dysuria and flank pain. Negative for hematuria and urgency.  Musculoskeletal:  Negative for myalgias.  Skin:  Negative for rash.  Neurological:  Negative for dizziness, weakness and light-headedness.   Physical Exam Updated Vital Signs BP 133/74   Pulse (!) 107   Temp 98.9 F (37.2 C) (Oral)   Resp (!) 26   Ht 5\' 3"  (1.6 m)   Wt 135.6 kg   LMP 08/07/2013   SpO2 98%   BMI 52.97 kg/m   Physical Exam Vitals and nursing note reviewed.  Constitutional:      General: She is not in acute distress.    Appearance: She is well-developed.  HENT:     Head: Normocephalic and atraumatic.     Nose: Nose normal.  Eyes:     General: No scleral icterus.       Left eye: No discharge.     Conjunctiva/sclera: Conjunctivae normal.  Cardiovascular:     Rate and Rhythm: Regular rhythm. Tachycardia present.     Heart sounds: Normal heart sounds. No murmur heard.   No friction rub. No gallop.  Pulmonary:     Effort:  Pulmonary effort is normal. No respiratory distress.     Breath sounds: Normal breath sounds.  Abdominal:     General: Bowel sounds are normal. There is no distension.     Palpations: Abdomen is soft.     Tenderness: There is abdominal tenderness in the left upper quadrant. There is no right CVA tenderness, left CVA tenderness or guarding.  Musculoskeletal:        General: Normal range of motion.     Cervical back: Normal range of motion and neck supple.  Skin:    General: Skin is warm and dry.     Findings: No rash.  Neurological:     Mental Status: She is alert.     Motor: No abnormal muscle tone.     Coordination: Coordination normal.    ED Results / Procedures / Treatments   Labs (all labs ordered are listed, but only abnormal results are displayed) Labs Reviewed  COMPREHENSIVE METABOLIC PANEL - Abnormal; Notable for the following components:      Result Value   CO2 21 (*)    Glucose, Bld 148 (*)    Total Bilirubin 1.3 (*)    All other components within normal limits  LACTIC ACID, PLASMA - Abnormal; Notable for the following components:   Lactic Acid, Venous 2.5 (*)    All other components within normal limits  LACTIC ACID, PLASMA - Abnormal; Notable for the following components:   Lactic Acid, Venous 2.3 (*)    All other components within normal limits  CBC WITH DIFFERENTIAL/PLATELET - Abnormal; Notable for the following components:   WBC 24.2 (*)    Hemoglobin 15.1 (*)    HCT 46.2 (*)    Neutro Abs 20.3 (*)    Monocytes Absolute 2.2 (*)    Basophils Absolute 0.2 (*)    Abs Immature Granulocytes 0.13 (*)    All other components within normal limits  URINALYSIS, ROUTINE W REFLEX MICROSCOPIC - Abnormal; Notable for the following components:   APPearance HAZY (*)    Specific Gravity, Urine <1.005 (*)    Hgb urine dipstick TRACE (*)    Leukocytes,Ua LARGE (*)    All other components within normal limits  URINALYSIS, MICROSCOPIC (REFLEX) - Abnormal; Notable for the  following components:   Bacteria, UA MANY (*)    All other components within normal limits  CULTURE, BLOOD (ROUTINE X 2)  CULTURE, BLOOD (ROUTINE X 2)  URINE CULTURE  PROTIME-INR    EKG None  Radiology DG Chest 2 View  Result Date: 12/11/2020 CLINICAL DATA:  Fever.  Possible sepsis. EXAM: CHEST - 2 VIEW COMPARISON:  CT chest dated July 30, 2020. Chest x-ray dated June 25, 2020. FINDINGS: The heart size and mediastinal contours are within normal limits. Normal pulmonary vascularity. Mild interstitial coarsening at the lung bases is unchanged. No focal consolidation, pleural effusion, or pneumothorax. No acute osseous abnormality. IMPRESSION: 1. No acute cardiopulmonary disease. Electronically Signed   By: Obie Dredge M.D.   On: 12/11/2020 10:56    Procedures .Critical Care  Date/Time: 12/11/2020 6:54 PM Performed by: Dietrich Pates, PA-C Authorized by: Dietrich Pates, PA-C   Critical care provider statement:    Critical care time (minutes):  35   Critical care time was exclusive of:  Separately billable procedures and treating other patients and teaching time   Critical care was necessary to treat or prevent imminent or life-threatening deterioration of the following conditions:  Circulatory failure, renal failure, sepsis and CNS failure or compromise   Critical care was time spent personally by me on the following activities:  Development of treatment plan with patient or surrogate, discussions with consultants, evaluation of patient's response to treatment, examination of patient, obtaining history from patient or surrogate, ordering and performing treatments and interventions, ordering and review of laboratory studies, ordering and review of radiographic studies, re-evaluation of patient's condition and review of  old charts   I assumed direction of critical care for this patient from another provider in my specialty: no     Medications Ordered in ED Medications  iohexol  (OMNIPAQUE) 350 MG/ML injection 100 mL (has no administration in time range)  acetaminophen (TYLENOL) tablet 650 mg (650 mg Oral Given 12/11/20 1002)  sodium chloride 0.9 % bolus 1,000 mL (1,000 mLs Intravenous New Bag/Given 12/11/20 1758)  fentaNYL (SUBLIMAZE) injection 25 mcg (25 mcg Intravenous Given 12/11/20 1801)  cefTRIAXone (ROCEPHIN) 1 g in sodium chloride 0.9 % 100 mL IVPB (1 g Intravenous New Bag/Given (Non-Interop) 12/11/20 1801)  sodium chloride 0.9 % bolus 500 mL (500 mLs Intravenous New Bag/Given 12/11/20 1758)    ED Course  I have reviewed the triage vital signs and the nursing notes.  Pertinent labs & imaging results that were available during my care of the patient were reviewed by me and considered in my medical decision making (see chart for details).  Clinical Course as of 12/11/20 1854  Wed Dec 11, 2020  1730 Pulse Rate(!): 102 [HK]  1730 Lactic Acid, Venous(!!): 2.5 [HK]  1730 WBC(!): 24.2 [HK]  1731 Leukocytes,Ua(!): LARGE [HK]  1731 Bacteria, UA(!): MANY [HK]  1731 WBC, UA: >50 [HK]  1731 Squamous Epithelial / LPF: 6-10 [HK]  1805 Lactic Acid, Venous(!!): 2.3 [HK]    Clinical Course User Index [HK] Dietrich PatesKhatri, Starsha Morning, PA-C   MDM Rules/Calculators/A&P                           50 year old female presenting to the ED for left-sided abdominal pain.  Symptoms began yesterday.  Reports associated vomiting.  On arrival patient febrile and tachycardic in triage.  Her fever improved with antipyretic.  Her tachycardia has also improved.  On exam she is tender in the left upper quadrant.  No CVA tenderness noted bilaterally.  Work-up significant for urinalysis with UTI, large leukocytes, many bacteria and pyuria.  She has a leukocytosis of 24.2.  Elevated lactic acid level of 2.5.  Urine cultures and blood cultures were obtained.  She will be given Rocephin, IV fluids and pain control as we await CT of the abdomen pelvis. Care handed off to oncoming provider pending CT and recheck  after fluids and medications to determine disposition. Anticipate admission.    Portions of this note were generated with Scientist, clinical (histocompatibility and immunogenetics)Dragon dictation software. Dictation errors may occur despite best attempts at proofreading.  Final Clinical Impression(s) / ED Diagnoses Final diagnoses:  Lower urinary tract infectious disease    Rx / DC Orders ED Discharge Orders     None         Dietrich PatesKhatri, Alexandro Line, PA-C 12/11/20 1855    Sloan LeiterGray, Samuel A, DO 12/11/20 1858

## 2020-12-12 DIAGNOSIS — N10 Acute tubulo-interstitial nephritis: Secondary | ICD-10-CM | POA: Diagnosis present

## 2020-12-12 DIAGNOSIS — A4151 Sepsis due to Escherichia coli [E. coli]: Principal | ICD-10-CM

## 2020-12-12 LAB — CBC
HCT: 39.2 % (ref 36.0–46.0)
Hemoglobin: 12.6 g/dL (ref 12.0–15.0)
MCH: 28.8 pg (ref 26.0–34.0)
MCHC: 32.1 g/dL (ref 30.0–36.0)
MCV: 89.7 fL (ref 80.0–100.0)
Platelets: 298 10*3/uL (ref 150–400)
RBC: 4.37 MIL/uL (ref 3.87–5.11)
RDW: 15.2 % (ref 11.5–15.5)
WBC: 29.2 10*3/uL — ABNORMAL HIGH (ref 4.0–10.5)
nRBC: 0 % (ref 0.0–0.2)

## 2020-12-12 LAB — BLOOD CULTURE ID PANEL (REFLEXED) - BCID2

## 2020-12-12 LAB — BASIC METABOLIC PANEL
Anion gap: 8 (ref 5–15)
BUN: 6 mg/dL (ref 6–20)
CO2: 22 mmol/L (ref 22–32)
Calcium: 8.2 mg/dL — ABNORMAL LOW (ref 8.9–10.3)
Chloride: 107 mmol/L (ref 98–111)
Creatinine, Ser: 0.92 mg/dL (ref 0.44–1.00)
GFR, Estimated: >60 mL/min (ref >60)
Glucose, Bld: 147 mg/dL — ABNORMAL HIGH (ref 70–99)
Potassium: 3.2 mmol/L — ABNORMAL LOW (ref 3.5–5.1)
Sodium: 137 mmol/L (ref 135–145)

## 2020-12-12 MED ORDER — SODIUM CHLORIDE 0.9 % IV SOLN
2.0000 g | INTRAVENOUS | Status: DC
  Administered 2020-12-12 – 2020-12-14 (×3): 2 g via INTRAVENOUS
  Filled 2020-12-12 (×3): qty 20

## 2020-12-12 MED ORDER — POTASSIUM CHLORIDE CRYS ER 20 MEQ PO TBCR
40.0000 meq | EXTENDED_RELEASE_TABLET | Freq: Four times a day (QID) | ORAL | Status: AC
Start: 2020-12-12 — End: 2020-12-12
  Administered 2020-12-12 (×2): 40 meq via ORAL
  Filled 2020-12-12 (×2): qty 2

## 2020-12-12 NOTE — ED Notes (Signed)
Pt DX with sleep apnea, destats to 88% on RA while sleeping, placed on 2 L O2 increased to 92%

## 2020-12-12 NOTE — Progress Notes (Signed)
PHARMACY - PHYSICIAN COMMUNICATION CRITICAL VALUE ALERT - BLOOD CULTURE IDENTIFICATION (BCID)  Christina Castro is an 50 y.o. female who presented to [facility] on 12/11/2020 with a chief complaint of fever, chills, N/V. Reports L flank pain. Pyelonephritis suspected.  Assessment:   2/4 bottles with GNR BCID detected E.coli no resistance  Name of physician (or Provider) Contacted: Elgergawy, Dawood MD  Current antibiotics:  Ceftriaxone 1g IV q24h  Changes to prescribed antibiotics recommended:  Increase dose to Ceftiaxone 2g IV q24h  Results for orders placed or performed during the hospital encounter of 12/11/20  Blood Culture ID Panel (Reflexed) (Collected: 12/11/2020  5:40 PM)  Result Value Ref Range   Enterococcus faecalis NOT DETECTED NOT DETECTED   Enterococcus Faecium NOT DETECTED NOT DETECTED   Listeria monocytogenes NOT DETECTED NOT DETECTED   Staphylococcus species NOT DETECTED NOT DETECTED   Staphylococcus aureus (BCID) NOT DETECTED NOT DETECTED   Staphylococcus epidermidis NOT DETECTED NOT DETECTED   Staphylococcus lugdunensis NOT DETECTED NOT DETECTED   Streptococcus species NOT DETECTED NOT DETECTED   Streptococcus agalactiae NOT DETECTED NOT DETECTED   Streptococcus pneumoniae NOT DETECTED NOT DETECTED   Streptococcus pyogenes NOT DETECTED NOT DETECTED   A.calcoaceticus-baumannii NOT DETECTED NOT DETECTED   Bacteroides fragilis NOT DETECTED NOT DETECTED   Enterobacterales DETECTED (A) NOT DETECTED   Enterobacter cloacae complex NOT DETECTED NOT DETECTED   Escherichia coli DETECTED (A) NOT DETECTED   Klebsiella aerogenes NOT DETECTED NOT DETECTED   Klebsiella oxytoca NOT DETECTED NOT DETECTED   Klebsiella pneumoniae NOT DETECTED NOT DETECTED   Proteus species NOT DETECTED NOT DETECTED   Salmonella species NOT DETECTED NOT DETECTED   Serratia marcescens NOT DETECTED NOT DETECTED   Haemophilus influenzae NOT DETECTED NOT DETECTED   Neisseria meningitidis NOT  DETECTED NOT DETECTED   Pseudomonas aeruginosa NOT DETECTED NOT DETECTED   Stenotrophomonas maltophilia NOT DETECTED NOT DETECTED   Candida albicans NOT DETECTED NOT DETECTED   Candida auris NOT DETECTED NOT DETECTED   Candida glabrata NOT DETECTED NOT DETECTED   Candida krusei NOT DETECTED NOT DETECTED   Candida parapsilosis NOT DETECTED NOT DETECTED   Candida tropicalis NOT DETECTED NOT DETECTED   Cryptococcus neoformans/gattii NOT DETECTED NOT DETECTED   CTX-M ESBL NOT DETECTED NOT DETECTED   Carbapenem resistance IMP NOT DETECTED NOT DETECTED   Carbapenem resistance KPC NOT DETECTED NOT DETECTED   Carbapenem resistance NDM NOT DETECTED NOT DETECTED   Carbapenem resist OXA 48 LIKE NOT DETECTED NOT DETECTED   Carbapenem resistance VIM NOT DETECTED NOT DETECTED    Shirlee More, PharmD PGY2 Infectious Diseases Pharmacy Resident   Please check [email or web address] for unit-specific pharmacy phone numbers

## 2020-12-12 NOTE — Progress Notes (Signed)
PROGRESS NOTE    Christina Castro  HXT:[id number] DOB: [birth year] DOA: 12/11/2020 PCP: Karlton Lemon, PA-C    Chief Complaint  Patient presents with   Abdominal Pain    Brief Narrative:   Christina Castro is a 50 y.o. female with medical history significant for GERD, s/p Gastric bypass surgery, right lower extremity DVT finished her Eliquis treatment, anemia who presents with abdominal pain that began yesterday, work-up significant for sepsis due to acute pyelonephritis.   Assessment & Plan:   Principal Problem:   Pyelonephritis Active Problems:   Morbid obesity (HCC)   Leukocytosis   Bilateral leg edema   Sepsis ([place])   Acute pyelonephritis   Sepsis due to acute pyelonephritis/bacteremia -Sepsis criteria met on admission, febrile, tachycardia with leukocytosis and elevated lactic acid -This is related to acute pyelonephritis and bacteremia. -Blood culture BC ID showing E. coli, increase Rocephin fto 2 GM daily to cover for bacteremia. -Revealed elevated lactic acid I will DC her home dose Lasix for now, and resume when she is more stable. - CT scan showed left perinephric stranding consistent with pyelonephritis.     Bilateral leg edema Patient has chronic edema of her legs and uses Lasix twice a day she reports.  Continue Lasix and monitor fluid status   Morbid obesity Chronic.  Follow with PCP for dietary, lifestyle, pharmacotherapy interventions for long-term weight loss -Patient with history of gastric bypass     DVT prophylaxis: Lovenox Code Status: Full Family Communication: none at bedside Disposition:   Status is: Inpatient  Remains inpatient appropriate because:IV treatments appropriate due to intensity of illness or inability to take PO  Dispo: The patient is from: Home              Anticipated d/c is to: Home              Patient currently is not medically stable to d/c.   Difficult to place patient No       Consultants:   none  Subjective:  Patient reports she is feeling better, pain has improved, no nausea, no vomiting,.  Objective: Vitals:   12/12/20 0501 12/12/20 0600 12/12/20 0700 12/12/20 0800  BP:  118/65 (!) 94/55 95/61  Pulse: 79 84 86 94  Resp: _0 Temp:      TempSrc:      SpO2: 95% 95% 94% 96%  Weight:      Height:        Intake/Output Summary (Last 24 hours) at 12/12/2020 1104 Last data filed at 12/12/2020 0634 Gross per 24 hour  Intake 2000 ml  Output 1000 ml  Net 1000 ml   Filed Weights   12/11/20 0928  Weight: 135.6 kg    Examination:  General exam: Appears calm and comfortable  Respiratory system: Clear to auscultation. Respiratory effort normal. Cardiovascular system: S1 & S2 heard, RRR. No JVD, murmurs, rubs, gallops or clicks. No pedal edema. Gastrointestinal system: Abdomen is nondistended, soft and nontender. No organomegaly or masses felt. Normal bowel sounds heard. Central nervous system: Alert and oriented. No focal neurological deficits. Extremities: Symmetric 5 x 5 power. Skin: No rashes, lesions or ulcers Psychiatry: Judgement and insight appear normal. Mood & affect appropriate.     Data Reviewed: I have personally reviewed following labs and imaging studies  CBC: Recent Labs  Lab 12/11/20 0926 12/12/20 0030  WBC 24.2* 29.2*  NEUTROABS 20.3*  --   HGB 15.1* 12.6  HCT 46.2* 39.2  MCV 90.4 89.7  PLT 371 025    Basic Metabolic Panel: Recent Labs  Lab 12/11/20 0926 12/12/20 0030  NA 135 137  K 4.0 3.2*  CL 105 107  CO2 21* 22  GLUCOSE 148* 147*  BUN 9 6  CREATININE 0.95 0.92  CALCIUM 9.2 8.2*    GFR: Estimated Creatinine Clearance: 96.8 mL/min (by C-G formula based on SCr of 0.92 mg/dL).  Liver Function Tests: Recent Labs  Lab 12/11/20 0926  AST 23  ALT 19  ALKPHOS 61  BILITOT 1.3*  PROT 6.9  ALBUMIN 3.5    CBG: No results for input(s): GLUCAP in the last 168 hours.   Recent Results (from the past 240 hour(s))   Culture, blood (Routine x 2)     Status: None (Preliminary result)   Collection Time: 12/11/20  9:30 AM   Specimen: BLOOD LEFT ARM  Result Value Ref Range Status   Specimen Description BLOOD LEFT ARM  Final   Special Requests   Final    BOTTLES DRAWN AEROBIC AND ANAEROBIC Blood Culture results may not be optimal due to an inadequate volume of blood received in culture bottles   Culture   Final    NO GROWTH 1 DAY Performed at [facility], [place] [address], [place], [place] [address]    Report Status PENDING  Incomplete  Culture, blood (Routine x 2)     Status: None (Preliminary result)   Collection Time: 12/11/20  5:40 PM   Specimen: BLOOD  Result Value Ref Range Status   Specimen Description BLOOD RIGHT ANTECUBITAL  Final   Special Requests   Final    BOTTLES DRAWN AEROBIC AND ANAEROBIC Blood Culture adequate volume   Culture  Setup Time   Final    GRAM NEGATIVE RODS IN BOTH AEROBIC AND ANAEROBIC BOTTLES CRITICAL RESULT CALLED TO, READ BACK BY AND VERIFIED WITH: PHARMD A LAWLESS [id number] AT 918 BY CM Performed at [facility], [place] [address] [place], [place] [address]    Culture GRAM NEGATIVE RODS  Final   Report Status PENDING  Incomplete  Blood Culture ID Panel (Reflexed)     Status: Abnormal   Collection Time: 12/11/20  5:40 PM  Result Value Ref Range Status   Enterococcus faecalis NOT DETECTED NOT DETECTED Final   Enterococcus Faecium NOT DETECTED NOT DETECTED Final   Listeria monocytogenes NOT DETECTED NOT DETECTED Final   Staphylococcus species NOT DETECTED NOT DETECTED Final   Staphylococcus aureus (BCID) NOT DETECTED NOT DETECTED Final   Staphylococcus epidermidis NOT DETECTED NOT DETECTED Final   Staphylococcus lugdunensis NOT DETECTED NOT DETECTED Final   Streptococcus species NOT DETECTED NOT DETECTED Final   Streptococcus agalactiae NOT DETECTED NOT DETECTED Final   Streptococcus pneumoniae NOT DETECTED NOT DETECTED Final   Streptococcus pyogenes NOT  DETECTED NOT DETECTED Final   A.calcoaceticus-baumannii NOT DETECTED NOT DETECTED Final   Bacteroides fragilis NOT DETECTED NOT DETECTED Final   Enterobacterales DETECTED (A) NOT DETECTED Final    Comment: Enterobacterales represent a large order of gram negative bacteria, not a single organism. CRITICAL RESULT CALLED TO, READ BACK BY AND VERIFIED WITH: PHARMD A LAWLESS [id number] AT 917 AM BY CM    Enterobacter cloacae complex NOT DETECTED NOT DETECTED Final   Escherichia coli DETECTED (A) NOT DETECTED Final    Comment: CRITICAL RESULT CALLED TO, READ BACK BY AND VERIFIED WITH: PHARMD A LAWLESS [id number] AT 917 AM BY CM    Klebsiella aerogenes NOT DETECTED NOT DETECTED Final  Klebsiella oxytoca NOT DETECTED NOT DETECTED Final   Klebsiella pneumoniae NOT DETECTED NOT DETECTED Final   Proteus species NOT DETECTED NOT DETECTED Final   Salmonella species NOT DETECTED NOT DETECTED Final   Serratia marcescens NOT DETECTED NOT DETECTED Final   Haemophilus influenzae NOT DETECTED NOT DETECTED Final   Neisseria meningitidis NOT DETECTED NOT DETECTED Final   Pseudomonas aeruginosa NOT DETECTED NOT DETECTED Final   Stenotrophomonas maltophilia NOT DETECTED NOT DETECTED Final   Candida albicans NOT DETECTED NOT DETECTED Final   Candida auris NOT DETECTED NOT DETECTED Final   Candida glabrata NOT DETECTED NOT DETECTED Final   Candida krusei NOT DETECTED NOT DETECTED Final   Candida parapsilosis NOT DETECTED NOT DETECTED Final   Candida tropicalis NOT DETECTED NOT DETECTED Final   Cryptococcus neoformans/gattii NOT DETECTED NOT DETECTED Final   CTX-M ESBL NOT DETECTED NOT DETECTED Final   Carbapenem resistance IMP NOT DETECTED NOT DETECTED Final   Carbapenem resistance KPC NOT DETECTED NOT DETECTED Final   Carbapenem resistance NDM NOT DETECTED NOT DETECTED Final   Carbapenem resist OXA 48 LIKE NOT DETECTED NOT DETECTED Final   Carbapenem resistance VIM NOT DETECTED NOT DETECTED Final    Comment:  Performed at [facility], [address], [place], [place] [address]  Resp Panel by RT-PCR (Flu A&B, Covid) Nasopharyngeal Swab     Status: None   Collection Time: 12/11/20  7:38 PM   Specimen: Nasopharyngeal Swab; Nasopharyngeal(NP) swabs in vial transport medium  Result Value Ref Range Status   SARS Coronavirus 2 by RT PCR NEGATIVE NEGATIVE Final    Comment: (NOTE) SARS-CoV-2 target nucleic acids are NOT DETECTED.  The SARS-CoV-2 RNA is generally detectable in upper respiratory specimens during the acute phase of infection. The lowest concentration of SARS-CoV-2 viral copies this assay can detect is 138 copies/mL. A negative result does not preclude SARS-Cov-2 infection and should not be used as the sole basis for treatment or other patient management decisions. A negative result may occur with  improper specimen collection/handling, submission of specimen other than nasopharyngeal swab, presence of viral mutation(s) within the areas targeted by this assay, and inadequate number of viral copies(<138 copies/mL). A negative result must be combined with clinical observations, patient history, and epidemiological information. The expected result is Negative.  Fact Sheet for Patients:  EntrepreneurPulse.com.au  Fact Sheet for Healthcare Providers:  IncredibleEmployment.be  This test is no t yet approved or cleared by the [place] FDA and  has been authorized for detection and/or diagnosis of SARS-CoV-2 by FDA under an Emergency Use Authorization (EUA). This EUA will remain  in effect (meaning this test can be used) for the duration of the COVID-19 declaration under Section 564(b)(1) of the Act, 21 U.S.C.section 360bbb-3(b)(1), unless the authorization is terminated  or revoked sooner.       Influenza A by PCR NEGATIVE NEGATIVE Final   Influenza B by PCR NEGATIVE NEGATIVE Final    Comment: (NOTE) The Xpert Xpress SARS-CoV-2/FLU/RSV  plus assay is intended as an aid in the diagnosis of influenza from Nasopharyngeal swab specimens and should not be used as a sole basis for treatment. Nasal washings and aspirates are unacceptable for Xpert Xpress SARS-CoV-2/FLU/RSV testing.  Fact Sheet for Patients: EntrepreneurPulse.com.au  Fact Sheet for Healthcare Providers: IncredibleEmployment.be  This test is not yet approved or cleared by the [place] FDA and has been authorized for detection and/or diagnosis of SARS-CoV-2 by FDA under an Emergency Use Authorization (EUA). This EUA will remain  in effect (meaning this test can be used) for the duration of the COVID-19 declaration under Section 564(b)(1) of the Act, 21 U.S.C. section 360bbb-3(b)(1), unless the authorization is terminated or revoked.  Performed at [facility], [place] [address], [place], [place] [address]          Radiology Studies: DG Chest 2 View  Result Date: 12/11/2020 CLINICAL DATA:  Fever.  Possible sepsis. EXAM: CHEST - 2 VIEW COMPARISON:  CT chest dated July 30, 2020. Chest x-ray dated June 25, 2020. FINDINGS: The heart size and mediastinal contours are within normal limits. Normal pulmonary vascularity. Mild interstitial coarsening at the lung bases is unchanged. No focal consolidation, pleural effusion, or pneumothorax. No acute osseous abnormality. IMPRESSION: 1. No acute cardiopulmonary disease. Electronically Signed   By: Titus Dubin M.D.   On: 12/11/2020 10:56   CT ABDOMEN PELVIS W CONTRAST  Result Date: 12/11/2020 CLINICAL DATA:  Abdominal pain, fever EXAM: CT ABDOMEN AND PELVIS WITH CONTRAST TECHNIQUE: Multidetector CT imaging of the abdomen and pelvis was performed using the standard protocol following bolus administration of intravenous contrast. CONTRAST:  124m OMNIPAQUE IOHEXOL 350 MG/ML SOLN COMPARISON:  07/06/2013 FINDINGS: Lower chest: Lung bases are clear. No effusions. Heart is  normal size. Hepatobiliary: No focal liver abnormality is seen. Status post cholecystectomy. No biliary dilatation. Pancreas: No focal abnormality or ductal dilatation. Spleen: No focal abnormality.  Normal size. Adrenals/Urinary Tract: Decreased enhancement throughout the left kidney with stranding around the left kidney. There appears to be enhancement within the wall of the renal pelvis and ureter. Findings concerning for possible pyelonephritis. No hydronephrosis. Right kidney and adrenal glands unremarkable. Urinary bladder grossly unremarkable. Stomach/Bowel: Stomach, large and small bowel grossly unremarkable. Vascular/Lymphatic: No evidence of aneurysm or adenopathy. Reproductive: Probable small mass. Fibroids in the uterus. No adnexal Other: No free fluid or free air. Musculoskeletal: No acute bony abnormality. IMPRESSION: Decreased enhancement throughout the left kidney with perinephric stranding. Findings concerning for left pyelonephritis. Electronically Signed   By: KRolm BaptiseM.D.   On: 12/11/2020 18:58        Scheduled Meds:  cyclobenzaprine  10 mg Oral Daily   enoxaparin (LOVENOX) injection  40 mg Subcutaneous Q24H   furosemide  40 mg Oral BID   pantoprazole  40 mg Oral Daily   potassium chloride  40 mEq Oral Q6H   Continuous Infusions:  cefTRIAXone (ROCEPHIN)  IV     lactated ringers 125 mL/hr at 12/12/20 0634     LOS: 1 day       DPhillips Climes MD [facility]   To contact the attending provider between 7A-7P or the covering provider during after hours 7P-7A, please log into the web site [email or web address] and access using universal  password for that web site. If you do not have the password, please call the hospital operator.  12/12/2020, 11:04 AM

## 2020-12-12 NOTE — Plan of Care (Signed)
  Problem: Education: Goal: Knowledge of General Education information will improve Description: Including pain rating scale, medication(s)/side effects and non-pharmacologic comfort measures Outcome: Progressing   Problem: Clinical Measurements: Goal: Will remain free from infection Outcome: Progressing   Problem: Coping: Goal: Level of anxiety will decrease Outcome: Progressing   Problem: Elimination: Goal: Will not experience complications related to urinary retention Outcome: Progressing   

## 2020-12-13 LAB — URINE CULTURE

## 2020-12-13 LAB — CBC
HCT: 37.2 % (ref 36.0–46.0)
Hemoglobin: 12.2 g/dL (ref 12.0–15.0)
MCH: 29.3 pg (ref 26.0–34.0)
MCHC: 32.8 g/dL (ref 30.0–36.0)
MCV: 89.4 fL (ref 80.0–100.0)
Platelets: 258 10*3/uL (ref 150–400)
RBC: 4.16 MIL/uL (ref 3.87–5.11)
RDW: 15.6 % — ABNORMAL HIGH (ref 11.5–15.5)
WBC: 13.8 10*3/uL — ABNORMAL HIGH (ref 4.0–10.5)
nRBC: 0 % (ref 0.0–0.2)

## 2020-12-13 LAB — BASIC METABOLIC PANEL
Anion gap: 6 (ref 5–15)
BUN: <5 mg/dL — ABNORMAL LOW (ref 6–20)
CO2: 24 mmol/L (ref 22–32)
Calcium: 8.6 mg/dL — ABNORMAL LOW (ref 8.9–10.3)
Chloride: 107 mmol/L (ref 98–111)
Creatinine, Ser: 0.85 mg/dL (ref 0.44–1.00)
GFR, Estimated: >60 mL/min (ref >60)
Glucose, Bld: 129 mg/dL — ABNORMAL HIGH (ref 70–99)
Potassium: 3.9 mmol/L (ref 3.5–5.1)
Sodium: 137 mmol/L (ref 135–145)

## 2020-12-13 NOTE — Progress Notes (Signed)
PROGRESS NOTE    Christina Castro  ZOX:[id number] DOB: 1971/04/04 DOA: 12/11/2020 PCP: Karlton Lemon, PA-C    Chief Complaint  Patient presents with   Abdominal Pain    Brief Narrative:   Christina Castro is a 50 y.o. female with medical history significant for GERD, s/p Gastric bypass surgery, right lower extremity DVT finished her Eliquis treatment, anemia who presents with abdominal pain that began yesterday, work-up significant for sepsis due to acute pyelonephritis.  And bacteremia   Assessment & Plan:   Principal Problem:   Pyelonephritis Active Problems:   Morbid obesity (HCC)   Leukocytosis   Bilateral leg edema   Sepsis ([place])   Acute pyelonephritis   Sepsis due to acute pyelonephritis/bacteremia -Sepsis criteria met on admission, febrile, tachycardia with leukocytosis and elevated lactic acid -This is related to acute pyelonephritis and bacteremia. -Blood culture BC ID showing E. coli, continue with IV Rocephin, follow on sensitivity/susceptibility  -With elevated lactic acid, but with known CHF, continue to hold home dose Lasix.   - CT scan showed left perinephric stranding consistent with pyelonephritis.    Bilateral leg edema Patient has chronic edema of her legs and uses Lasix twice a day she reports.  Continue Lasix and monitor fluid status   Morbid obesity Chronic.  Follow with PCP for dietary, lifestyle, pharmacotherapy interventions for long-term weight loss -Patient with history of gastric bypass     DVT prophylaxis: Lovenox Code Status: Full Family Communication: none at bedside Disposition:   Status is: Inpatient  Remains inpatient appropriate because:IV treatments appropriate due to intensity of illness or inability to take PO  Dispo: The patient is from: Home              Anticipated d/c is to: Home              Patient currently is not medically stable to d/c.   Difficult to place patient No       Consultants:   none  Subjective:  Patient reports she is feeling better, pain has improved, no nausea, no vomiting,.  Objective: Vitals:   12/12/20 2315 12/13/20 0100 12/13/20 0538 12/13/20 0956  BP:  115/72 132/87 132/84  Pulse:  98 100 92  Resp:  _0 Temp: 99.6 F (37.6 C) 99 F (37.2 C) 99.7 F (37.6 C) 98.9 F (37.2 C)  TempSrc: Oral Oral Oral Oral  SpO2:  97% 97% 94%  Weight:      Height:        Intake/Output Summary (Last 24 hours) at 12/13/2020 1330 Last data filed at 12/13/2020 0600 Gross per 24 hour  Intake 2614.14 ml  Output --  Net 2614.14 ml   Filed Weights   12/11/20 0928 12/12/20 1320 12/12/20 2245  Weight: 135.6 kg (!) 140.6 kg (!) 140.2 kg    Examination:  Awake Alert, Oriented X 3, No new F.N deficits, Normal affect Symmetrical Chest wall movement, Good air movement bilaterally, CTAB RRR,No Gallops,Rubs or new Murmurs, No Parasternal Heave +ve B.Sounds, Abd Soft, No tenderness, No rebound - guarding or rigidity. No Cyanosis, Clubbing or edema, No new Rash or bruise       Data Reviewed: I have personally reviewed following labs and imaging studies  CBC: Recent Labs  Lab 12/11/20 0926 12/12/20 0030 12/13/20 0307  WBC 24.2* 29.2* 13.8*  NEUTROABS 20.3*  --   --   HGB 15.1* 12.6 12.2  HCT 46.2* 39.2 37.2  MCV 90.4 89.7 89.4  PLT 371 298  048    Basic Metabolic Panel: Recent Labs  Lab 12/11/20 0926 12/12/20 0030 12/13/20 0307  NA 135 137 137  K 4.0 3.2* 3.9  CL 105 107 107  CO2 21* 22 24  GLUCOSE 148* 147* 129*  BUN 9 6 <5*  CREATININE 0.95 0.92 0.85  CALCIUM 9.2 8.2* 8.6*    GFR: Estimated Creatinine Clearance: 106.9 mL/min (by C-G formula based on SCr of 0.85 mg/dL).  Liver Function Tests: Recent Labs  Lab 12/11/20 0926  AST 23  ALT 19  ALKPHOS 61  BILITOT 1.3*  PROT 6.9  ALBUMIN 3.5    CBG: No results for input(s): GLUCAP in the last 168 hours.   Recent Results (from the past 240 hour(s))  Culture, blood (Routine x  2)     Status: None (Preliminary result)   Collection Time: 12/11/20  9:30 AM   Specimen: BLOOD LEFT ARM  Result Value Ref Range Status   Specimen Description BLOOD LEFT ARM  Final   Special Requests   Final    BOTTLES DRAWN AEROBIC AND ANAEROBIC Blood Culture results may not be optimal due to an inadequate volume of blood received in culture bottles   Culture   Final    NO GROWTH 1 DAY Performed at [facility], [place] [address], [place], [place] [address]    Report Status PENDING  Incomplete  Urine Culture     Status: Abnormal   Collection Time: 12/11/20  5:31 PM   Specimen: Urine, Clean Catch  Result Value Ref Range Status   Specimen Description URINE, CLEAN CATCH  Final   Special Requests   Final    NONE Performed at [facility], [place] [address] [place], [place] [address]    Culture MULTIPLE SPECIES PRESENT, SUGGEST RECOLLECTION (A)  Final   Report Status 12/13/2020 FINAL  Final  Culture, blood (Routine x 2)     Status: Abnormal (Preliminary result)   Collection Time: 12/11/20  5:40 PM   Specimen: BLOOD  Result Value Ref Range Status   Specimen Description BLOOD RIGHT ANTECUBITAL  Final   Special Requests   Final    BOTTLES DRAWN AEROBIC AND ANAEROBIC Blood Culture adequate volume   Culture  Setup Time   Final    GRAM NEGATIVE RODS IN BOTH AEROBIC AND ANAEROBIC BOTTLES CRITICAL RESULT CALLED TO, READ BACK BY AND VERIFIED WITH: PHARMD A LAWLESS [id number] AT 918 BY CM    Culture (A)  Final    ESCHERICHIA COLI SUSCEPTIBILITIES TO FOLLOW Performed at [facility], [place] [address], [place], [place] [address]    Report Status PENDING  Incomplete  Blood Culture ID Panel (Reflexed)     Status: Abnormal   Collection Time: 12/11/20  5:40 PM  Result Value Ref Range Status   Enterococcus faecalis NOT DETECTED NOT DETECTED Final   Enterococcus Faecium NOT DETECTED NOT DETECTED Final   Listeria monocytogenes NOT DETECTED NOT DETECTED Final   Staphylococcus species  NOT DETECTED NOT DETECTED Final   Staphylococcus aureus (BCID) NOT DETECTED NOT DETECTED Final   Staphylococcus epidermidis NOT DETECTED NOT DETECTED Final   Staphylococcus lugdunensis NOT DETECTED NOT DETECTED Final   Streptococcus species NOT DETECTED NOT DETECTED Final   Streptococcus agalactiae NOT DETECTED NOT DETECTED Final   Streptococcus pneumoniae NOT DETECTED NOT DETECTED Final   Streptococcus pyogenes NOT DETECTED NOT DETECTED Final   A.calcoaceticus-baumannii NOT DETECTED NOT DETECTED Final   Bacteroides fragilis NOT DETECTED NOT DETECTED Final  Enterobacterales DETECTED (A) NOT DETECTED Final    Comment: Enterobacterales represent a large order of gram negative bacteria, not a single organism. CRITICAL RESULT CALLED TO, READ BACK BY AND VERIFIED WITH: PHARMD A LAWLESS [id number] AT 48 AM BY CM    Enterobacter cloacae complex NOT DETECTED NOT DETECTED Final   Escherichia coli DETECTED (A) NOT DETECTED Final    Comment: CRITICAL RESULT CALLED TO, READ BACK BY AND VERIFIED WITH: PHARMD A LAWLESS [id number] AT 74 AM BY CM    Klebsiella aerogenes NOT DETECTED NOT DETECTED Final   Klebsiella oxytoca NOT DETECTED NOT DETECTED Final   Klebsiella pneumoniae NOT DETECTED NOT DETECTED Final   Proteus species NOT DETECTED NOT DETECTED Final   Salmonella species NOT DETECTED NOT DETECTED Final   Serratia marcescens NOT DETECTED NOT DETECTED Final   Haemophilus influenzae NOT DETECTED NOT DETECTED Final   Neisseria meningitidis NOT DETECTED NOT DETECTED Final   Pseudomonas aeruginosa NOT DETECTED NOT DETECTED Final   Stenotrophomonas maltophilia NOT DETECTED NOT DETECTED Final   Candida albicans NOT DETECTED NOT DETECTED Final   Candida auris NOT DETECTED NOT DETECTED Final   Candida glabrata NOT DETECTED NOT DETECTED Final   Candida krusei NOT DETECTED NOT DETECTED Final   Candida parapsilosis NOT DETECTED NOT DETECTED Final   Candida tropicalis NOT DETECTED NOT DETECTED Final    Cryptococcus neoformans/gattii NOT DETECTED NOT DETECTED Final   CTX-M ESBL NOT DETECTED NOT DETECTED Final   Carbapenem resistance IMP NOT DETECTED NOT DETECTED Final   Carbapenem resistance KPC NOT DETECTED NOT DETECTED Final   Carbapenem resistance NDM NOT DETECTED NOT DETECTED Final   Carbapenem resist OXA 48 LIKE NOT DETECTED NOT DETECTED Final   Carbapenem resistance VIM NOT DETECTED NOT DETECTED Final    Comment: Performed at [facility], [address], [place], [place] [address]  Resp Panel by RT-PCR (Flu A&B, Covid) Nasopharyngeal Swab     Status: None   Collection Time: 12/11/20  7:38 PM   Specimen: Nasopharyngeal Swab; Nasopharyngeal(NP) swabs in vial transport medium  Result Value Ref Range Status   SARS Coronavirus 2 by RT PCR NEGATIVE NEGATIVE Final    Comment: (NOTE) SARS-CoV-2 target nucleic acids are NOT DETECTED.  The SARS-CoV-2 RNA is generally detectable in upper respiratory specimens during the acute phase of infection. The lowest concentration of SARS-CoV-2 viral copies this assay can detect is 138 copies/mL. A negative result does not preclude SARS-Cov-2 infection and should not be used as the sole basis for treatment or other patient management decisions. A negative result may occur with  improper specimen collection/handling, submission of specimen other than nasopharyngeal swab, presence of viral mutation(s) within the areas targeted by this assay, and inadequate number of viral copies(<138 copies/mL). A negative result must be combined with clinical observations, patient history, and epidemiological information. The expected result is Negative.  Fact Sheet for Patients:  EntrepreneurPulse.com.au  Fact Sheet for Healthcare Providers:  IncredibleEmployment.be  This test is no t yet approved or cleared by the [place] FDA and  has been authorized for detection and/or diagnosis of SARS-CoV-2 by FDA under an  Emergency Use Authorization (EUA). This EUA will remain  in effect (meaning this test can be used) for the duration of the COVID-19 declaration under Section 564(b)(1) of the Act, 21 U.S.C.section 360bbb-3(b)(1), unless the authorization is terminated  or revoked sooner.       Influenza A by PCR NEGATIVE NEGATIVE Final   Influenza B by PCR NEGATIVE NEGATIVE  Final    Comment: (NOTE) The Xpert Xpress SARS-CoV-2/FLU/RSV plus assay is intended as an aid in the diagnosis of influenza from Nasopharyngeal swab specimens and should not be used as a sole basis for treatment. Nasal washings and aspirates are unacceptable for Xpert Xpress SARS-CoV-2/FLU/RSV testing.  Fact Sheet for Patients: EntrepreneurPulse.com.au  Fact Sheet for Healthcare Providers: IncredibleEmployment.be  This test is not yet approved or cleared by the [place] FDA and has been authorized for detection and/or diagnosis of SARS-CoV-2 by FDA under an Emergency Use Authorization (EUA). This EUA will remain in effect (meaning this test can be used) for the duration of the COVID-19 declaration under Section 564(b)(1) of the Act, 21 U.S.C. section 360bbb-3(b)(1), unless the authorization is terminated or revoked.  Performed at [facility], [place] [address], [place], [place] [address]          Radiology Studies: CT ABDOMEN PELVIS W CONTRAST  Result Date: 12/11/2020 CLINICAL DATA:  Abdominal pain, fever EXAM: CT ABDOMEN AND PELVIS WITH CONTRAST TECHNIQUE: Multidetector CT imaging of the abdomen and pelvis was performed using the standard protocol following bolus administration of intravenous contrast. CONTRAST:  142m OMNIPAQUE IOHEXOL 350 MG/ML SOLN COMPARISON:  07/06/2013 FINDINGS: Lower chest: Lung bases are clear. No effusions. Heart is normal size. Hepatobiliary: No focal liver abnormality is seen. Status post cholecystectomy. No biliary dilatation. Pancreas: No focal  abnormality or ductal dilatation. Spleen: No focal abnormality.  Normal size. Adrenals/Urinary Tract: Decreased enhancement throughout the left kidney with stranding around the left kidney. There appears to be enhancement within the wall of the renal pelvis and ureter. Findings concerning for possible pyelonephritis. No hydronephrosis. Right kidney and adrenal glands unremarkable. Urinary bladder grossly unremarkable. Stomach/Bowel: Stomach, large and small bowel grossly unremarkable. Vascular/Lymphatic: No evidence of aneurysm or adenopathy. Reproductive: Probable small mass. Fibroids in the uterus. No adnexal Other: No free fluid or free air. Musculoskeletal: No acute bony abnormality. IMPRESSION: Decreased enhancement throughout the left kidney with perinephric stranding. Findings concerning for left pyelonephritis. Electronically Signed   By: KRolm BaptiseM.D.   On: 12/11/2020 18:58        Scheduled Meds:  cyclobenzaprine  10 mg Oral Daily   enoxaparin (LOVENOX) injection  40 mg Subcutaneous Q24H   pantoprazole  40 mg Oral Daily   Continuous Infusions:  cefTRIAXone (ROCEPHIN)  IV 2 g (12/12/20 2035)   lactated ringers 75 mL/hr at 12/13/20 0035     LOS: 2 days       DPhillips Climes MD [facility]   To contact the attending provider between 7A-7P or the covering provider during after hours 7P-7A, please log into the web site [email or web address] and access using universal [place] password for that web site. If you do not have the password, please call the hospital operator.  12/13/2020, 1:30 PM

## 2020-12-13 NOTE — Plan of Care (Signed)

## 2020-12-14 LAB — CBC
HCT: 38.5 % (ref 36.0–46.0)
Hemoglobin: 12.7 g/dL (ref 12.0–15.0)
MCH: 28.8 pg (ref 26.0–34.0)
MCHC: 33 g/dL (ref 30.0–36.0)
MCV: 87.3 fL (ref 80.0–100.0)
Platelets: 298 10*3/uL (ref 150–400)
RBC: 4.41 MIL/uL (ref 3.87–5.11)
RDW: 15 % (ref 11.5–15.5)
WBC: 10.1 10*3/uL (ref 4.0–10.5)
nRBC: 0 % (ref 0.0–0.2)

## 2020-12-14 LAB — BASIC METABOLIC PANEL
Anion gap: 9 (ref 5–15)
BUN: 5 mg/dL — ABNORMAL LOW (ref 6–20)
CO2: 24 mmol/L (ref 22–32)
Calcium: 8.9 mg/dL (ref 8.9–10.3)
Chloride: 104 mmol/L (ref 98–111)
Creatinine, Ser: 0.91 mg/dL (ref 0.44–1.00)
GFR, Estimated: >60 mL/min (ref >60)
Glucose, Bld: 119 mg/dL — ABNORMAL HIGH (ref 70–99)
Potassium: 4 mmol/L (ref 3.5–5.1)
Sodium: 137 mmol/L (ref 135–145)

## 2020-12-14 LAB — CULTURE, BLOOD (ROUTINE X 2): Special Requests: ADEQUATE

## 2020-12-14 NOTE — Progress Notes (Signed)
PROGRESS NOTE    Christina Castro  MBE:[id number] DOB: 11/18/1970 DOA: 12/11/2020 PCP: Karlton Lemon, PA-C   Chief Complaint  Patient presents with   Abdominal Pain   Brief Narrative: 50 year old female with GERD status post gastric bypass, RLE DVT status post completion of Eliquis, chronic anemia presents with abdominal pain and found to have E. coli sepsis/acute pyelonephritis  Subjective: Feels good slept well, no flank pain or chills Want to shower T-max 100, stable vitals w/ blood pressure 100s to 130s, 100 Leukocytosis resolved  Assessment & Plan:  Severe sepsis POA secondary to E. Coli bactremia Left pyelonephritis-CT surgery perinephric stranding with decreased advancement throughout lt kidney: Patient met severe sepsis criteria on admission with heart rate 98, leukocytosis 29 k, lactic acidosis 2.3 with pyelonephritis. Sepsis physiology has resolved.  Currently hemodynamically stable.  Leukocytosis resolved.  Follow-up culture sensitivity on E. Coli.  Urine culture multiple species.  Continue ceftriaxone 2 g daily.  Bilateral leg edema/chronic edema: Uses Lasix twice a day, continue same.  Elevate the leg.  Off IV fluids  Morbid obesity BMI 44/history of gastric bypass: Patient evaluated weight loss, healthy lifestyle and PCP follow-up.  Advised sleep evaluation as OP  Diet Order             Diet Heart Room service appropriate? Yes; Fluid consistency: Thin  Diet effective now                  Patient's Body mass index is 54.36 kg/m.  DVT prophylaxis: enoxaparin (LOVENOX) injection 40 mg Start: 12/11/20 2045 Code Status:   Code Status: Full Code  Family Communication: plan of care discussed with patient at bedside. Status is: Inpatient  Remains inpatient appropriate because:IV treatments appropriate due to intensity of illness or inability to take PO and Inpatient level of care appropriate due to severity of illness Dispo: The patient is from: Home with  daughter              Anticipated d/c is to: Home              Patient currently is not medically stable to d/c.   Difficult to place patient No  Unresulted Labs (From admission, onward)     Start     Ordered   12/18/20 0500  Creatinine, serum  (enoxaparin (LOVENOX)    CrCl >/= 30 ml/min)  Weekly,   R     Comments: while on enoxaparin therapy    12/11/20 2041           Medications reviewed:  Scheduled Meds:  cyclobenzaprine  10 mg Oral Daily   enoxaparin (LOVENOX) injection  40 mg Subcutaneous Q24H   pantoprazole  40 mg Oral Daily   Continuous Infusions:  cefTRIAXone (ROCEPHIN)  IV 2 g (12/13/20 2012)   Consultants:see note  Procedures:see note Antimicrobials: Anti-infectives (From admission, onward)    Start     Dose/Rate Route Frequency Ordered Stop   12/12/20 2045  cefTRIAXone (ROCEPHIN) 2 g in sodium chloride 0.9 % 100 mL IVPB        2 g 200 mL/hr over 30 Minutes Intravenous Every 24 hours 12/12/20 1007     12/11/20 2045  cefTRIAXone (ROCEPHIN) 1 g in sodium chloride 0.9 % 100 mL IVPB  Status:  Discontinued        1 g 200 mL/hr over 30 Minutes Intravenous Every 24 hours 12/11/20 2041 12/12/20 1007   12/11/20 1730  cefTRIAXone (ROCEPHIN) 1 g in sodium chloride 0.9 % 100  mL IVPB        1 g 200 mL/hr over 30 Minutes Intravenous  Once 12/11/20 1729 12/11/20 2100      Culture/Microbiology    Component Value Date/Time   SDES BLOOD RIGHT ANTECUBITAL 12/11/2020 1740   SPECREQUEST  12/11/2020 1740    BOTTLES DRAWN AEROBIC AND ANAEROBIC Blood Culture adequate volume   CULT (A) 12/11/2020 1740    ESCHERICHIA COLI SUSCEPTIBILITIES TO FOLLOW Performed at [facility] [address], [place], [place] [address]    REPTSTATUS PENDING 12/11/2020 1740    Other culture-see note  Objective: Vitals: Today's Vitals   12/13/20 2000 12/13/20 2100 12/14/20 0500 12/14/20 0605  BP:  116/78 109/65   Pulse:  93 (!) 101   Resp:  18 15   Temp:  98.8 F (37.1 C) 99.5 F  (37.5 C)   TempSrc:  Oral Oral   SpO2:  96% 94%   Weight:  (!) 139.2 kg    Height:      PainSc: 0-No pain   Asleep    Intake/Output Summary (Last 24 hours) at 12/14/2020 0857 Last data filed at 12/14/2020 0600 Gross per 24 hour  Intake 1861.03 ml  Output --  Net 1861.03 ml   Filed Weights   12/12/20 1320 12/12/20 2245 12/13/20 2100  Weight: (!) 140.6 kg (!) 140.2 kg (!) 139.2 kg   Weight change: -1.4 kg  Intake/Output from previous day: 08/12 0701 - 08/13 0700 In: 2091 [P.O.:1430; I.V.:561; IV Piggyback:100] Out: -  Intake/Output this shift: No intake/output data recorded. Filed Weights   12/12/20 1320 12/12/20 2245 12/13/20 2100  Weight: (!) 140.6 kg (!) 140.2 kg (!) 139.2 kg   Examination:  General exam: AAO x3 , older than stated age, weak appearing. HEENT:Oral mucosa moist, Ear/Nose WNL grossly,dentition normal. Respiratory system: bilaterally diminished, no use of accessory muscle, non tender. Cardiovascular system: S1 & S2 +,No JVD. Gastrointestinal system: Abdomen soft, NT,ND, BS+. Nervous System:Alert, awake, moving extremities Extremities: chronic leg edema, distal peripheral pulses palpable.  Skin: No rashes,no icterus. MSK: Normal muscle bulk,tone, power Data Reviewed: I have personally reviewed following labs and imaging studies CBC: Recent Labs  Lab 12/11/20 0926 12/12/20 0030 12/13/20 0307 12/14/20 0508  WBC 24.2* 29.2* 13.8* 10.1  NEUTROABS 20.3*  --   --   --   HGB 15.1* 12.6 12.2 12.7  HCT 46.2* 39.2 37.2 38.5  MCV 90.4 89.7 89.4 87.3  PLT 371 298 258 151   Basic Metabolic Panel: Recent Labs  Lab 12/11/20 0926 12/12/20 0030 12/13/20 0307 12/14/20 0508  NA 135 137 137 137  K 4.0 3.2* 3.9 4.0  CL 105 107 107 104  CO2 21* $Remov'22 24 24  'QXkpKq$ GLUCOSE 148* 147* 129* 119*  BUN 9 6 <5* 5*  CREATININE 0.95 0.92 0.85 0.91  CALCIUM 9.2 8.2* 8.6* 8.9   GFR: Estimated Creatinine Clearance: 99.4 mL/min (by C-G formula based on SCr of 0.91  mg/dL). Liver Function Tests: Recent Labs  Lab 12/11/20 0926  AST 23  ALT 19  ALKPHOS 61  BILITOT 1.3*  PROT 6.9  ALBUMIN 3.5   No results for input(s): LIPASE, AMYLASE in the last 168 hours. No results for input(s): AMMONIA in the last 168 hours. Coagulation Profile: Recent Labs  Lab 12/11/20 0926  INR 1.1   Cardiac Enzymes: No results for input(s): CKTOTAL, CKMB, CKMBINDEX, TROPONINI in the last 168 hours. BNP (last 3 results) No results for input(s): PROBNP in the last 8760 hours. HbA1C:  No results for input(s): HGBA1C in the last 72 hours. CBG: No results for input(s): GLUCAP in the last 168 hours. Lipid Profile: No results for input(s): CHOL, HDL, LDLCALC, TRIG, CHOLHDL, LDLDIRECT in the last 72 hours. Thyroid Function Tests: No results for input(s): TSH, T4TOTAL, FREET4, T3FREE, THYROIDAB in the last 72 hours. Anemia Panel: No results for input(s): VITAMINB12, FOLATE, FERRITIN, TIBC, IRON, RETICCTPCT in the last 72 hours. Sepsis Labs: Recent Labs  Lab 12/11/20 0926 12/11/20 1750  LATICACIDVEN 2.5* 2.3*    Recent Results (from the past 240 hour(s))  Culture, blood (Routine x 2)     Status: None (Preliminary result)   Collection Time: 12/11/20  9:30 AM   Specimen: BLOOD LEFT ARM  Result Value Ref Range Status   Specimen Description BLOOD LEFT ARM  Final   Special Requests   Final    BOTTLES DRAWN AEROBIC AND ANAEROBIC Blood Culture results may not be optimal due to an inadequate volume of blood received in culture bottles   Culture   Final    NO GROWTH 2 DAYS Performed at [facility], [place] [address], [place], [place] [address]    Report Status PENDING  Incomplete  Urine Culture     Status: Abnormal   Collection Time: 12/11/20  5:31 PM   Specimen: Urine, Clean Catch  Result Value Ref Range Status   Specimen Description URINE, CLEAN CATCH  Final   Special Requests   Final    NONE Performed at [facility], [place] [address] [place],  [place] [address]    Culture MULTIPLE SPECIES PRESENT, SUGGEST RECOLLECTION (A)  Final   Report Status 12/13/2020 FINAL  Final  Culture, blood (Routine x 2)     Status: Abnormal (Preliminary result)   Collection Time: 12/11/20  5:40 PM   Specimen: BLOOD  Result Value Ref Range Status   Specimen Description BLOOD RIGHT ANTECUBITAL  Final   Special Requests   Final    BOTTLES DRAWN AEROBIC AND ANAEROBIC Blood Culture adequate volume   Culture  Setup Time   Final    GRAM NEGATIVE RODS IN BOTH AEROBIC AND ANAEROBIC BOTTLES CRITICAL RESULT CALLED TO, READ BACK BY AND VERIFIED WITH: PHARMD A LAWLESS [id number] AT 918 BY CM    Culture (A)  Final    ESCHERICHIA COLI SUSCEPTIBILITIES TO FOLLOW Performed at [facility], [place] [address] [place], [place] [address]    Report Status PENDING  Incomplete  Blood Culture ID Panel (Reflexed)     Status: Abnormal   Collection Time: 12/11/20  5:40 PM  Result Value Ref Range Status   Enterococcus faecalis NOT DETECTED NOT DETECTED Final   Enterococcus Faecium NOT DETECTED NOT DETECTED Final   Listeria monocytogenes NOT DETECTED NOT DETECTED Final   Staphylococcus species NOT DETECTED NOT DETECTED Final   Staphylococcus aureus (BCID) NOT DETECTED NOT DETECTED Final   Staphylococcus epidermidis NOT DETECTED NOT DETECTED Final   Staphylococcus lugdunensis NOT DETECTED NOT DETECTED Final   Streptococcus species NOT DETECTED NOT DETECTED Final   Streptococcus agalactiae NOT DETECTED NOT DETECTED Final   Streptococcus pneumoniae NOT DETECTED NOT DETECTED Final   Streptococcus pyogenes NOT DETECTED NOT DETECTED Final   A.calcoaceticus-baumannii NOT DETECTED NOT DETECTED Final   Bacteroides fragilis NOT DETECTED NOT DETECTED Final   Enterobacterales DETECTED (A) NOT DETECTED Final    Comment: Enterobacterales represent a large order of gram negative bacteria, not a single organism. CRITICAL RESULT CALLED TO, READ BACK BY AND  VERIFIED WITH: PHARMD A LAWLESS  [id number] AT 951 AM BY CM    Enterobacter cloacae complex NOT DETECTED NOT DETECTED Final   Escherichia coli DETECTED (A) NOT DETECTED Final    Comment: CRITICAL RESULT CALLED TO, READ BACK BY AND VERIFIED WITH: PHARMD A LAWLESS [id number] AT 063 AM BY CM    Klebsiella aerogenes NOT DETECTED NOT DETECTED Final   Klebsiella oxytoca NOT DETECTED NOT DETECTED Final   Klebsiella pneumoniae NOT DETECTED NOT DETECTED Final   Proteus species NOT DETECTED NOT DETECTED Final   Salmonella species NOT DETECTED NOT DETECTED Final   Serratia marcescens NOT DETECTED NOT DETECTED Final   Haemophilus influenzae NOT DETECTED NOT DETECTED Final   Neisseria meningitidis NOT DETECTED NOT DETECTED Final   Pseudomonas aeruginosa NOT DETECTED NOT DETECTED Final   Stenotrophomonas maltophilia NOT DETECTED NOT DETECTED Final   Candida albicans NOT DETECTED NOT DETECTED Final   Candida auris NOT DETECTED NOT DETECTED Final   Candida glabrata NOT DETECTED NOT DETECTED Final   Candida krusei NOT DETECTED NOT DETECTED Final   Candida parapsilosis NOT DETECTED NOT DETECTED Final   Candida tropicalis NOT DETECTED NOT DETECTED Final   Cryptococcus neoformans/gattii NOT DETECTED NOT DETECTED Final   CTX-M ESBL NOT DETECTED NOT DETECTED Final   Carbapenem resistance IMP NOT DETECTED NOT DETECTED Final   Carbapenem resistance KPC NOT DETECTED NOT DETECTED Final   Carbapenem resistance NDM NOT DETECTED NOT DETECTED Final   Carbapenem resist OXA 48 LIKE NOT DETECTED NOT DETECTED Final   Carbapenem resistance VIM NOT DETECTED NOT DETECTED Final    Comment: Performed at [facility], [address] [place], [place] [address]  Resp Panel by RT-PCR (Flu A&B, Covid) Nasopharyngeal Swab     Status: None   Collection Time: 12/11/20  7:38 PM   Specimen: Nasopharyngeal Swab; Nasopharyngeal(NP) swabs in vial transport medium  Result Value Ref Range Status   SARS Coronavirus 2 by RT PCR NEGATIVE NEGATIVE Final    Comment:  (NOTE) SARS-CoV-2 target nucleic acids are NOT DETECTED.  The SARS-CoV-2 RNA is generally detectable in upper respiratory specimens during the acute phase of infection. The lowest concentration of SARS-CoV-2 viral copies this assay can detect is 138 copies/mL. A negative result does not preclude SARS-Cov-2 infection and should not be used as the sole basis for treatment or other patient management decisions. A negative result may occur with  improper specimen collection/handling, submission of specimen other than nasopharyngeal swab, presence of viral mutation(s) within the areas targeted by this assay, and inadequate number of viral copies(<138 copies/mL). A negative result must be combined with clinical observations, patient history, and epidemiological information. The expected result is Negative.  Fact Sheet for Patients:  EntrepreneurPulse.com.au  Fact Sheet for Healthcare Providers:  IncredibleEmployment.be  This test is no t yet approved or cleared by the [place] FDA and  has been authorized for detection and/or diagnosis of SARS-CoV-2 by FDA under an Emergency Use Authorization (EUA). This EUA will remain  in effect (meaning this test can be used) for the duration of the COVID-19 declaration under Section 564(b)(1) of the Act, 21 U.S.C.section 360bbb-3(b)(1), unless the authorization is terminated  or revoked sooner.       Influenza A by PCR NEGATIVE NEGATIVE Final   Influenza B by PCR NEGATIVE NEGATIVE Final    Comment: (NOTE) The Xpert Xpress SARS-CoV-2/FLU/RSV plus assay is intended as an aid in the diagnosis of influenza from Nasopharyngeal swab specimens and should not be used  as a sole basis for treatment. Nasal washings and aspirates are unacceptable for Xpert Xpress SARS-CoV-2/FLU/RSV testing.  Fact Sheet for Patients: EntrepreneurPulse.com.au  Fact Sheet for Healthcare  Providers: IncredibleEmployment.be  This test is not yet approved or cleared by the [place] FDA and has been authorized for detection and/or diagnosis of SARS-CoV-2 by FDA under an Emergency Use Authorization (EUA). This EUA will remain in effect (meaning this test can be used) for the duration of the COVID-19 declaration under Section 564(b)(1) of the Act, 21 U.S.C. section 360bbb-3(b)(1), unless the authorization is terminated or revoked.  Performed at [facility], [place] [address], [place], [place] [address]      Radiology Studies: No results found.   LOS: 3 days   Antonieta Pert, MD [facility]  12/14/2020, 8:57 AM

## 2020-12-15 DIAGNOSIS — R7881 Bacteremia: Secondary | ICD-10-CM

## 2020-12-15 DIAGNOSIS — B9620 Unspecified Escherichia coli [E. coli] as the cause of diseases classified elsewhere: Secondary | ICD-10-CM

## 2020-12-15 LAB — BASIC METABOLIC PANEL
Anion gap: 10 (ref 5–15)
BUN: 9 mg/dL (ref 6–20)
CO2: 24 mmol/L (ref 22–32)
Calcium: 9 mg/dL (ref 8.9–10.3)
Chloride: 104 mmol/L (ref 98–111)
Creatinine, Ser: 0.87 mg/dL (ref 0.44–1.00)
GFR, Estimated: >60 mL/min (ref >60)
Glucose, Bld: 127 mg/dL — ABNORMAL HIGH (ref 70–99)
Potassium: 4.1 mmol/L (ref 3.5–5.1)
Sodium: 138 mmol/L (ref 135–145)

## 2020-12-15 LAB — CBC
HCT: 42.1 % (ref 36.0–46.0)
Hemoglobin: 13.9 g/dL (ref 12.0–15.0)
MCH: 29 pg (ref 26.0–34.0)
MCHC: 33 g/dL (ref 30.0–36.0)
MCV: 87.7 fL (ref 80.0–100.0)
Platelets: 320 10*3/uL (ref 150–400)
RBC: 4.8 MIL/uL (ref 3.87–5.11)
RDW: 14.9 % (ref 11.5–15.5)
WBC: 12.8 10*3/uL — ABNORMAL HIGH (ref 4.0–10.5)
nRBC: 0 % (ref 0.0–0.2)

## 2020-12-15 MED ORDER — CEPHALEXIN 500 MG PO CAPS: 500.0000 mg | ORAL_CAPSULE | Freq: Four times a day (QID) | ORAL | 0 refills | Status: AC

## 2020-12-15 NOTE — Discharge Summary (Signed)
Physician Discharge Summary  Christina Castro AOZ:[id number] DOB: Sep 29, 1970 DOA: 12/11/2020  PCP: Christia Reading, PA-C  Admit date: 12/11/2020 Discharge date: 12/15/2020  Admitted From: Home Disposition: Home  Recommendations for Outpatient Follow-up:  Follow up with PCP in 1 week with repeat CBC/BMP Follow up in ED if symptoms worsen or new appear   Home Health: No Equipment/Devices: None  Discharge Condition: Stable CODE STATUS: Full Diet recommendation: Heart healthy  Brief/Interim Summary: 50 year old female with GERD status post gastric bypass, RLE DVT status post completion of Eliquis, chronic anemia presented with abdominal pain and was found to have E. coli sepsis/acute pyelonephritis.  She was treated with IV antibiotics.  During the hospitalization, her symptoms have much improved.  She is currently hemodynamically stable and tolerating diet well.  She will be discharged home on oral antibiotics.  Discharge Diagnoses:   Severe sepsis: Present on admission; resolved E. coli bacteremia Left pyelonephritis Leukocytosis: Mild -Presented with severe sepsis with pyelonephritis, lactic acidosis, leukocytosis, tachycardia.  Sepsis has resolved.  Currently on Rocephin. -She is hemodynamically stable and tolerating diet well.  E. coli sensitivities available.  Discharged home on oral Keflex for 6 more days.  Outpatient follow-up with PCP.  Bilateral chronic leg edema -Continue Lasix twice a day.  Outpatient follow-up.  Morbid obesity -outpatient follow-up  Discharge Instructions  Discharge Instructions     Diet - low sodium heart healthy   Complete by: As directed    Increase activity slowly   Complete by: As directed       Allergies as of 12/15/2020   No Known Allergies      Medication List     STOP taking these medications    Eliquis 5 MG Tabs tablet Generic drug: apixaban       TAKE these medications    cephALEXin 500 MG capsule Commonly known as:  KEFLEX Take 1 capsule (500 mg total) by mouth 4 (four) times daily for 6 days.   CHLOROPHYLL PO Take 15 mLs by mouth daily. With water.   CINNAMON PO Take 1 tablet by mouth 2 (two) times daily.   cyclobenzaprine 10 MG tablet Commonly known as: FLEXERIL Take 10 mg by mouth daily.   ergocalciferol 1.25 MG (50000 UT) capsule Commonly known as: VITAMIN D2 Take 50,000 Units by mouth every Monday.   furosemide 40 MG tablet Commonly known as: LASIX Take 40 mg by mouth 2 (two) times daily.   GINGER ROOT PO Take 1 tablet by mouth 2 (two) times daily.   pantoprazole 40 MG tablet Commonly known as: PROTONIX Take 1 tablet (40 mg total) by mouth daily.   WOMENS MULTI GUMMIES PO Take 1 tablet by mouth daily.        Follow-up Information     Christia Reading, PA-C. Schedule an appointment as soon as possible for a visit in 1 week(s).   Specialty: Physician Assistant Why: with repeat cbc/bmp Contact information: [address] [place] [address] [phone]                No Known Allergies  Consultations: None   Procedures/Studies: DG Chest 2 View  Result Date: 12/11/2020 CLINICAL DATA:  Fever.  Possible sepsis. EXAM: CHEST - 2 VIEW COMPARISON:  CT chest dated July 30, 2020. Chest x-ray dated June 25, 2020. FINDINGS: The heart size and mediastinal contours are within normal limits. Normal pulmonary vascularity. Mild interstitial coarsening at the lung bases is unchanged. No focal consolidation, pleural effusion, or pneumothorax. No acute  osseous abnormality. IMPRESSION: 1. No acute cardiopulmonary disease. Electronically Signed   By: Obie Dredge M.D.   On: 12/11/2020 10:56   CT ABDOMEN PELVIS W CONTRAST  Result Date: 12/11/2020 CLINICAL DATA:  Abdominal pain, fever EXAM: CT ABDOMEN AND PELVIS WITH CONTRAST TECHNIQUE: Multidetector CT imaging of the abdomen and pelvis was performed using the standard protocol following bolus administration of  intravenous contrast. CONTRAST:  OMNIPAQUE IOHEXOL 350 MG/ML SOLN COMPARISON:  07/06/2013 FINDINGS: Lower chest: Lung bases are clear. No effusions. Heart is normal size. Hepatobiliary: No focal liver abnormality is seen. Status post cholecystectomy. No biliary dilatation. Pancreas: No focal abnormality or ductal dilatation. Spleen: No focal abnormality.  Normal size. Adrenals/Urinary Tract: Decreased enhancement throughout the left kidney with stranding around the left kidney. There appears to be enhancement within the wall of the renal pelvis and ureter. Findings concerning for possible pyelonephritis. No hydronephrosis. Right kidney and adrenal glands unremarkable. Urinary bladder grossly unremarkable. Stomach/Bowel: Stomach, large and small bowel grossly unremarkable. Vascular/Lymphatic: No evidence of aneurysm or adenopathy. Reproductive: Probable small mass. Fibroids in the uterus. No adnexal Other: No free fluid or free air. Musculoskeletal: No acute bony abnormality. IMPRESSION: Decreased enhancement throughout the left kidney with perinephric stranding. Findings concerning for left pyelonephritis. Electronically Signed   By: Charlett Nose M.D.   On: 12/11/2020 18:58      Subjective: Patient seen and examined at bedside.  Feels much better and feels okay to go home today.  Denies overnight fever, vomiting.  Tolerating diet.  Discharge Exam: Vitals:   12/15/20 0450 12/15/20 0850  BP: 110/68 129/78  Pulse: 72 (!) 49  Resp: 16 17  Temp: 98.4 F (36.9 C) 98 F (36.7 C)  SpO2: 96% 97%    General: Pt is alert, awake, not in acute distress Cardiovascular: Bradycardic intermittently; S1/S2 + Respiratory: bilateral decreased breath sounds at bases Abdominal: Soft, morbidly obese, NT, ND, bowel sounds + Extremities: Bilateral lower extremity edema present; no cyanosis    The results of significant diagnostics from this hospitalization (including imaging, microbiology, ancillary and  laboratory) are listed below for reference.     Microbiology: Recent Results (from the past 240 hour(s))  Culture, blood (Routine x 2)     Status: None (Preliminary result)   Collection Time: 12/11/20  9:30 AM   Specimen: BLOOD LEFT ARM  Result Value Ref Range Status   Specimen Description BLOOD LEFT ARM  Final   Special Requests   Final    BOTTLES DRAWN AEROBIC AND ANAEROBIC Blood Culture results may not be optimal due to an inadequate volume of blood received in culture bottles   Culture   Final    NO GROWTH 4 DAYS Performed at [facility], [address] [place], [place] [address]    Report Status PENDING  Incomplete  Urine Culture     Status: Abnormal   Collection Time: 12/11/20  5:31 PM   Specimen: Urine, Clean Catch  Result Value Ref Range Status   Specimen Description URINE, CLEAN CATCH  Final   Special Requests   Final    NONE Performed at [facility], [address], [place], [place] [address]    Culture MULTIPLE SPECIES PRESENT, SUGGEST RECOLLECTION (A)  Final   Report Status 12/13/2020 FINAL  Final  Culture, blood (Routine x 2)     Status: Abnormal   Collection Time: 12/11/20  5:40 PM   Specimen: BLOOD  Result Value Ref Range Status   Specimen Description  BLOOD RIGHT ANTECUBITAL  Final   Special Requests   Final    BOTTLES DRAWN AEROBIC AND ANAEROBIC Blood Culture adequate volume   Culture  Setup Time   Final    GRAM NEGATIVE RODS IN BOTH AEROBIC AND ANAEROBIC BOTTLES CRITICAL RESULT CALLED TO, READ BACK BY AND VERIFIED WITH: PHARMD A LAWLESS [id number] AT 918 BY CM Performed at [facility], [address] [place], [place] [address]    Culture ESCHERICHIA COLI (A)  Final   Report Status 12/14/2020 FINAL  Final   Organism ID, Bacteria ESCHERICHIA COLI  Final      Susceptibility   Escherichia coli - MIC*    AMPICILLIN >=32 RESISTANT Resistant     CEFAZOLIN <=4 SENSITIVE Sensitive     CEFEPIME <=0.12 SENSITIVE Sensitive     CEFTAZIDIME <=1  SENSITIVE Sensitive     CEFTRIAXONE <=0.25 SENSITIVE Sensitive     CIPROFLOXACIN <=0.25 SENSITIVE Sensitive     GENTAMICIN <=1 SENSITIVE Sensitive     IMIPENEM <=0.25 SENSITIVE Sensitive     TRIMETH/SULFA <=20 SENSITIVE Sensitive     AMPICILLIN/SULBACTAM 16 INTERMEDIATE Intermediate     PIP/TAZO <=4 SENSITIVE Sensitive     * ESCHERICHIA COLI  Blood Culture ID Panel (Reflexed)     Status: Abnormal   Collection Time: 12/11/20  5:40 PM  Result Value Ref Range Status   Enterococcus faecalis NOT DETECTED NOT DETECTED Final   Enterococcus Faecium NOT DETECTED NOT DETECTED Final   Listeria monocytogenes NOT DETECTED NOT DETECTED Final   Staphylococcus species NOT DETECTED NOT DETECTED Final   Staphylococcus aureus (BCID) NOT DETECTED NOT DETECTED Final   Staphylococcus epidermidis NOT DETECTED NOT DETECTED Final   Staphylococcus lugdunensis NOT DETECTED NOT DETECTED Final   Streptococcus species NOT DETECTED NOT DETECTED Final   Streptococcus agalactiae NOT DETECTED NOT DETECTED Final   Streptococcus pneumoniae NOT DETECTED NOT DETECTED Final   Streptococcus pyogenes NOT DETECTED NOT DETECTED Final   A.calcoaceticus-baumannii NOT DETECTED NOT DETECTED Final   Bacteroides fragilis NOT DETECTED NOT DETECTED Final   Enterobacterales DETECTED (A) NOT DETECTED Final    Comment: Enterobacterales represent a large order of gram negative bacteria, not a single organism. CRITICAL RESULT CALLED TO, READ BACK BY AND VERIFIED WITH: PHARMD A LAWLESS [id number] AT 917 AM BY CM    Enterobacter cloacae complex NOT DETECTED NOT DETECTED Final   Escherichia coli DETECTED (A) NOT DETECTED Final    Comment: CRITICAL RESULT CALLED TO, READ BACK BY AND VERIFIED WITH: PHARMD A LAWLESS [id number] AT 917 AM BY CM    Klebsiella aerogenes NOT DETECTED NOT DETECTED Final   Klebsiella oxytoca NOT DETECTED NOT DETECTED Final   Klebsiella pneumoniae NOT DETECTED NOT DETECTED Final   Proteus species NOT DETECTED NOT DETECTED  Final   Salmonella species NOT DETECTED NOT DETECTED Final   Serratia marcescens NOT DETECTED NOT DETECTED Final   Haemophilus influenzae NOT DETECTED NOT DETECTED Final   Neisseria meningitidis NOT DETECTED NOT DETECTED Final   Pseudomonas aeruginosa NOT DETECTED NOT DETECTED Final   Stenotrophomonas maltophilia NOT DETECTED NOT DETECTED Final   Candida albicans NOT DETECTED NOT DETECTED Final   Candida auris NOT DETECTED NOT DETECTED Final   Candida glabrata NOT DETECTED NOT DETECTED Final   Candida krusei NOT DETECTED NOT DETECTED Final   Candida parapsilosis NOT DETECTED NOT DETECTED Final   Candida tropicalis NOT DETECTED NOT DETECTED Final   Cryptococcus neoformans/gattii NOT DETECTED NOT DETECTED Final   CTX-M ESBL NOT DETECTED  NOT DETECTED Final   Carbapenem resistance IMP NOT DETECTED NOT DETECTED Final   Carbapenem resistance KPC NOT DETECTED NOT DETECTED Final   Carbapenem resistance NDM NOT DETECTED NOT DETECTED Final   Carbapenem resist OXA 48 LIKE NOT DETECTED NOT DETECTED Final   Carbapenem resistance VIM NOT DETECTED NOT DETECTED Final    Comment: Performed at [facility], [address], [place], [place] [address]  Resp Panel by RT-PCR (Flu A&B, Covid) Nasopharyngeal Swab     Status: None   Collection Time: 12/11/20  7:38 PM   Specimen: Nasopharyngeal Swab; Nasopharyngeal(NP) swabs in vial transport medium  Result Value Ref Range Status   SARS Coronavirus 2 by RT PCR NEGATIVE NEGATIVE Final    Comment: (NOTE) SARS-CoV-2 target nucleic acids are NOT DETECTED.  The SARS-CoV-2 RNA is generally detectable in upper respiratory specimens during the acute phase of infection. The lowest concentration of SARS-CoV-2 viral copies this assay can detect is 138 copies/mL. A negative result does not preclude SARS-Cov-2 infection and should not be used as the sole basis for treatment or other patient management decisions. A negative result may occur with  improper specimen  collection/handling, submission of specimen other than nasopharyngeal swab, presence of viral mutation(s) within the areas targeted by this assay, and inadequate number of viral copies(<138 copies/mL). A negative result must be combined with clinical observations, patient history, and epidemiological information. The expected result is Negative.  Fact Sheet for Patients:  BloggerCourse.com  Fact Sheet for Healthcare Providers:  SeriousBroker.it  This test is no t yet approved or cleared by the [place] FDA and  has been authorized for detection and/or diagnosis of SARS-CoV-2 by FDA under an Emergency Use Authorization (EUA). This EUA will remain  in effect (meaning this test can be used) for the duration of the COVID-19 declaration under Section 564(b)(1) of the Act, 21 U.S.C.section 360bbb-3(b)(1), unless the authorization is terminated  or revoked sooner.       Influenza A by PCR NEGATIVE NEGATIVE Final   Influenza B by PCR NEGATIVE NEGATIVE Final    Comment: (NOTE) The Xpert Xpress SARS-CoV-2/FLU/RSV plus assay is intended as an aid in the diagnosis of influenza from Nasopharyngeal swab specimens and should not be used as a sole basis for treatment. Nasal washings and aspirates are unacceptable for Xpert Xpress SARS-CoV-2/FLU/RSV testing.  Fact Sheet for Patients: BloggerCourse.com  Fact Sheet for Healthcare Providers: SeriousBroker.it  This test is not yet approved or cleared by the [place] FDA and has been authorized for detection and/or diagnosis of SARS-CoV-2 by FDA under an Emergency Use Authorization (EUA). This EUA will remain in effect (meaning this test can be used) for the duration of the COVID-19 declaration under Section 564(b)(1) of the Act, 21 U.S.C. section 360bbb-3(b)(1), unless the authorization is terminated or revoked.  Performed at [facility], [address] [place], [place] [address]      Labs: BNP (last 3 results) Recent Labs    05/25/20 0448 05/28/20 0648  BNP 52.3 25.3   Basic Metabolic Panel: Recent Labs  Lab 12/11/20 0926 12/12/20 0030 12/13/20 0307 12/14/20 0508 12/15/20 0400  NA 135 137 137 137 138  K 4.0 3.2* 3.9 4.0 4.1  CL 105 107 107 104 104  CO2 21* GLUCOSE 148* 147* 129* 119* 127*  BUN 9 6 <5* 5* 9  CREATININE 0.95 0.92 0.85 0.91 0.87  CALCIUM 9.2 8.2* 8.6* 8.9 9.0   Liver Function  Tests: Recent Labs  Lab 12/11/20 0926  AST 23  ALT 19  ALKPHOS 61  BILITOT 1.3*  PROT 6.9  ALBUMIN 3.5   No results for input(s): LIPASE, AMYLASE in the last 168 hours. No results for input(s): AMMONIA in the last 168 hours. CBC: Recent Labs  Lab 12/11/20 0926 12/12/20 0030 12/13/20 0307 12/14/20 0508 12/15/20 0400  WBC 24.2* 29.2* 13.8* 10.1 12.8*  NEUTROABS 20.3*  --   --   --   --   HGB 15.1* 12.6 12.2 12.7 13.9  HCT 46.2* 39.2 37.2 38.5 42.1  MCV 90.4 89.7 89.4 87.3 87.7  PLT 371 298 258 298 320   Cardiac Enzymes: No results for input(s): CKTOTAL, CKMB, CKMBINDEX, TROPONINI in the last 168 hours. BNP: Invalid input(s): POCBNP CBG: No results for input(s): GLUCAP in the last 168 hours. D-Dimer No results for input(s): DDIMER in the last 72 hours. Hgb A1c No results for input(s): HGBA1C in the last 72 hours. Lipid Profile No results for input(s): CHOL, HDL, LDLCALC, TRIG, CHOLHDL, LDLDIRECT in the last 72 hours. Thyroid function studies No results for input(s): TSH, T4TOTAL, T3FREE, THYROIDAB in the last 72 hours.  Invalid input(s): FREET3 Anemia work up No results for input(s): VITAMINB12, FOLATE, FERRITIN, TIBC, IRON, RETICCTPCT in the last 72 hours. Urinalysis    Component Value Date/Time   COLORURINE YELLOW 12/11/2020 0926   APPEARANCEUR HAZY (A) 12/11/2020 0926   LABSPEC <1.005 (L) 12/11/2020 0926   PHURINE 6.0 12/11/2020 0926   GLUCOSEU NEGATIVE  12/11/2020 0926   HGBUR TRACE (A) 12/11/2020 0926   BILIRUBINUR NEGATIVE 12/11/2020 0926   KETONESUR NEGATIVE 12/11/2020 0926   PROTEINUR NEGATIVE 12/11/2020 0926   NITRITE NEGATIVE 12/11/2020 0926   LEUKOCYTESUR LARGE (A) 12/11/2020 0926   Sepsis Labs Invalid input(s): PROCALCITONIN,  WBC,  LACTICIDVEN Microbiology Recent Results (from the past 240 hour(s))  Culture, blood (Routine x 2)     Status: None (Preliminary result)   Collection Time: 12/11/20  9:30 AM   Specimen: BLOOD LEFT ARM  Result Value Ref Range Status   Specimen Description BLOOD LEFT ARM  Final   Special Requests   Final    BOTTLES DRAWN AEROBIC AND ANAEROBIC Blood Culture results may not be optimal due to an inadequate volume of blood received in culture bottles   Culture   Final    NO GROWTH 4 DAYS Performed at [facility], [address] [place], [place] [address]    Report Status PENDING  Incomplete  Urine Culture     Status: Abnormal   Collection Time: 12/11/20  5:31 PM   Specimen: Urine, Clean Catch  Result Value Ref Range Status   Specimen Description URINE, CLEAN CATCH  Final   Special Requests   Final    NONE Performed at [facility], [address] [place], [place] [address]    Culture MULTIPLE SPECIES PRESENT, SUGGEST RECOLLECTION (A)  Final   Report Status 12/13/2020 FINAL  Final  Culture, blood (Routine x 2)     Status: Abnormal   Collection Time: 12/11/20  5:40 PM   Specimen: BLOOD  Result Value Ref Range Status   Specimen Description BLOOD RIGHT ANTECUBITAL  Final   Special Requests   Final    BOTTLES DRAWN AEROBIC AND ANAEROBIC Blood Culture adequate volume   Culture  Setup Time   Final    GRAM NEGATIVE RODS IN BOTH AEROBIC AND ANAEROBIC BOTTLES CRITICAL RESULT CALLED TO, READ BACK BY AND VERIFIED  WITH: PHARMD A LAWLESS [id number] AT 918 BY CM Performed at [facility], [address], [place], [place] [address]    Culture ESCHERICHIA COLI (A)  Final   Report Status  12/14/2020 FINAL  Final   Organism ID, Bacteria ESCHERICHIA COLI  Final      Susceptibility   Escherichia coli - MIC*    AMPICILLIN >=32 RESISTANT Resistant     CEFAZOLIN <=4 SENSITIVE Sensitive     CEFEPIME <=0.12 SENSITIVE Sensitive     CEFTAZIDIME <=1 SENSITIVE Sensitive     CEFTRIAXONE <=0.25 SENSITIVE Sensitive     CIPROFLOXACIN <=0.25 SENSITIVE Sensitive     GENTAMICIN <=1 SENSITIVE Sensitive     IMIPENEM <=0.25 SENSITIVE Sensitive     TRIMETH/SULFA <=20 SENSITIVE Sensitive     AMPICILLIN/SULBACTAM 16 INTERMEDIATE Intermediate     PIP/TAZO <=4 SENSITIVE Sensitive     * ESCHERICHIA COLI  Blood Culture ID Panel (Reflexed)     Status: Abnormal   Collection Time: 12/11/20  5:40 PM  Result Value Ref Range Status   Enterococcus faecalis NOT DETECTED NOT DETECTED Final   Enterococcus Faecium NOT DETECTED NOT DETECTED Final   Listeria monocytogenes NOT DETECTED NOT DETECTED Final   Staphylococcus species NOT DETECTED NOT DETECTED Final   Staphylococcus aureus (BCID) NOT DETECTED NOT DETECTED Final   Staphylococcus epidermidis NOT DETECTED NOT DETECTED Final   Staphylococcus lugdunensis NOT DETECTED NOT DETECTED Final   Streptococcus species NOT DETECTED NOT DETECTED Final   Streptococcus agalactiae NOT DETECTED NOT DETECTED Final   Streptococcus pneumoniae NOT DETECTED NOT DETECTED Final   Streptococcus pyogenes NOT DETECTED NOT DETECTED Final   A.calcoaceticus-baumannii NOT DETECTED NOT DETECTED Final   Bacteroides fragilis NOT DETECTED NOT DETECTED Final   Enterobacterales DETECTED (A) NOT DETECTED Final    Comment: Enterobacterales represent a large order of gram negative bacteria, not a single organism. CRITICAL RESULT CALLED TO, READ BACK BY AND VERIFIED WITH: PHARMD A LAWLESS [id number] AT 917 AM BY CM    Enterobacter cloacae complex NOT DETECTED NOT DETECTED Final   Escherichia coli DETECTED (A) NOT DETECTED Final    Comment: CRITICAL RESULT CALLED TO, READ BACK BY AND  VERIFIED WITH: PHARMD A LAWLESS [id number] AT 917 AM BY CM    Klebsiella aerogenes NOT DETECTED NOT DETECTED Final   Klebsiella oxytoca NOT DETECTED NOT DETECTED Final   Klebsiella pneumoniae NOT DETECTED NOT DETECTED Final   Proteus species NOT DETECTED NOT DETECTED Final   Salmonella species NOT DETECTED NOT DETECTED Final   Serratia marcescens NOT DETECTED NOT DETECTED Final   Haemophilus influenzae NOT DETECTED NOT DETECTED Final   Neisseria meningitidis NOT DETECTED NOT DETECTED Final   Pseudomonas aeruginosa NOT DETECTED NOT DETECTED Final   Stenotrophomonas maltophilia NOT DETECTED NOT DETECTED Final   Candida albicans NOT DETECTED NOT DETECTED Final   Candida auris NOT DETECTED NOT DETECTED Final   Candida glabrata NOT DETECTED NOT DETECTED Final   Candida krusei NOT DETECTED NOT DETECTED Final   Candida parapsilosis NOT DETECTED NOT DETECTED Final   Candida tropicalis NOT DETECTED NOT DETECTED Final   Cryptococcus neoformans/gattii NOT DETECTED NOT DETECTED Final   CTX-M ESBL NOT DETECTED NOT DETECTED Final   Carbapenem resistance IMP NOT DETECTED NOT DETECTED Final   Carbapenem resistance KPC NOT DETECTED NOT DETECTED Final   Carbapenem resistance NDM NOT DETECTED NOT DETECTED Final   Carbapenem resist OXA 48 LIKE NOT DETECTED NOT DETECTED Final   Carbapenem resistance VIM NOT DETECTED NOT DETECTED  Final    Comment: Performed at [facility], [address] [place], [place] [address]  Resp Panel by RT-PCR (Flu A&B, Covid) Nasopharyngeal Swab     Status: None   Collection Time: 12/11/20  7:38 PM   Specimen: Nasopharyngeal Swab; Nasopharyngeal(NP) swabs in vial transport medium  Result Value Ref Range Status   SARS Coronavirus 2 by RT PCR NEGATIVE NEGATIVE Final    Comment: (NOTE) SARS-CoV-2 target nucleic acids are NOT DETECTED.  The SARS-CoV-2 RNA is generally detectable in upper respiratory specimens during the acute phase of infection. The lowest concentration of  SARS-CoV-2 viral copies this assay can detect is 138 copies/mL. A negative result does not preclude SARS-Cov-2 infection and should not be used as the sole basis for treatment or other patient management decisions. A negative result may occur with  improper specimen collection/handling, submission of specimen other than nasopharyngeal swab, presence of viral mutation(s) within the areas targeted by this assay, and inadequate number of viral copies(<138 copies/mL). A negative result must be combined with clinical observations, patient history, and epidemiological information. The expected result is Negative.  Fact Sheet for Patients:  BloggerCourse.com  Fact Sheet for Healthcare Providers:  SeriousBroker.it  This test is no t yet approved or cleared by the [place] FDA and  has been authorized for detection and/or diagnosis of SARS-CoV-2 by FDA under an Emergency Use Authorization (EUA). This EUA will remain  in effect (meaning this test can be used) for the duration of the COVID-19 declaration under Section 564(b)(1) of the Act, 21 U.S.C.section 360bbb-3(b)(1), unless the authorization is terminated  or revoked sooner.       Influenza A by PCR NEGATIVE NEGATIVE Final   Influenza B by PCR NEGATIVE NEGATIVE Final    Comment: (NOTE) The Xpert Xpress SARS-CoV-2/FLU/RSV plus assay is intended as an aid in the diagnosis of influenza from Nasopharyngeal swab specimens and should not be used as a sole basis for treatment. Nasal washings and aspirates are unacceptable for Xpert Xpress SARS-CoV-2/FLU/RSV testing.  Fact Sheet for Patients: BloggerCourse.com  Fact Sheet for Healthcare Providers: SeriousBroker.it  This test is not yet approved or cleared by the [place] FDA and has been authorized for detection and/or diagnosis of SARS-CoV-2 by FDA under an Emergency Use  Authorization (EUA). This EUA will remain in effect (meaning this test can be used) for the duration of the COVID-19 declaration under Section 564(b)(1) of the Act, 21 U.S.C. section 360bbb-3(b)(1), unless the authorization is terminated or revoked.  Performed at [facility], [address], [place], [place] [address]      Time coordinating discharge: 35 minutes  SIGNED:   Glade Lloyd, MD  [facility] 12/15/2020, 11:03 AM

## 2020-12-16 LAB — CULTURE, BLOOD (ROUTINE X 2): Culture: NO GROWTH

## 2020-12-19 ENCOUNTER — Other Ambulatory Visit: Payer: Self-pay | Admitting: Gastroenterology

## 2020-12-19 ENCOUNTER — Other Ambulatory Visit (HOSPITAL_COMMUNITY): Payer: Self-pay | Admitting: Gastroenterology

## 2020-12-19 DIAGNOSIS — K760 Fatty (change of) liver, not elsewhere classified: Secondary | ICD-10-CM

## 2020-12-30 ENCOUNTER — Other Ambulatory Visit: Payer: Self-pay

## 2020-12-30 ENCOUNTER — Ambulatory Visit (HOSPITAL_COMMUNITY)
Admission: RE | Admit: 2020-12-30 | Discharge: 2020-12-30 | Disposition: A | Discharge status: Home / Self Care | Payer: 59 | Source: Ambulatory Visit | Attending: Gastroenterology | Admitting: Gastroenterology

## 2020-12-30 DIAGNOSIS — K760 Fatty (change of) liver, not elsewhere classified: Secondary | ICD-10-CM | POA: Diagnosis present

## 2021-01-15 NOTE — Progress Notes (Signed)
Spoke to patient regarding colonoscopy scheduled for 01-21-21.  Patient stated that she is going to reschedule this and will notify Dr. Laurey Morale office.

## 2021-01-21 ENCOUNTER — Encounter (HOSPITAL_COMMUNITY): Admission: RE | Payer: Self-pay | Source: Home / Self Care

## 2021-01-21 ENCOUNTER — Ambulatory Visit (HOSPITAL_COMMUNITY): Admission: RE | Admit: 2021-01-21 | Payer: 59 | Source: Home / Self Care | Admitting: Gastroenterology

## 2021-01-21 SURGERY — COLONOSCOPY WITH PROPOFOL
Anesthesia: Monitor Anesthesia Care

## 2021-01-26 ENCOUNTER — Other Ambulatory Visit: Payer: Self-pay

## 2021-01-26 ENCOUNTER — Encounter (HOSPITAL_BASED_OUTPATIENT_CLINIC_OR_DEPARTMENT_OTHER): Payer: Self-pay

## 2021-01-26 ENCOUNTER — Emergency Department (HOSPITAL_BASED_OUTPATIENT_CLINIC_OR_DEPARTMENT_OTHER)
Admission: EM | Admit: 2021-01-26 | Discharge: 2021-01-26 | Disposition: A | Discharge status: Home / Self Care | Payer: 59 | Attending: Emergency Medicine | Admitting: Emergency Medicine

## 2021-01-26 DIAGNOSIS — H6091 Unspecified otitis externa, right ear: Secondary | ICD-10-CM | POA: Insufficient documentation

## 2021-01-26 DIAGNOSIS — Z8616 Personal history of COVID-19: Secondary | ICD-10-CM | POA: Diagnosis not present

## 2021-01-26 DIAGNOSIS — H60501 Unspecified acute noninfective otitis externa, right ear: Secondary | ICD-10-CM

## 2021-01-26 DIAGNOSIS — H9201 Otalgia, right ear: Secondary | ICD-10-CM | POA: Diagnosis present

## 2021-01-26 MED ORDER — CIPROFLOXACIN-DEXAMETHASONE 0.3-0.1 % OT SUSP: 4.0000 [drp] | Freq: Two times a day (BID) | OTIC | 0 refills | Status: DC

## 2021-01-26 NOTE — ED Triage Notes (Signed)
Pt arrives ambulatory to ED with c/o pain to right ear states it feels swollen and reports she can not hear out of it, with pain radiating into right face/ throat.

## 2021-01-26 NOTE — Discharge Instructions (Addendum)
Please use your antibiotic drops as prescribed.  4 drops, 2 times a day.  You may use ibuprofen and Tylenol for pain control.  Please read the information about your ear infection attached to these discharge papers.  Limit your headphone use and keep your ear as dry as you can.  It was a pleasure to meet you and I hope that you feel better.

## 2021-01-26 NOTE — ED Provider Notes (Signed)
[facility] EMERGENCY DEPARTMENT Provider Note   CSN: [id number] Arrival date & time: 01/26/21  2831     History Chief Complaint  Patient presents with   Otalgia    Christina Castro is a 50 y.o. female past medical history GERD presenting today with a complaint of right ear pain.  Patient reports that 3 days ago she began to experience pain around her right ear.  She feels as though the pain extends down her face.  Has not noticed any discharge.  Feels as though there have been bumps inside of her ear which she has tried to scratch.  Has noticed that her right ear is frequently dry.  Denies swimming or recent travel however endorses large headphone use throughout the workday.  No symptoms of her left ear.  No fevers or chills.  No trismus.  Patient not diabetic.   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    Depression    "S/P losing my daughter d/t rejection post lung transplant"   GERD (gastroesophageal reflux disease)    Headache(784.0)    "related to starting my periods" (09/11/2013)    Patient Active Problem List   Diagnosis Date Noted   Acute pyelonephritis 12/12/2020   Pyelonephritis 12/11/2020   Leukocytosis 12/11/2020   Bilateral leg edema 12/11/2020   Sepsis (HCC) 12/11/2020   History of COVID-19 11/19/2020   Snoring 06/25/2020   Pneumonia due to COVID-19 virus 05/24/2020   Severe sepsis (HCC) 05/24/2020   Acute hypoxemic respiratory failure (HCC) 05/24/2020   Diarrhea 05/24/2020   Incisional hernia with obstruction 09/11/2013   Incisional hernia, without obstruction or gangrene 06/27/2013   Morbid obesity (HCC) 06/27/2013    Past Surgical History:  Procedure Laterality Date   CESAREAN SECTION  1993; 1997; 1998   CHOLECYSTECTOMY  1993   w/UHR   CYST EXCISION  1985   "end of my spine"   GASTRIC BYPASS  1999   HERNIA REPAIR     INSERTION OF MESH N/A 09/11/2013   Procedure: INSERTION OF MESH;  Surgeon: Ernestene Mention,  MD;  Location: [facility] OR;  Service: General;  Laterality: N/A;   LAPAROSCOPIC INCISIONAL / UMBILICAL / VENTRAL HERNIA REPAIR  09/11/2013   VHR w/mesh and LOA    LYSIS OF ADHESION N/A 09/11/2013   Procedure: LAPAROSCOPIC LYSIS OF ADHESION;  Surgeon: Ernestene Mention, MD;  Location: [facility] OR;  Service: General;  Laterality: N/A;   TONSILLECTOMY  1972   TUBAL LIGATION  1998   UMBILICAL HERNIA REPAIR  1993   w/chole   VENTRAL HERNIA REPAIR N/A 09/11/2013   Procedure: LAPAROSCOPIC VENTRAL HERNIA REPAIR ;  Surgeon: Ernestene Mention, MD;  Location: [facility] OR;  Service: General;  Laterality: N/A;     OB History   No obstetric history on file.     Family History  Problem Relation Age of Onset   Cancer Mother        breast   Cancer Father        prostate   Cancer Sister        ovarian   Cancer Paternal Uncle        prostate   Cancer Maternal Grandmother        breast   Cancer Paternal Grandfather        prostate   Cancer Sister        ovarian    Social History   Tobacco Use   Smoking  status: Never   Smokeless tobacco: Never  Vaping Use   Vaping Use: Never used  Substance Use Topics   Alcohol use: Yes    Comment: 09/11/2013 "glass of wine/month maybe"   Drug use: No    Home Medications Prior to Admission medications   Medication Sig Start Date End Date Taking? Authorizing Provider  CHLOROPHYLL PO Take 15 mLs by mouth daily. With water.    [provider]  CINNAMON PO Take 1 tablet by mouth 2 (two) times daily.    [provider]  cyclobenzaprine (FLEXERIL) 10 MG tablet Take 10 mg by mouth daily. 09/13/20   [provider]  ergocalciferol (VITAMIN D2) 1.25 MG (50000 UT) capsule Take 50,000 Units by mouth every Monday.    [provider]  furosemide (LASIX) 40 MG tablet Take 40 mg by mouth 2 (two) times daily. 12/07/20   [provider]  Ginger, Zingiber officinalis, (GINGER ROOT PO) Take 1 tablet by mouth 2 (two) times daily.    [provider]  Multiple Vitamins-Minerals (WOMENS MULTI GUMMIES PO) Take 1 tablet by mouth daily.    [provider]  pantoprazole (PROTONIX) 40 MG tablet Take 1 tablet (40 mg total) by mouth daily. 09/30/20   Mannam, Colbert Coyer, MD  sertraline (ZOLOFT) 50 MG tablet Take 50 mg by mouth daily.  07/04/13 04/10/20  [provider]    Allergies    Patient has no known allergies.  Review of Systems   Review of Systems  Constitutional:  Negative for chills and fever.  HENT:  Positive for ear pain. Negative for congestion, ear discharge, sinus pain, sore throat and trouble swallowing.   Eyes:  Negative for pain.  All other systems reviewed and are negative.  Physical Exam Updated Vital Signs BP 139/67 (BP Location: Right Arm)   Pulse (!) 51   Temp 98.3 F (36.8 C) (Oral)   Resp 18   Ht 5\' 3"  (1.6 m)   Wt 132.5 kg   LMP 08/07/2013   SpO2 97%   BMI 51.73 kg/m   Physical Exam Vitals and nursing note reviewed.  Constitutional:      Appearance: Normal appearance.  HENT:     Head: Normocephalic and atraumatic.     Left Ear: Tympanic membrane, ear canal and external ear normal.     Ears:     Comments: Auricle and tragal tenderness.  Inflammation and redness noted to the external ear.  Tympanic membrane difficult to visualize due to inflammation, however does not appear to be ruptured.  No discharge in canal.  No mastoid tenderness.  Flaky dry skin noted on ear.    Nose: Nose normal.     Mouth/Throat:     Mouth: Mucous membranes are moist.     Pharynx: Oropharynx is clear.  Eyes:     General: No scleral icterus.    Extraocular Movements: Extraocular movements intact.     Conjunctiva/sclera: Conjunctivae normal.     Pupils: Pupils are equal, round, and reactive to light.  Pulmonary:     Effort: Pulmonary effort is normal. No respiratory distress.  Musculoskeletal:     Cervical back: Normal range of motion and neck supple.  Skin:    General: Skin is warm and dry.      Findings: No rash.  Neurological:     Mental Status: She is alert.  Psychiatric:        Mood and Affect: Mood normal.        Behavior: Behavior  normal.    ED Results / Procedures / Treatments   Labs (all labs ordered are listed, but only abnormal results are displayed) Labs Reviewed - No data to display  EKG None  Radiology No results found.  Procedures Procedures   Medications Ordered in ED Medications - No data to display  ED Course  I have reviewed the triage vital signs and the nursing notes.  Pertinent labs & imaging results that were available during my care of the patient were reviewed by me and considered in my medical decision making (see chart for details).    MDM Rules/Calculators/A&P  Christina Castro is a 50 y.o. female past medical history GERD presenting today with a complaint of right ear pain.  Patient reports that 3 days ago she began to experience pain around her right ear.  She feels as though the pain extends down her face.  Has not noticed any discharge.  Feels as though there have been bumps inside of her ear which she has tried to scratch.  Has noticed that her right ear is frequently dry.  Denies swimming or recent travel however endorses large headphone use throughout the workday.  No symptoms of her left ear.  No fevers or chills.  No trismus.  Patient not diabetic.  No canal occlusion, Pt afebrile in NAD. Exam non concerning for mastoiditis, cellulitis or malignant OE.  I have sent Ciprodex drops to her pharmacy.  Patient will follow up with her primary care provider if this is not resolving over the next 3 days.  Patient has been understanding of the plan.  She will limit her head phone use and return as discussed.  Information also attached to her discharge papers.  Ambulatory and stable for discharge.  Final Clinical Impression(s) / ED Diagnoses Final diagnoses:  Acute otitis externa of right ear, unspecified type    Rx / DC Orders Results and  diagnoses were explained to the patient. Return precautions discussed in full. Patient had no additional questions and expressed complete understanding.     Woodroe Chen 01/26/21 1106    Alvira Monday, MD 01/27/21 [phone]

## 2021-02-19 ENCOUNTER — Ambulatory Visit (INDEPENDENT_AMBULATORY_CARE_PROVIDER_SITE_OTHER): Payer: 59 | Admitting: Nurse Practitioner

## 2021-02-19 ENCOUNTER — Ambulatory Visit: Payer: 59

## 2021-02-19 ENCOUNTER — Ambulatory Visit: Payer: 59 | Admitting: Nurse Practitioner

## 2021-02-19 ENCOUNTER — Encounter: Payer: Self-pay | Admitting: Nurse Practitioner

## 2021-02-19 DIAGNOSIS — Z8616 Personal history of COVID-19: Secondary | ICD-10-CM

## 2021-02-19 NOTE — Progress Notes (Signed)
Virtual Visit via Telephone Note  I connected with Christina Castro on 02/19/21 at  9:40 AM EDT by telephone and verified that I am speaking with the correct person using two identifiers.  Location: Patient: home Provider: office   I discussed the limitations, risks, security and privacy concerns of performing an evaluation and management service by telephone and the availability of in person appointments. I also discussed with the patient that there may be a patient responsible charge related to this service. The patient expressed understanding and agreed to proceed.   History of Present Illness:  Patient presents today for post-COVID care clinic visit.  Patient was last seen in this office on 11/20/2018.  This is a televisit today.  Patient states that overall she has been doing well.  She has been following up with bariatric doctor.  She has been keeping appointments with titration, exercise PT, weight management.  She is currently on a meal plan replacement with shakes.  She has continued to lose weight.  Patient is still awaiting CPAP.  We discussed that this will help also with energy throughout the day.  Overall she is much improved. Denies f/c/s, n/v/d, hemoptysis, PND, chest pain or edema.      Observations/Objective:  Vitals with BMI 01/26/2021 12/15/2020 12/15/2020  Height 5\' 3"  - -  Weight 292 lbs - -  BMI 51.74 - -  Systolic 139 129  Diastolic 67 78 68  Pulse 51 49 72      Assessment and Plan:  History of COVID-19  Stay well hydrated   Stay active   Deep breathing exercises     Continue to follow with pulmonary      GERD:   -Protonix   Sleep apnea:   Awaiting CPAP   Fatigue:   Continue exercises     Follow up:   Follow up if needed    I discussed the assessment and treatment plan with the patient. The patient was provided an opportunity to ask questions and all were answered. The patient agreed with the plan and demonstrated an understanding of  the instructions.   The patient was advised to call back or seek an in-person evaluation if the symptoms worsen or if the condition fails to improve as anticipated.  I provided 23 minutes of non-face-to-face time during this encounter.   387, NP

## 2021-02-19 NOTE — Patient Instructions (Signed)
History of COVID-19  Stay well hydrated   Stay active   Deep breathing exercises     Continue to follow with pulmonary      GERD:   -Protonix   Sleep apnea:   Awaiting CPAP   Fatigue:   Continue exercises     Follow up:   Follow up if needed

## 2021-06-30 ENCOUNTER — Other Ambulatory Visit: Payer: Self-pay | Admitting: Pulmonary Disease

## 2022-03-08 IMAGING — CT CT ABD-PELV W/ CM
2 of 5 series · 16 of 46 positions shown, 18 images · IV contrast (APPLIED)
Comparison: 07/06/2013

CLINICAL DATA: Abdominal pain, fever

EXAM:
CT ABDOMEN AND PELVIS WITH CONTRAST
TECHNIQUE: Multidetector CT imaging of the abdomen and pelvis was performed
using the standard protocol following bolus administration of
intravenous contrast.
CONTRAST:  100mL OMNIPAQUE IOHEXOL 350 MG/ML SOLN

[Series 3: abdomen 5.0 · axial · 0.98mm/px · z∈[-456,-11]mm · 13 of 105 slices shown, 15 images]
[im 8/105  soft-tissue]
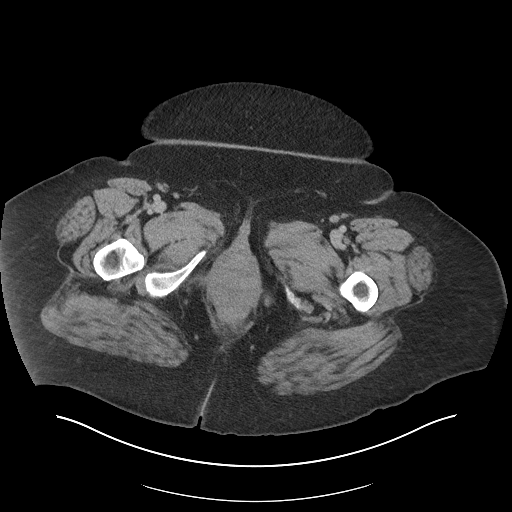
[im 8/105  bone]
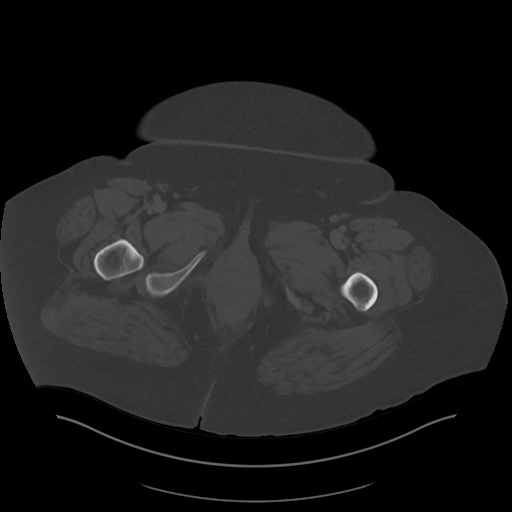
[im 15/105  soft-tissue]
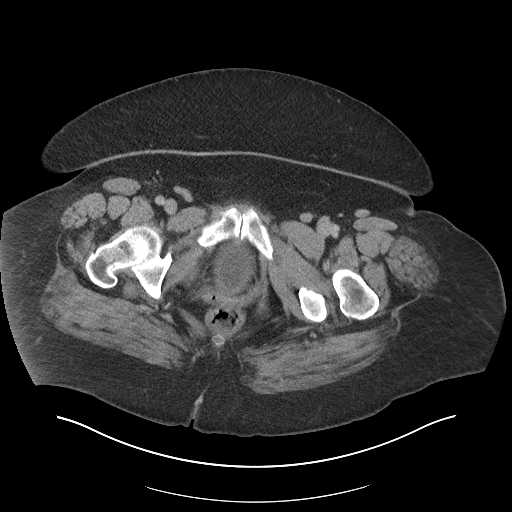
[im 23/105  soft-tissue]
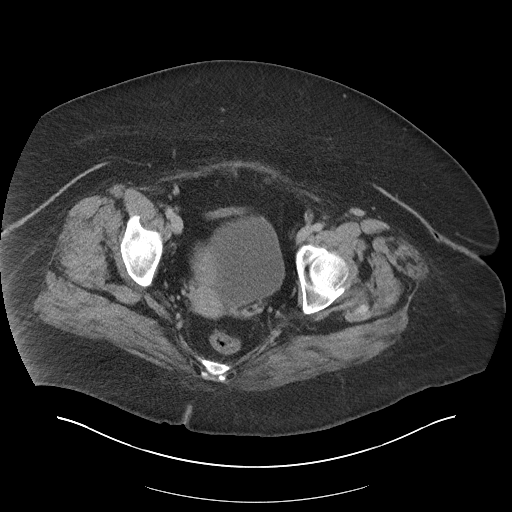
[im 30/105  soft-tissue]
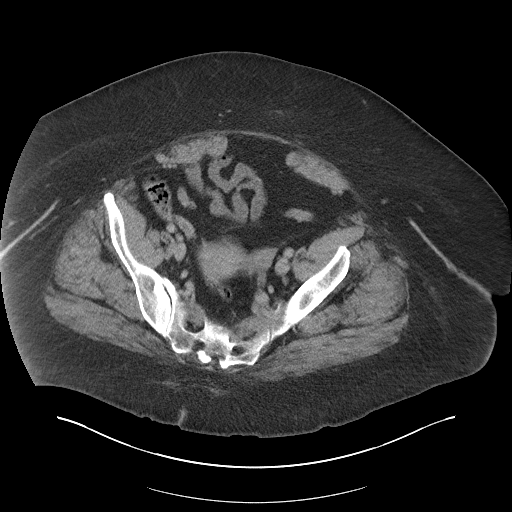
[im 38/105  soft-tissue]
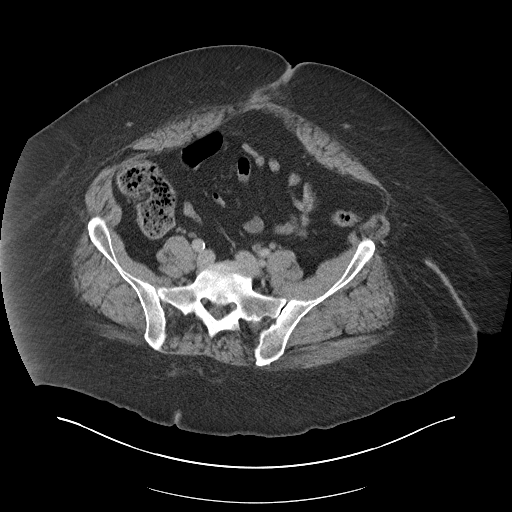
[im 45/105  soft-tissue]
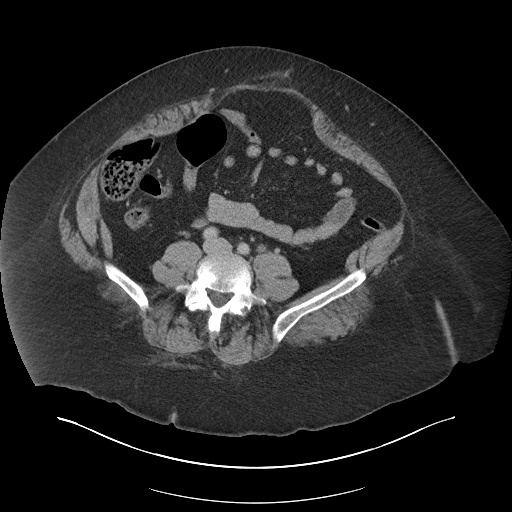
[im 53/105  soft-tissue]
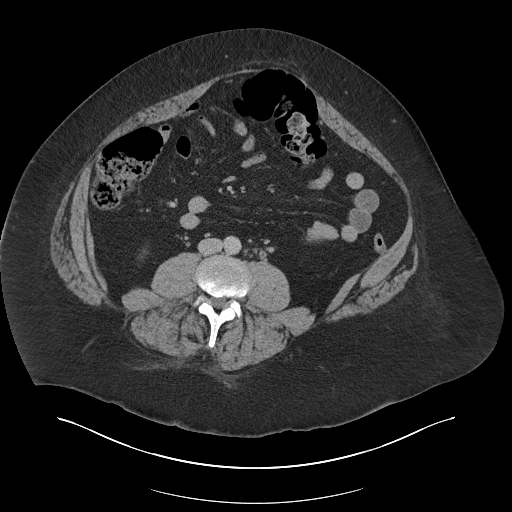
[im 60/105  soft-tissue]
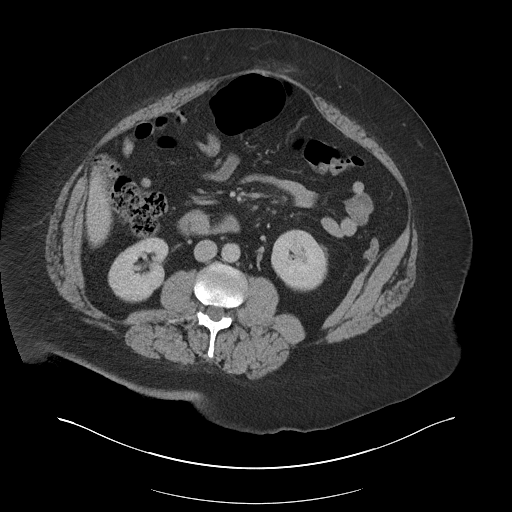
[im 67/105  soft-tissue]
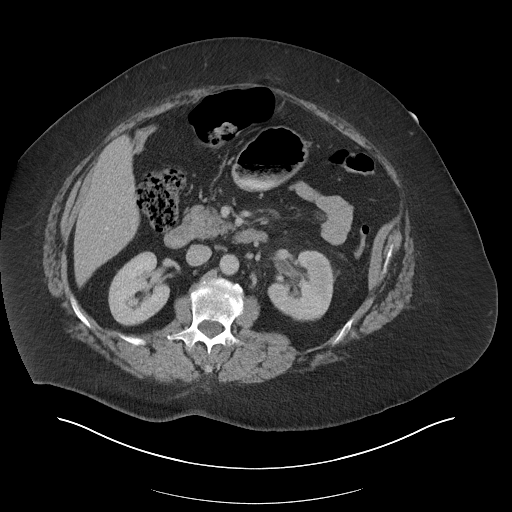
[im 67/105  bone]
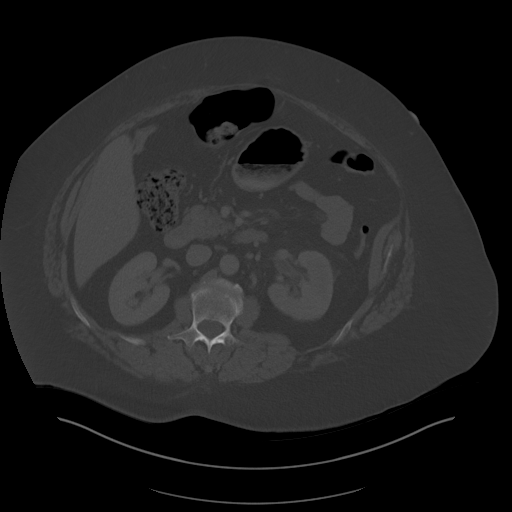
[im 75/105  soft-tissue]
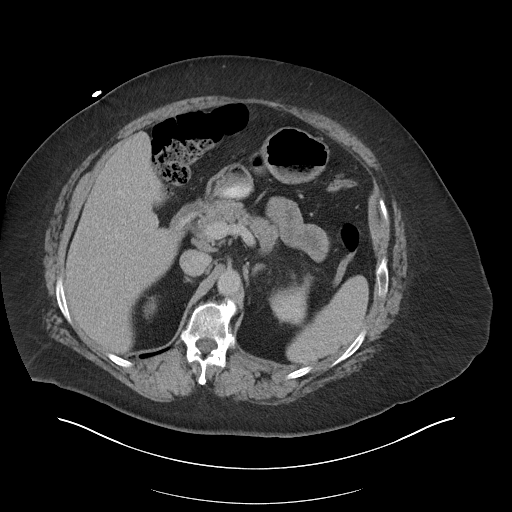
[im 82/105  soft-tissue]
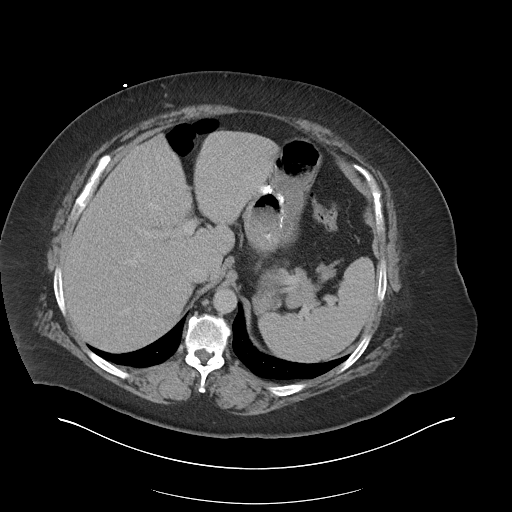
[im 90/105  soft-tissue]
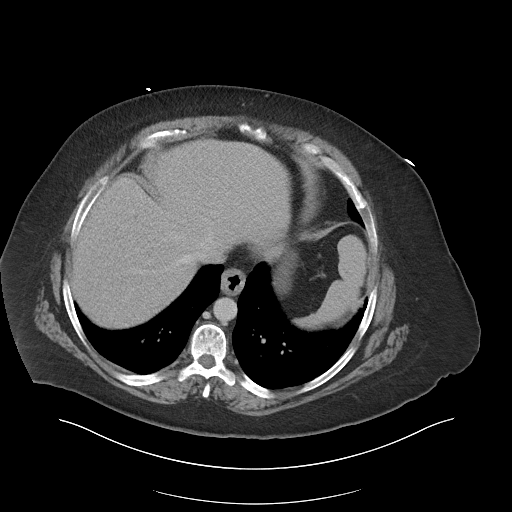
[im 97/105  soft-tissue]
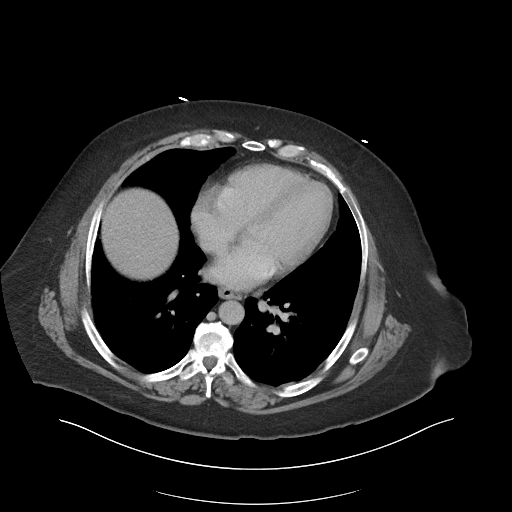

[Series 6: abdomen 3.0 mpr cor · coronal · 0.92mm/px · 3 of 126 slices shown]
[im 42/126  soft-tissue]
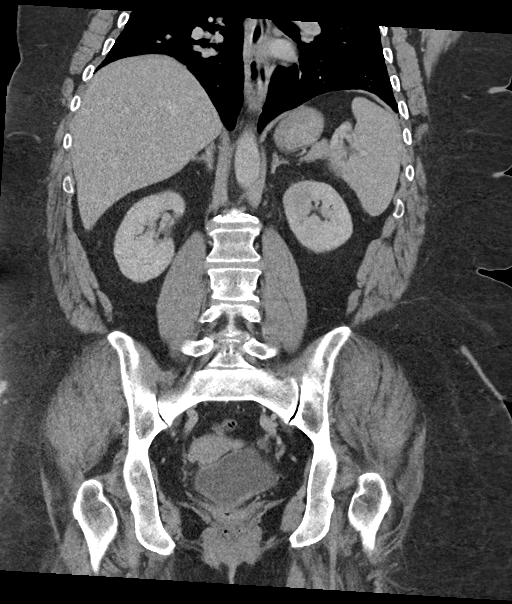
[im 56/126  soft-tissue]
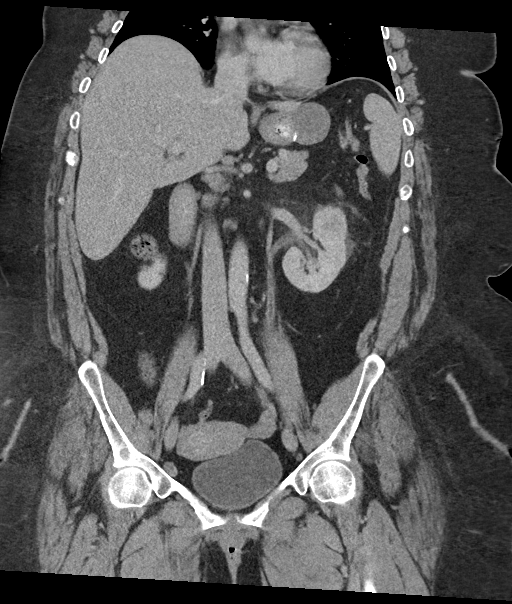
[im 70/126  soft-tissue]
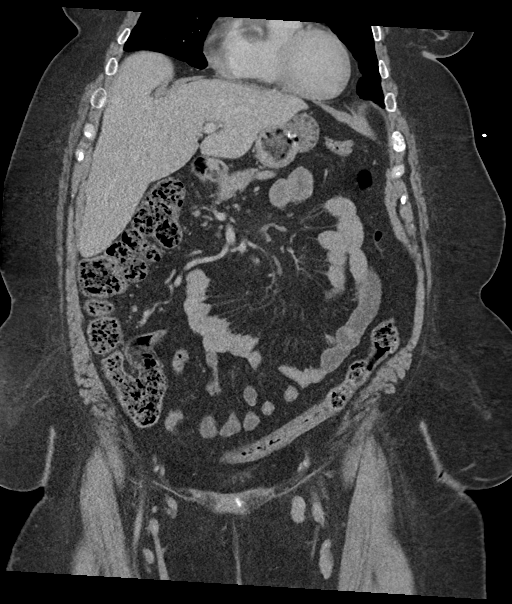

[16 of 46 positions shown; findings below may reference images not displayed]

FINDINGS: Lower chest: Lung bases are clear. No effusions. Heart is normal
size.

Hepatobiliary: No focal liver abnormality is seen. Status post
cholecystectomy. No biliary dilatation.

Pancreas: No focal abnormality or ductal dilatation.

Spleen: No focal abnormality.  Normal size.

Adrenals/Urinary Tract: Decreased enhancement throughout the left
kidney with stranding around the left kidney. There appears to be
enhancement within the wall of the renal pelvis and ureter. Findings
concerning for possible pyelonephritis. No hydronephrosis. Right
kidney and adrenal glands unremarkable. Urinary bladder grossly
unremarkable.

Stomach/Bowel: Stomach, large and small bowel grossly unremarkable.

Vascular/Lymphatic: No evidence of aneurysm or adenopathy.

Reproductive: Probable small mass. Fibroids in the uterus. No
adnexal

Other: No free fluid or free air.

Musculoskeletal: No acute bony abnormality.
IMPRESSION: Decreased enhancement throughout the left kidney with perinephric
stranding. Findings concerning for left pyelonephritis.

## 2022-03-27 IMAGING — US US LIVER ELASTOGRAPHY
1 series · 12 of 25 positions shown · non-contrast
Comparison: CT abdomen and pelvis 12/11/2020

CLINICAL DATA: Fatty liver

EXAM:
US LIVER ELASTOGRAPHY
TECHNIQUE: Sonography of the liver was performed. In addition, ultrasound
elastography evaluation of the liver was performed. A region of
interest was placed within the right lobe of the liver. Following
application of a compressive sonographic pulse, tissue
compressibility was assessed. Multiple assessments were performed at
the selected site. Median tissue compressibility was determined.
Previously, hepatic stiffness was assessed by shear wave velocity.
Based on recently published Society of Radiologists in Ultrasound
consensus article, reporting is now recommended to be performed in
the SI units of pressure (kiloPascals) representing hepatic
stiffness/elasticity. The obtained result is compared to the
published reference standards. (cACLD = compensated Advanced Chronic
Liver Disease)

[Series 1: us elastography liver · 12 of 47 slices shown]
[im 2/47]
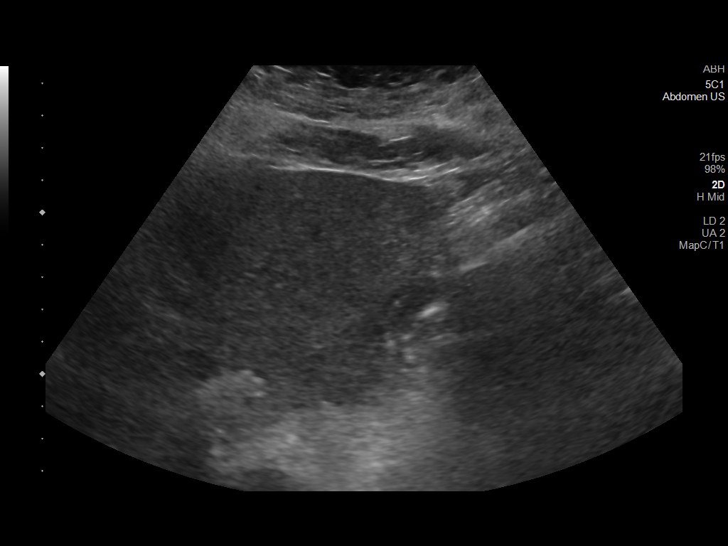
[im 6/47]
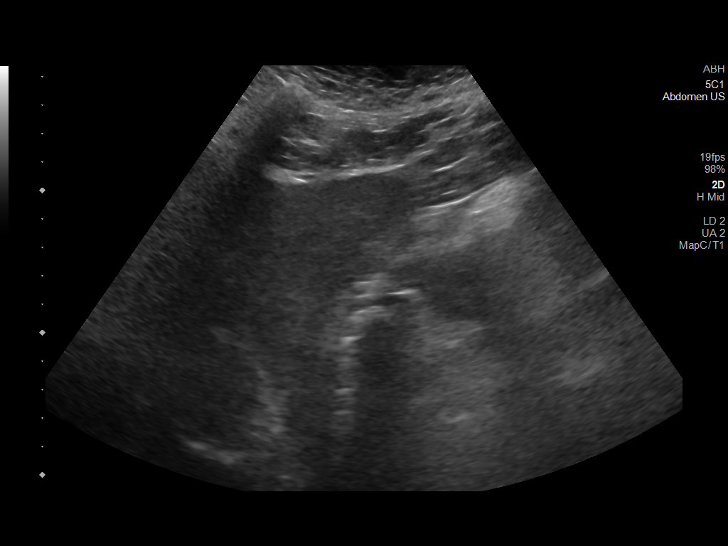
[im 10/47]
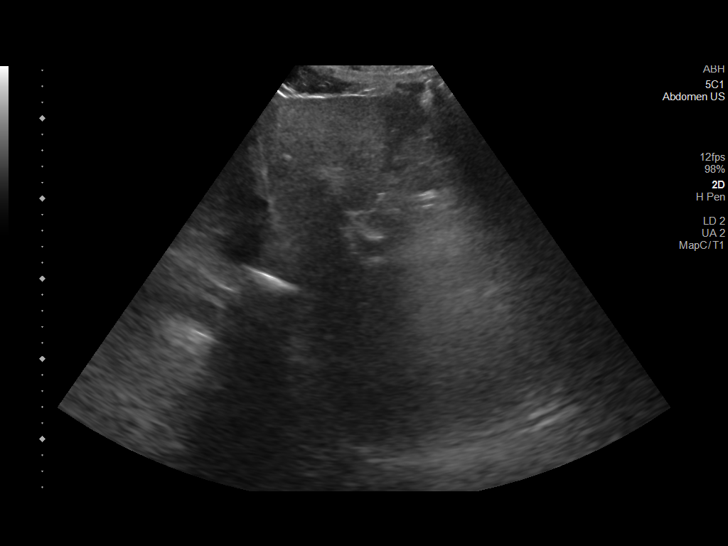
[im 14/47]
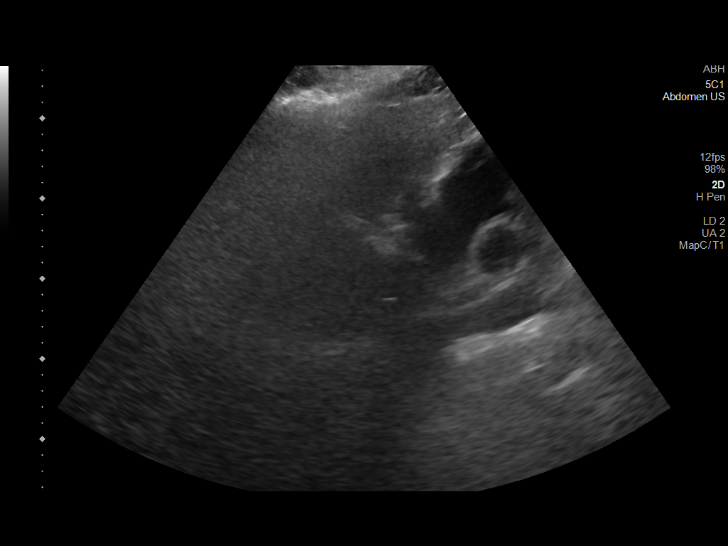
[im 18/47]
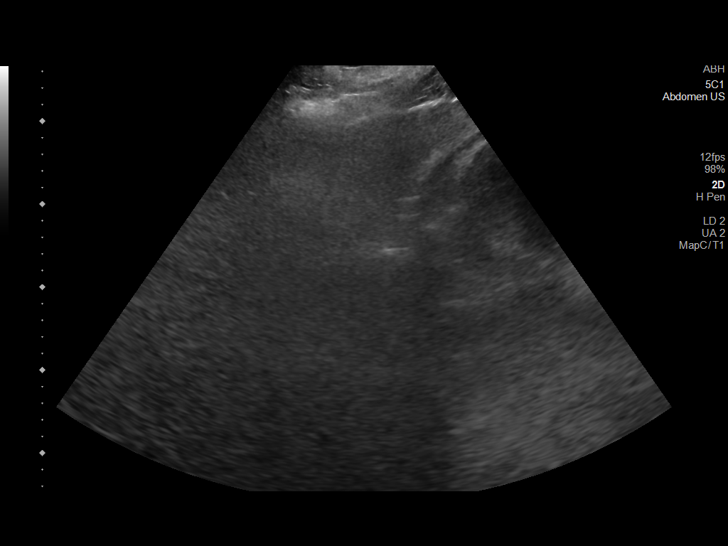
[im 22/47]
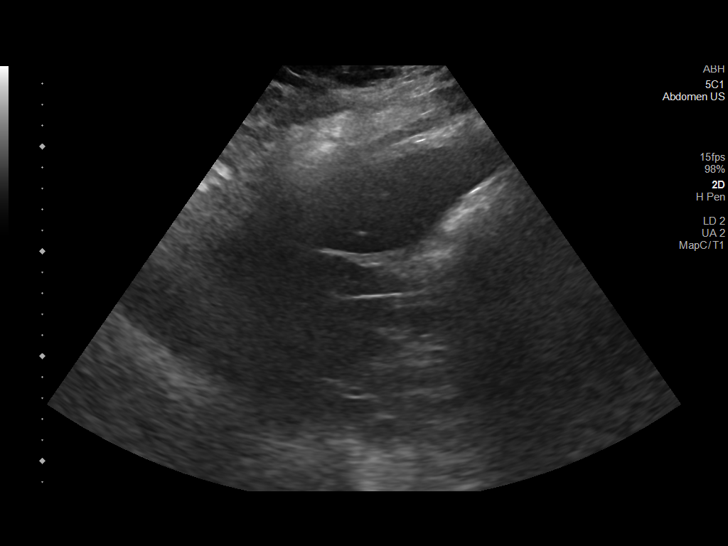
[im 25/47]
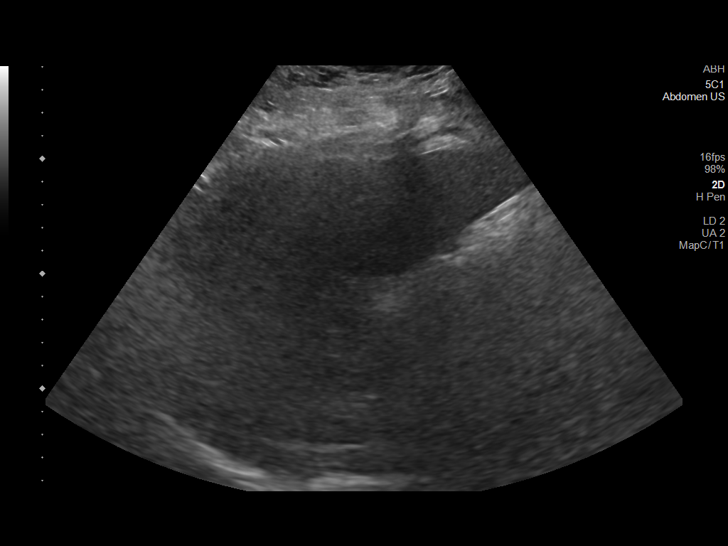
[im 29/47]
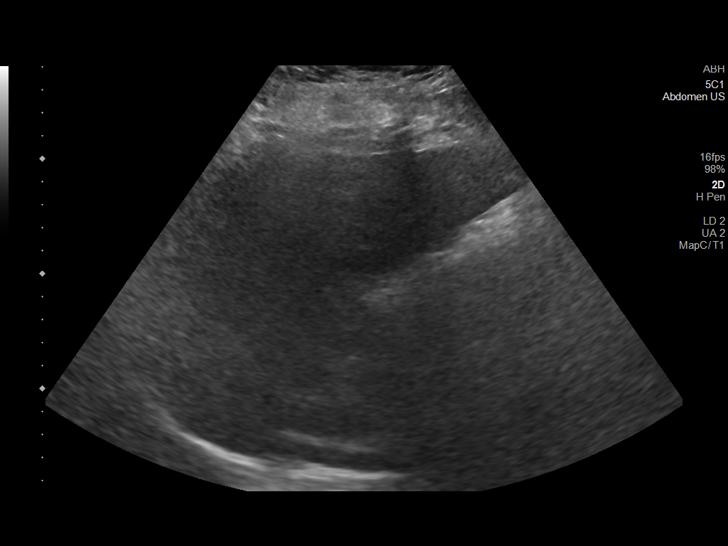
[im 33/47]
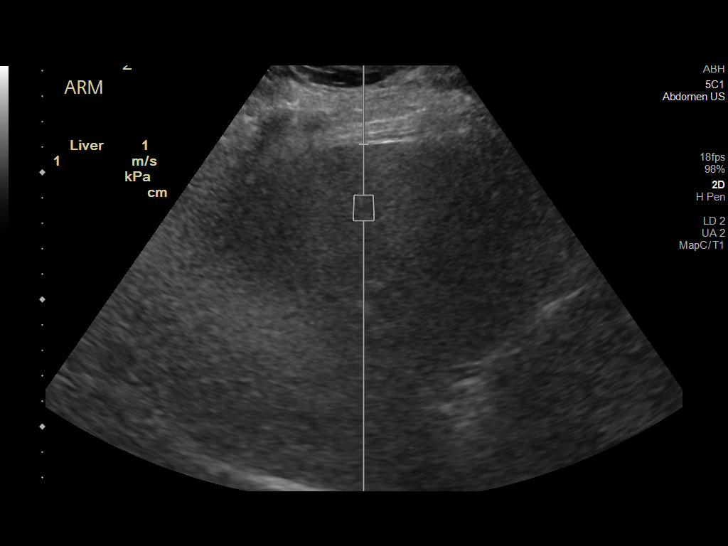
[im 37/47]
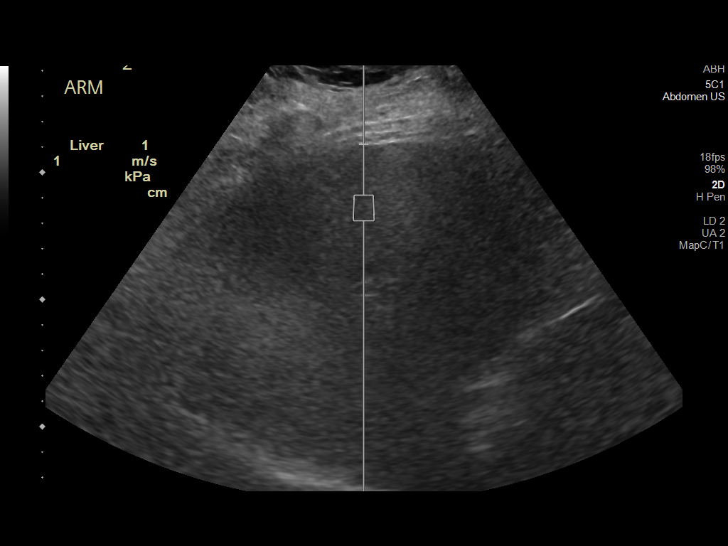
[im 41/47]
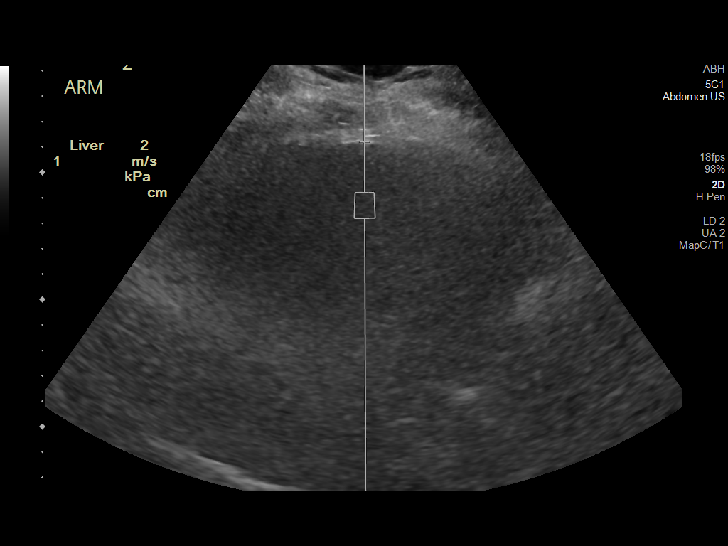
[im 45/47]
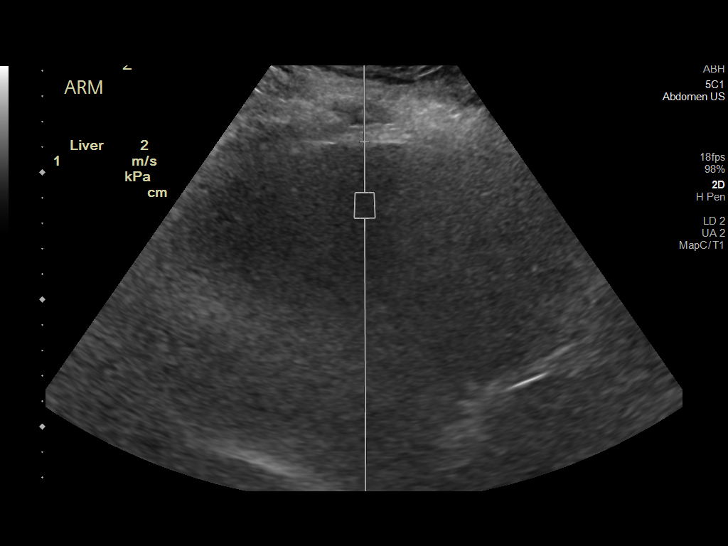

[12 of 25 positions shown; findings below may reference images not displayed]

FINDINGS: Liver: Heterogeneous increased echogenicity with poor sound
penetration. Nodular capsule. Findings suggest cirrhosis. No
discrete mass identified though assessment of intrahepatic detail is
limited secondary to sound attenuation. Portal vein is patent on
color Doppler imaging with normal direction of blood flow towards
the liver.

ULTRASOUND HEPATIC ELASTOGRAPHY

Device: Siemens Helix VTQ

Patient position: Supine

Transducer: [id number]

Number of measurements: 10

Hepatic segment:  8

Median kPa:

IQR:

IQR/Median kPa ratio:

Data quality: IQR/Median kPa ratio of 0.3 or greater indicates
reduced accuracy

Diagnostic category: > or =17 kPa: highly suggestive of cACLD with
an increased probability of clinically significant portal
hypertension

The use of hepatic elastography is applicable to patients with viral
hepatitis and non-alcoholic fatty liver disease. At this time, there
is insufficient data for the referenced cut-off values and use in
other causes of liver disease, including alcoholic liver disease.
Patients, however, may be assessed by elastography and serve as
their own reference standard/baseline.

In patients with non-alcoholic liver disease, the values suggesting
compensated advanced chronic liver disease (cACLD) may be lower, and
patients may need additional testing with elasticity results of [DATE]
kPa.

Please note that abnormal hepatic elasticity and shear wave
velocities may also be identified in clinical settings other than
with hepatic fibrosis, such as: acute hepatitis, elevated right
heart and central venous pressures including use of beta blockers,
Esz disease (Evonny), infiltrative processes such as
mastocytosis/amyloidosis/infiltrative tumor/lymphoma, extrahepatic
cholestasis, with hyperemia in the post-prandial state, and with
liver transplantation. Correlation with patient history, laboratory
data, and clinical condition recommended.

Diagnostic Categories:

< or =5 kPa: high probability of being normal

< or =9 kPa: in the absence of other known clinical signs, rules [DATE] kPa and ?13 kPa: suggestive of cACLD, but needs further testing

>13 kPa: highly suggestive of cACLD

> or =17 kPa: highly suggestive of cACLD with an increased
probability of clinically significant portal hypertension
IMPRESSION: ULTRASOUND LIVER:

Increased hepatic echogenicity with slightly nodular hepatic contour
suggesting cirrhosis.

No gross intrahepatic abnormalities are identified though
intrahepatic detail is limited secondary to sound attenuation; no
intrahepatic mass lesion was seen by recent CT.

ULTRASOUND HEPATIC ELASTOGRAPHY:

Median kPa:

Diagnostic category: > or =17 kPa: highly suggestive of cACLD with
an increased probability of clinically significant portal
hypertension

## 2022-05-25 ENCOUNTER — Other Ambulatory Visit (HOSPITAL_COMMUNITY): Payer: Self-pay

## 2022-05-25 MED ORDER — PHENTERMINE HCL 37.5 MG PO TABS
37.5000 mg | ORAL_TABLET | Freq: Every day | ORAL | 0 refills | Status: DC
  Filled 2022-05-25: qty 30, 30d supply, fill #0

## 2022-05-25 MED ORDER — WEGOVY 0.25 MG/0.5ML ~~LOC~~ SOAJ
0.2500 mg | SUBCUTANEOUS | 0 refills | Status: DC
  Filled 2022-05-25 – 2022-09-24 (×2): qty 2, 28d supply, fill #0

## 2022-06-13 ENCOUNTER — Other Ambulatory Visit (HOSPITAL_COMMUNITY): Payer: Self-pay

## 2022-06-13 MED ORDER — WEGOVY 0.25 MG/0.5ML ~~LOC~~ SOAJ
SUBCUTANEOUS | 0 refills | Status: DC
  Filled 2022-06-13 – 2022-09-24 (×2): qty 2, 28d supply, fill #0

## 2022-06-15 ENCOUNTER — Other Ambulatory Visit (HOSPITAL_COMMUNITY): Payer: Self-pay

## 2022-06-15 MED ORDER — PHENTERMINE HCL 37.5 MG PO TABS
37.5000 mg | ORAL_TABLET | Freq: Every day | ORAL | 0 refills | Status: DC
  Filled 2022-06-15 – 2022-09-28 (×3): qty 30, 30d supply, fill #0

## 2022-06-18 ENCOUNTER — Other Ambulatory Visit (HOSPITAL_COMMUNITY): Payer: Self-pay

## 2022-08-31 ENCOUNTER — Other Ambulatory Visit: Payer: Self-pay

## 2022-08-31 ENCOUNTER — Other Ambulatory Visit (HOSPITAL_COMMUNITY): Payer: Self-pay

## 2022-08-31 MED ORDER — PHENTERMINE HCL 37.5 MG PO TABS
37.5000 mg | ORAL_TABLET | Freq: Every day | ORAL | 0 refills | Status: AC
  Filled 2022-08-31: qty 30, 30d supply, fill #0

## 2022-08-31 MED ORDER — WEGOVY 0.25 MG/0.5ML ~~LOC~~ SOAJ
0.2500 mg | SUBCUTANEOUS | 0 refills | Status: DC
  Filled 2022-08-31: qty 2, 28d supply, fill #0

## 2022-09-01 ENCOUNTER — Other Ambulatory Visit (HOSPITAL_COMMUNITY): Payer: Self-pay

## 2022-09-24 ENCOUNTER — Other Ambulatory Visit (HOSPITAL_COMMUNITY): Payer: Self-pay

## 2022-09-24 ENCOUNTER — Other Ambulatory Visit: Payer: Self-pay

## 2022-09-25 ENCOUNTER — Other Ambulatory Visit (HOSPITAL_COMMUNITY): Payer: Self-pay

## 2022-09-28 ENCOUNTER — Other Ambulatory Visit: Payer: Self-pay

## 2022-09-29 ENCOUNTER — Other Ambulatory Visit (HOSPITAL_COMMUNITY): Payer: Self-pay

## 2022-09-29 MED ORDER — PHENTERMINE HCL 37.5 MG PO TABS
37.5000 mg | ORAL_TABLET | Freq: Every day | ORAL | 0 refills | Status: AC
  Filled 2022-09-29: qty 30, 30d supply, fill #0

## 2022-09-29 MED ORDER — WEGOVY 0.5 MG/0.5ML ~~LOC~~ SOAJ
0.5000 mg | SUBCUTANEOUS | 0 refills | Status: DC
  Filled 2022-09-29 – 2022-11-24 (×2): qty 2, 28d supply, fill #0

## 2022-09-29 MED ORDER — WEGOVY 0.5 MG/0.5ML ~~LOC~~ SOAJ
0.5000 mg | SUBCUTANEOUS | 0 refills | Status: DC
  Filled 2022-09-29 – 2022-10-26 (×2): qty 2, 28d supply, fill #0

## 2022-10-13 ENCOUNTER — Other Ambulatory Visit (HOSPITAL_COMMUNITY): Payer: Self-pay

## 2022-10-26 ENCOUNTER — Other Ambulatory Visit (HOSPITAL_COMMUNITY): Payer: Self-pay

## 2022-11-02 ENCOUNTER — Other Ambulatory Visit (HOSPITAL_COMMUNITY): Payer: Self-pay

## 2022-11-02 MED ORDER — PHENTERMINE HCL 37.5 MG PO TABS
37.5000 mg | ORAL_TABLET | Freq: Every day | ORAL | 0 refills | Status: AC
  Filled 2022-11-02 – 2022-12-31 (×3): qty 30, 30d supply, fill #0

## 2022-11-04 ENCOUNTER — Other Ambulatory Visit (HOSPITAL_COMMUNITY): Payer: Self-pay

## 2022-11-12 ENCOUNTER — Other Ambulatory Visit (HOSPITAL_COMMUNITY): Payer: Self-pay

## 2022-11-24 ENCOUNTER — Other Ambulatory Visit (HOSPITAL_COMMUNITY): Payer: Self-pay

## 2022-12-31 ENCOUNTER — Other Ambulatory Visit: Payer: Self-pay

## 2022-12-31 ENCOUNTER — Other Ambulatory Visit (HOSPITAL_COMMUNITY): Payer: Self-pay

## 2023-09-22 ENCOUNTER — Encounter (HOSPITAL_COMMUNITY): Payer: Self-pay

## 2023-09-22 ENCOUNTER — Ambulatory Visit (HOSPITAL_COMMUNITY)
Admission: EM | Admit: 2023-09-22 | Discharge: 2023-09-22 | Disposition: A | Discharge status: Home / Self Care | Attending: Physician Assistant | Admitting: Physician Assistant

## 2023-09-22 DIAGNOSIS — H60312 Diffuse otitis externa, left ear: Secondary | ICD-10-CM

## 2023-09-22 DIAGNOSIS — H6122 Impacted cerumen, left ear: Secondary | ICD-10-CM

## 2023-09-22 MED ORDER — CIPROFLOXACIN-DEXAMETHASONE 0.3-0.1 % OT SUSP: 4.0000 [drp] | Freq: Two times a day (BID) | OTIC | 0 refills | Status: AC

## 2023-09-22 NOTE — ED Provider Notes (Signed)
 [facility]    CSN: [id number] Arrival date & time: 09/22/23  1552      History   Chief Complaint Chief Complaint  Patient presents with   Otalgia    HPI Christina Castro is a 53 y.o. female.   Patient presents today with a 2-day history of left ear pain with radiation into her face.  Reports that pain is rated 9 on a 0-10 pain scale, described as sharp, worse with manipulation of the ear, no relieving factors identified.  She has not tried any over-the-counter medication for symptom management but did apply hydroperoxide to the ears because she is concerned that there was cerumen impaction contributing to her symptoms as she had a feeling of fullness and could not hear well.  She does have a history of irritated ear canals related to headset use for her employer.  Several years ago she was using earbuds that ended up causing sores.  She has since then switched over the ear headset and wears this very loosely.  She has not put anything in the ear including Q-tips, earbuds, earplugs.  Denies any recent swimming or airplane travel.  Denies any recent antibiotic use.    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    Depression    "S/P losing my daughter d/t rejection post lung transplant"   GERD (gastroesophageal reflux disease)    Headache(784.0)    "related to starting my periods" (09/11/2013)    Patient Active Problem List   Diagnosis Date Noted   Acute pyelonephritis 12/12/2020   Pyelonephritis 12/11/2020   Leukocytosis 12/11/2020   Bilateral leg edema 12/11/2020   Sepsis (HCC) 12/11/2020   History of COVID-19 11/19/2020   Snoring 06/25/2020   Pneumonia due to COVID-19 virus 05/24/2020   Severe sepsis (HCC) 05/24/2020   Acute hypoxemic respiratory failure (HCC) 05/24/2020   Diarrhea 05/24/2020   Incisional hernia with obstruction 09/11/2013   Incisional hernia, without obstruction or gangrene 06/27/2013   Morbid obesity (HCC)  06/27/2013    Past Surgical History:  Procedure Laterality Date   CESAREAN SECTION  1993; 1997; 1998   CHOLECYSTECTOMY  1993   w/UHR   CYST EXCISION  1985   "end of my spine"   GASTRIC BYPASS  1999   HERNIA REPAIR     INSERTION OF MESH N/A 09/11/2013   Procedure: INSERTION OF MESH;  Surgeon: Levert Ready, MD;  Location: [facility];  Service: General;  Laterality: N/A;   LAPAROSCOPIC INCISIONAL / UMBILICAL / VENTRAL HERNIA REPAIR  09/11/2013   VHR w/mesh and LOA    LYSIS OF ADHESION N/A 09/11/2013   Procedure: LAPAROSCOPIC LYSIS OF ADHESION;  Surgeon: Levert Ready, MD;  Location: [facility];  Service: General;  Laterality: N/A;   TONSILLECTOMY  1972   TUBAL LIGATION  1998   UMBILICAL HERNIA REPAIR  1993   w/chole   VENTRAL HERNIA REPAIR N/A 09/11/2013   Procedure: LAPAROSCOPIC VENTRAL HERNIA REPAIR ;  Surgeon: Levert Ready, MD;  Location: [facility];  Service: General;  Laterality: N/A;    OB History   No obstetric history on file.      Home Medications    Prior to Admission medications   Medication Sig Start Date End Date Taking? Authorizing Provider  ciprofloxacin -dexamethasone  (CIPRODEX ) OTIC suspension Place 4 drops into the left ear 2 (two) times daily. 09/22/23  Yes Javarius Tsosie K, PA-C  CINNAMON PO Take 1 tablet by mouth 2 (  two) times daily.    [provider]  ergocalciferol  (VITAMIN D2) 1.25 MG (50000 UT) capsule Take 50,000 Units by mouth every Monday.    [provider]  furosemide  (LASIX ) 40 MG tablet Take 40 mg by mouth 2 (two) times daily. 12/07/20   [provider]  Ginger, Zingiber officinalis, (GINGER ROOT PO) Take 1 tablet by mouth 2 (two) times daily.    [provider]  Multiple Vitamins-Minerals (WOMENS MULTI GUMMIES PO) Take 1 tablet by mouth daily.    [provider]  pantoprazole  (PROTONIX ) 40 MG tablet TAKE 1 TABLET BY MOUTH EVERY DAY 06/30/21   Mannam, Praveen, MD  phentermine  (ADIPEX-P ) 37.5 MG tablet Take 1 tablet  by mouth daily 08/30/22     phentermine  (ADIPEX-P ) 37.5 MG tablet Take 1 tablet by mouth daily 09/27/22     phentermine  (ADIPEX-P ) 37.5 MG tablet Take 1 tablet by mouth daily 09/29/22     phentermine  (ADIPEX-P ) 37.5 MG tablet Take 1 tablet (37.5 mg total) by mouth daily. 11/01/22     sertraline  (ZOLOFT ) 50 MG tablet Take 50 mg by mouth daily.  07/04/13 04/10/20  [provider]    Family History Family History  Problem Relation Age of Onset   Cancer Mother        breast   Cancer Father        prostate   Cancer Sister        ovarian   Cancer Paternal Uncle        prostate   Cancer Maternal Grandmother        breast   Cancer Paternal Grandfather        prostate   Cancer Sister        ovarian    Social History Social History   Tobacco Use   Smoking status: Never   Smokeless tobacco: Never  Vaping Use   Vaping status: Never Used  Substance Use Topics   Alcohol use: Yes    Comment: 09/11/2013 "glass of wine/month maybe"   Drug use: No     Allergies   Patient has no known allergies.   Review of Systems Review of Systems  Constitutional:  Positive for activity change. Negative for appetite change, fatigue and fever.  HENT:  Positive for ear pain. Negative for congestion, ear discharge, sinus pressure, sneezing and sore throat.   Respiratory:  Negative for cough.   Cardiovascular:  Negative for chest pain.  Gastrointestinal:  Negative for abdominal pain, diarrhea, nausea and vomiting.  Neurological:  Negative for dizziness and headaches.     Physical Exam Triage Vital Signs ED Triage Vitals  Encounter Vitals Group     BP 09/22/23 1639 122/80     Systolic BP Percentile --      Diastolic BP Percentile --      Pulse Rate 09/22/23 1639 82     Resp 09/22/23 1639 16     Temp 09/22/23 1639 98.1 F (36.7 C)     Temp Source 09/22/23 1639 Oral     SpO2 09/22/23 1639 95 %     Weight --      Height --      Head Circumference --      Peak Flow --      Pain Score  09/22/23 1641 9     Pain Loc --      Pain Education --      Exclude from Growth Chart --    No data found.  Updated Vital  Signs BP 122/80 (BP Location: Left Arm)   Pulse 82   Temp 98.1 F (36.7 C) (Oral)   Resp 16   LMP 08/07/2013   SpO2 95%   Visual Acuity Right Eye Distance:   Left Eye Distance:   Bilateral Distance:    Right Eye Near:   Left Eye Near:    Bilateral Near:     Physical Exam Vitals reviewed.  Constitutional:      General: She is awake. She is not in acute distress.    Appearance: Normal appearance. She is well-developed. She is not ill-appearing.     Comments: Very pleasant female appears stated age in no acute distress sitting comfortably in exam room  HENT:     Head: Normocephalic and atraumatic.     Right Ear: Tympanic membrane, ear canal and external ear normal. Tympanic membrane is not perforated or bulging.     Left Ear: Swelling and tenderness present. There is impacted cerumen.     Ears:     Comments: Left ear: Small amount of soft appearing cerumen noted distal left ear canal obscuring visualization of TM.  Patient has tenderness palpation of tragus and pain with manipulation of external ear.  External auditory canal is erythematous and edematous without maceration.    Cerumen was removed with in office irrigation revealing erythematous and edematous external auditory canal with maceration.  Able to visualize TM that appears normal and intact. Cardiovascular:     Rate and Rhythm: Normal rate and regular rhythm.     Heart sounds: Normal heart sounds, S1 normal and S2 normal. No murmur heard. Pulmonary:     Effort: Pulmonary effort is normal.     Breath sounds: Normal breath sounds. No wheezing, rhonchi or rales.     Comments: Clear to auscultation bilaterally Abdominal:     Palpations: Abdomen is soft.     Tenderness: There is no abdominal tenderness.  Psychiatric:        Behavior: Behavior is cooperative.      UC Treatments / Results   Labs (all labs ordered are listed, but only abnormal results are displayed) Labs Reviewed - No data to display  EKG   Radiology No results found.  Procedures Procedures (including critical care time)  Medications Ordered in UC Medications - No data to display  Initial Impression / Assessment and Plan / UC Course  I have reviewed the triage vital signs and the nursing notes.  Pertinent labs & imaging results that were available during my care of the patient were reviewed by me and considered in my medical decision making (see chart for details).     Patient is well-appearing, afebrile, nontoxic, nontachycardic.  Cerumen infection was noted on exam and this was successfully removed with in office irrigation revealing erythematous/edematous and macerated external auditory canal with intact normal-appearing TM.  Concern for otitis externa given clinical presentation so we will start ciprofloxacin  and dexamethasone  drops.  We discussed that she is to keep her ear facing upward for a few minutes after application of the drops to allow them to completely penetrate the ear canal.  Discussed that she is to avoid use of earbuds, earplugs, Q-tips for at least the next week and avoid submerging her head in water .  She can use over-the-counter analgesics for pain relief.  Discussed that if anything worsens or changes and she develops otorrhea, increasing pain, fever, nausea, vomiting she should be seen immediately.  Strict return precautions given.  All questions were answered to patient  satisfaction.  Final Clinical Impressions(s) / UC Diagnoses   Final diagnoses:  Acute diffuse otitis externa of left ear  Impacted cerumen of left ear     Discharge Instructions      We were able to remove the wax.  Use Ciprodex  twice daily for the next 10 days.  Apply drops twice a day and keep her ear facing upward for a few minutes after applying the drops to allow to go throughout the ear canal.  Do not  put anything in the ear including earbuds, Q-tips, earplugs.  Avoid submerging your head in water .  If you have any worsening or changing symptoms including increasing pain, trouble hearing, drainage from the ear, fever, nausea, vomiting you should be seen immediately.   ED Prescriptions     Medication Sig Dispense Auth. Provider   ciprofloxacin -dexamethasone  (CIPRODEX ) OTIC suspension Place 4 drops into the left ear 2 (two) times daily. 7.5 mL Lyndia Bury K, PA-C      PDMP not reviewed this encounter.   Budd Cargo, PA-C 09/22/23 1749

## 2023-09-22 NOTE — Discharge Instructions (Signed)
 We were able to remove the wax.  Use Ciprodex  twice daily for the next 10 days.  Apply drops twice a day and keep her ear facing upward for a few minutes after applying the drops to allow to go throughout the ear canal.  Do not put anything in the ear including earbuds, Q-tips, earplugs.  Avoid submerging your head in water .  If you have any worsening or changing symptoms including increasing pain, trouble hearing, drainage from the ear, fever, nausea, vomiting you should be seen immediately.

## 2023-09-22 NOTE — ED Triage Notes (Signed)
 Patient states she began having left ear pain that radiates down in to the left side of her neck .  Patient states she has been using H2O2 in her left ear.

## 2024-02-10 ENCOUNTER — Encounter (HOSPITAL_BASED_OUTPATIENT_CLINIC_OR_DEPARTMENT_OTHER): Payer: Self-pay | Admitting: Family Medicine

## 2024-02-10 ENCOUNTER — Ambulatory Visit (HOSPITAL_BASED_OUTPATIENT_CLINIC_OR_DEPARTMENT_OTHER): Admitting: Family Medicine

## 2024-02-10 DIAGNOSIS — K7460 Unspecified cirrhosis of liver: Secondary | ICD-10-CM | POA: Insufficient documentation

## 2024-02-10 DIAGNOSIS — E785 Hyperlipidemia, unspecified: Secondary | ICD-10-CM | POA: Diagnosis not present

## 2024-02-10 DIAGNOSIS — Z1231 Encounter for screening mammogram for malignant neoplasm of breast: Secondary | ICD-10-CM

## 2024-02-10 DIAGNOSIS — I872 Venous insufficiency (chronic) (peripheral): Secondary | ICD-10-CM | POA: Diagnosis not present

## 2024-02-10 DIAGNOSIS — Z7901 Long term (current) use of anticoagulants: Secondary | ICD-10-CM | POA: Insufficient documentation

## 2024-02-10 DIAGNOSIS — K745 Biliary cirrhosis, unspecified: Secondary | ICD-10-CM | POA: Diagnosis not present

## 2024-02-10 DIAGNOSIS — R5383 Other fatigue: Secondary | ICD-10-CM

## 2024-02-10 DIAGNOSIS — K219 Gastro-esophageal reflux disease without esophagitis: Secondary | ICD-10-CM | POA: Insufficient documentation

## 2024-02-10 NOTE — Assessment & Plan Note (Signed)
 GI referral.  Await labs and f/u with her soon.
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
## 2024-02-10 NOTE — Assessment & Plan Note (Signed)
 DASH diet.  Encouraged properly fitted support stockings.

## 2024-02-10 NOTE — Assessment & Plan Note (Signed)
 Diet and exercise reviewed.  Consider another GLP-1 in the coming week.

## 2024-02-10 NOTE — Assessment & Plan Note (Signed)
 Await labs and f/u soon.

## 2024-02-11 ENCOUNTER — Other Ambulatory Visit (HOSPITAL_BASED_OUTPATIENT_CLINIC_OR_DEPARTMENT_OTHER): Payer: Self-pay | Admitting: Family Medicine

## 2024-02-11 ENCOUNTER — Ambulatory Visit (HOSPITAL_BASED_OUTPATIENT_CLINIC_OR_DEPARTMENT_OTHER): Payer: Self-pay | Admitting: Family Medicine

## 2024-02-11 LAB — COMPREHENSIVE METABOLIC PANEL WITH GFR
ALT: 27 IU/L (ref 0–32)
AST: 17 IU/L (ref 0–40)
Albumin: 4 g/dL (ref 3.8–4.9)
Alkaline Phosphatase: 77 IU/L (ref 49–135)
BUN/Creatinine Ratio: 8 — ABNORMAL LOW (ref 9–23)
BUN: 6 mg/dL (ref 6–24)
Bilirubin Total: 0.5 mg/dL (ref 0.0–1.2)
CO2: 22 mmol/L (ref 20–29)
Calcium: 9.5 mg/dL (ref 8.7–10.2)
Chloride: 104 mmol/L (ref 96–106)
Creatinine, Ser: 0.75 mg/dL (ref 0.57–1.00)
Globulin, Total: 2.5 g/dL (ref 1.5–4.5)
Glucose: 109 mg/dL — ABNORMAL HIGH (ref 70–99)
Potassium: 4.4 mmol/L (ref 3.5–5.2)
Sodium: 140 mmol/L (ref 134–144)
Total Protein: 6.5 g/dL (ref 6.0–8.5)
eGFR: 94 mL/min/1.73 (ref >59)

## 2024-02-11 LAB — CBC WITH DIFFERENTIAL/PLATELET
Basophils Absolute: 0.2 x10E3/uL (ref 0.0–0.2)
Basos: 1 %
EOS (ABSOLUTE): 0.3 x10E3/uL (ref 0.0–0.4)
Eos: 3 %
Hematocrit: 44.8 % (ref 34.0–46.6)
Hemoglobin: 14.5 g/dL (ref 11.1–15.9)
Immature Grans (Abs): 0 x10E3/uL (ref 0.0–0.1)
Immature Granulocytes: 0 %
Lymphocytes Absolute: 3 x10E3/uL (ref 0.7–3.1)
Lymphs: 26 %
MCH: 29.6 pg (ref 26.6–33.0)
MCHC: 32.4 g/dL (ref 31.5–35.7)
MCV: 91 fL (ref 79–97)
Monocytes Absolute: 1 x10E3/uL — ABNORMAL HIGH (ref 0.1–0.9)
Monocytes: 8 %
Neutrophils Absolute: 7.3 x10E3/uL — ABNORMAL HIGH (ref 1.4–7.0)
Neutrophils: 62 %
Platelets: 438 x10E3/uL (ref 150–450)
RBC: 4.9 x10E6/uL (ref 3.77–5.28)
RDW: 13.5 % (ref 11.7–15.4)
WBC: 11.8 x10E3/uL — ABNORMAL HIGH (ref 3.4–10.8)

## 2024-02-11 LAB — TSH: TSH: 2.97 u[IU]/mL (ref 0.450–4.500)

## 2024-02-11 LAB — VITAMIN B12: Vitamin B-12: 560 pg/mL (ref 232–1245)

## 2024-02-11 LAB — LIPID PANEL
Chol/HDL Ratio: 4.2 ratio (ref 0.0–4.4)
Cholesterol, Total: 154 mg/dL (ref 100–199)
HDL: 37 mg/dL — ABNORMAL LOW (ref >39)
LDL Chol Calc (NIH): 93 mg/dL (ref 0–99)
Triglycerides: 134 mg/dL (ref 0–149)
VLDL Cholesterol Cal: 24 mg/dL (ref 5–40)

## 2024-02-11 MED ORDER — WEGOVY 0.25 MG/0.5ML ~~LOC~~ SOAJ: 0.2500 mg | SUBCUTANEOUS | 0 refills | Status: DC

## 2024-02-16 ENCOUNTER — Encounter (HOSPITAL_BASED_OUTPATIENT_CLINIC_OR_DEPARTMENT_OTHER): Payer: Self-pay | Admitting: *Deleted

## 2024-02-21 ENCOUNTER — Telehealth (HOSPITAL_BASED_OUTPATIENT_CLINIC_OR_DEPARTMENT_OTHER): Payer: Self-pay | Admitting: *Deleted

## 2024-02-21 NOTE — Telephone Encounter (Signed)
 Pt. Made aware.

## 2024-02-22 ENCOUNTER — Encounter (HOSPITAL_BASED_OUTPATIENT_CLINIC_OR_DEPARTMENT_OTHER): Payer: Self-pay | Admitting: Radiology

## 2024-02-22 ENCOUNTER — Ambulatory Visit (HOSPITAL_BASED_OUTPATIENT_CLINIC_OR_DEPARTMENT_OTHER)
Admission: RE | Admit: 2024-02-22 | Discharge: 2024-02-22 | Disposition: A | Source: Ambulatory Visit | Attending: Family Medicine | Admitting: Family Medicine

## 2024-02-22 DIAGNOSIS — Z1231 Encounter for screening mammogram for malignant neoplasm of breast: Secondary | ICD-10-CM

## 2024-03-09 ENCOUNTER — Telehealth (HOSPITAL_BASED_OUTPATIENT_CLINIC_OR_DEPARTMENT_OTHER): Payer: Self-pay | Admitting: Pharmacy Technician

## 2024-03-09 ENCOUNTER — Ambulatory Visit (INDEPENDENT_AMBULATORY_CARE_PROVIDER_SITE_OTHER): Admitting: Family Medicine

## 2024-03-09 ENCOUNTER — Encounter (HOSPITAL_BASED_OUTPATIENT_CLINIC_OR_DEPARTMENT_OTHER): Payer: Self-pay | Admitting: Family Medicine

## 2024-03-09 VITALS — BP 127/82 | HR 70 | Temp 97.6°F | Resp 16 | Wt 316.8 lb

## 2024-03-09 DIAGNOSIS — K745 Biliary cirrhosis, unspecified: Secondary | ICD-10-CM

## 2024-03-09 DIAGNOSIS — G4733 Obstructive sleep apnea (adult) (pediatric): Secondary | ICD-10-CM | POA: Insufficient documentation

## 2024-03-09 NOTE — Progress Notes (Signed)
 6

## 2024-03-09 NOTE — Progress Notes (Signed)
 Established Patient Office Visit  Subjective   Patient ID: Christina Castro, female    DOB: Jun 24, 1970  Age: 53 y.o. MRN: [id number]  Chief Complaint  Patient presents with   Follow-up    Follow-up    F/u as above.  Please see last note for details.  Ongoing struggles with weight loss.  Her insurance declined to cover the Wegovy .  I referred her to GI at her last visit, and this referral is pending.  Her OSA is untreated and frustrating for her.  After some discussion, she would like to reconsider CPAP through a home health agency and doesn't want to see a sleep specialist.  She had a recent Mammogram and was commended.    Past Medical History:  Diagnosis Date   Cirrhosis (HCC)    known to GI at [facility]   Colonoscopy refused    GI referral pending   Depression    S/P losing my daughter d/t rejection post lung transplant   GERD (gastroesophageal reflux disease)    Morbid obesity (HCC)    OSA (obstructive sleep apnea)    with CPAP declined   Osteoarthritis    Venous insufficiency     Outpatient Encounter Medications as of 03/09/2024  Medication Sig   furosemide  (LASIX ) 40 MG tablet Take 40 mg by mouth 2 (two) times daily.   Multiple Vitamins-Minerals (WOMENS MULTI GUMMIES PO) Take 1 tablet by mouth daily.   spironolactone (ALDACTONE) 100 MG tablet Take 100 mg by mouth daily.   [DISCONTINUED] semaglutide -weight management (WEGOVY ) 0.25 MG/0.5ML SOAJ SQ injection Inject 0.25 mg into the skin once a week.   CINNAMON PO Take 1 tablet by mouth 2 (two) times daily. (Patient not taking: Reported on 02/10/2024)   ciprofloxacin -dexamethasone  (CIPRODEX ) OTIC suspension Place 4 drops into the left ear 2 (two) times daily. (Patient not taking: Reported on 02/10/2024)   ergocalciferol  (VITAMIN D2) 1.25 MG (50000 UT) capsule Take 50,000 Units by mouth every Monday. (Patient not taking: Reported on 02/10/2024)   Ginger, Zingiber officinalis, (GINGER ROOT PO) Take 1 tablet by mouth 2 (two) times  daily. (Patient not taking: Reported on 02/10/2024)   pantoprazole  (PROTONIX ) 40 MG tablet TAKE 1 TABLET BY MOUTH EVERY DAY (Patient not taking: Reported on 02/10/2024)   phentermine  (ADIPEX-P ) 37.5 MG tablet Take 1 tablet by mouth daily (Patient not taking: Reported on 02/10/2024)   phentermine  (ADIPEX-P ) 37.5 MG tablet Take 1 tablet by mouth daily (Patient not taking: Reported on 02/10/2024)   phentermine  (ADIPEX-P ) 37.5 MG tablet Take 1 tablet by mouth daily (Patient not taking: Reported on 02/10/2024)   phentermine  (ADIPEX-P ) 37.5 MG tablet Take 1 tablet (37.5 mg total) by mouth daily. (Patient not taking: Reported on 02/10/2024)   [DISCONTINUED] sertraline  (ZOLOFT ) 50 MG tablet Take 50 mg by mouth daily.    No facility-administered encounter medications on file as of 03/09/2024.    Social History   Tobacco Use   Smoking status: Never   Smokeless tobacco: Never  Vaping Use   Vaping status: Never Used  Substance Use Topics   Alcohol use: Yes    Comment: 09/11/2013 glass of wine/month maybe   Drug use: No      Review of Systems  Constitutional:  Positive for malaise/fatigue. Negative for diaphoresis, fever and weight loss.  Respiratory:  Negative for cough, shortness of breath and wheezing.   Cardiovascular:  Positive for leg swelling. Negative for chest pain, palpitations, orthopnea, claudication and PND.      Objective:  BP 127/82 (Cuff Size: Large)   Pulse 70   Temp 97.6 F (36.4 C) (Oral)   Resp 16   Wt (!) 316 lb 12.8 oz (143.7 kg)   LMP 08/07/2013   SpO2 97%   BMI 56.13 kg/m    Physical Exam Constitutional:      General: She is not in acute distress.    Appearance: Normal appearance. She is obese.  HENT:     Head: Normocephalic.  Neck:     Vascular: No carotid bruit.  Cardiovascular:     Rate and Rhythm: Normal rate and regular rhythm.     Pulses: Normal pulses.     Heart sounds: Normal heart sounds.  Pulmonary:     Effort: Pulmonary effort is normal.      Breath sounds: Normal breath sounds.  Abdominal:     General: Bowel sounds are normal.     Palpations: Abdomen is soft.  Musculoskeletal:     Cervical back: Neck supple. No tenderness.     Right lower leg: No edema.     Left lower leg: No edema.  Neurological:     Mental Status: She is alert.      No results found for any visits on 03/09/24.    The 10-year ASCVD risk score (Arnett DK, et al., 2019) is: 2.2%    Assessment & Plan:  OSA (obstructive sleep apnea) Assessment & Plan: Repeat home study ordered.  I certainly suspect she would benefit from CPAP.  Orders: -     Ambulatory referral to Sleep Studies  Morbid obesity (HCC) Assessment & Plan: Continue weight loss efforts.  Try a GLP-1 again next year when perhaps more affordable.  Eventually consider additional Cardiac testing after other more pressing issues have been addressed.   Biliary cirrhosis (HCC) Assessment & Plan: F/u with GI as directed.   I personally spent a total of 25 minutes in the care of the patient today including performing a medically appropriate exam/evaluation, counseling and educating, documenting clinical information in the EHR, and communicating results.   Return in about 3 months (around 06/09/2024) for chronic follow-up.    REDDING PONCE NORLEEN FALCON., MD

## 2024-03-09 NOTE — Assessment & Plan Note (Signed)
 F/u with GI as directed

## 2024-03-09 NOTE — Telephone Encounter (Signed)
 Pharmacy Patient Advocate Encounter  Received notification from [facility] that Prior Authorization appeal for Wegovy  has been DENIED.  Full denial letter will be uploaded to the media tab. See denial reason below.

## 2024-03-09 NOTE — Assessment & Plan Note (Signed)
 Repeat home study ordered.  I certainly suspect she would benefit from CPAP.

## 2024-03-09 NOTE — Assessment & Plan Note (Addendum)
 Continue weight loss efforts.  Try a GLP-1 again next year when perhaps more affordable.  Eventually consider additional Cardiac testing after other more pressing issues have been addressed.
